# Patient Record
Sex: Female | Born: 1935 | Race: Black or African American | Hispanic: No | Marital: Married | Smoking: Former smoker
Health system: Southern US, Community
[De-identification: ages and names within clinical notes are randomized; demographics above are authoritative.]

## PROBLEM LIST (undated history)

## (undated) DIAGNOSIS — D696 Thrombocytopenia, unspecified: Secondary | ICD-10-CM

## (undated) DIAGNOSIS — I10 Essential (primary) hypertension: Secondary | ICD-10-CM

## (undated) DIAGNOSIS — D649 Anemia, unspecified: Secondary | ICD-10-CM

## (undated) DIAGNOSIS — I4891 Unspecified atrial fibrillation: Secondary | ICD-10-CM

## (undated) DIAGNOSIS — H409 Unspecified glaucoma: Secondary | ICD-10-CM

## (undated) DIAGNOSIS — D72819 Decreased white blood cell count, unspecified: Secondary | ICD-10-CM

## (undated) DIAGNOSIS — I25810 Atherosclerosis of coronary artery bypass graft(s) without angina pectoris: Secondary | ICD-10-CM

## (undated) DIAGNOSIS — Z90710 Acquired absence of both cervix and uterus: Secondary | ICD-10-CM

## (undated) DIAGNOSIS — Z9049 Acquired absence of other specified parts of digestive tract: Secondary | ICD-10-CM

## (undated) DIAGNOSIS — G20 Parkinson's disease: Secondary | ICD-10-CM

## (undated) DIAGNOSIS — E7800 Pure hypercholesterolemia, unspecified: Secondary | ICD-10-CM

## (undated) DIAGNOSIS — G20A1 Parkinson's disease without dyskinesia, without mention of fluctuations: Secondary | ICD-10-CM

## (undated) DIAGNOSIS — E039 Hypothyroidism, unspecified: Secondary | ICD-10-CM

## (undated) DIAGNOSIS — K219 Gastro-esophageal reflux disease without esophagitis: Secondary | ICD-10-CM

## (undated) DIAGNOSIS — E119 Type 2 diabetes mellitus without complications: Secondary | ICD-10-CM

## (undated) DIAGNOSIS — C9590 Leukemia, unspecified not having achieved remission: Secondary | ICD-10-CM

## (undated) DIAGNOSIS — I099 Rheumatic heart disease, unspecified: Secondary | ICD-10-CM

## (undated) HISTORY — DX: Acquired absence of both cervix and uterus: Z90.710

## (undated) HISTORY — DX: Acquired absence of other specified parts of digestive tract: Z90.49

## (undated) HISTORY — DX: Hypothyroidism, unspecified: E03.9

## (undated) HISTORY — PX: APPENDECTOMY: SHX54

## (undated) HISTORY — DX: Type 2 diabetes mellitus without complications: E11.9

## (undated) HISTORY — PX: PACEMAKER INSERTION: SHX728

## (undated) HISTORY — PX: ABDOMINAL HYSTERECTOMY: SHX81

## (undated) HISTORY — DX: Essential (primary) hypertension: I10

## (undated) HISTORY — DX: Unspecified atrial fibrillation: I48.91

## (undated) HISTORY — DX: Parkinson's disease: G20

## (undated) HISTORY — DX: Parkinson's disease without dyskinesia, without mention of fluctuations: G20.A1

## (undated) HISTORY — DX: Pure hypercholesterolemia, unspecified: E78.00

## (undated) HISTORY — PX: CARDIAC VALVE REPLACEMENT: SHX585

## (undated) HISTORY — DX: Gastro-esophageal reflux disease without esophagitis: K21.9

## (undated) HISTORY — DX: Unspecified glaucoma: H40.9

## (undated) HISTORY — DX: Atherosclerosis of coronary artery bypass graft(s) without angina pectoris: I25.810

---

## 2004-03-31 ENCOUNTER — Inpatient Hospital Stay: Payer: Self-pay | Admitting: Internal Medicine

## 2005-01-07 ENCOUNTER — Ambulatory Visit: Payer: Self-pay | Admitting: Internal Medicine

## 2005-02-26 ENCOUNTER — Observation Stay: Payer: Self-pay | Admitting: Internal Medicine

## 2006-01-20 ENCOUNTER — Ambulatory Visit: Payer: Self-pay | Admitting: Internal Medicine

## 2007-01-21 ENCOUNTER — Emergency Department: Payer: Self-pay | Admitting: Emergency Medicine

## 2007-01-21 ENCOUNTER — Other Ambulatory Visit: Payer: Self-pay

## 2007-01-25 ENCOUNTER — Ambulatory Visit: Payer: Self-pay | Admitting: Internal Medicine

## 2007-01-31 ENCOUNTER — Ambulatory Visit: Payer: Self-pay | Admitting: Internal Medicine

## 2007-08-04 ENCOUNTER — Ambulatory Visit: Payer: Self-pay | Admitting: Internal Medicine

## 2008-01-10 ENCOUNTER — Observation Stay: Payer: Self-pay | Admitting: Internal Medicine

## 2008-05-01 ENCOUNTER — Ambulatory Visit: Payer: Self-pay | Admitting: Unknown Physician Specialty

## 2008-09-27 ENCOUNTER — Inpatient Hospital Stay: Payer: Self-pay | Admitting: Internal Medicine

## 2008-09-30 ENCOUNTER — Inpatient Hospital Stay: Payer: Self-pay | Admitting: Internal Medicine

## 2009-01-03 ENCOUNTER — Encounter: Payer: Self-pay | Admitting: Internal Medicine

## 2009-01-06 ENCOUNTER — Encounter: Payer: Self-pay | Admitting: Internal Medicine

## 2009-02-06 ENCOUNTER — Encounter: Payer: Self-pay | Admitting: Internal Medicine

## 2009-02-15 ENCOUNTER — Ambulatory Visit: Payer: Self-pay | Admitting: Internal Medicine

## 2009-11-11 ENCOUNTER — Ambulatory Visit: Payer: Self-pay | Admitting: Internal Medicine

## 2010-02-18 ENCOUNTER — Ambulatory Visit: Payer: Self-pay | Admitting: Internal Medicine

## 2010-02-18 ENCOUNTER — Emergency Department: Payer: Self-pay | Admitting: Emergency Medicine

## 2010-03-03 ENCOUNTER — Ambulatory Visit: Payer: Self-pay | Admitting: General Practice

## 2010-03-07 ENCOUNTER — Inpatient Hospital Stay: Payer: Self-pay | Admitting: Internal Medicine

## 2010-03-27 ENCOUNTER — Ambulatory Visit: Payer: Self-pay | Admitting: Family

## 2010-05-08 ENCOUNTER — Ambulatory Visit: Payer: Self-pay | Admitting: Family

## 2010-09-04 ENCOUNTER — Ambulatory Visit: Payer: Self-pay | Admitting: Family

## 2010-09-10 ENCOUNTER — Emergency Department: Payer: Self-pay | Admitting: Emergency Medicine

## 2011-02-26 ENCOUNTER — Inpatient Hospital Stay: Payer: Self-pay | Admitting: Internal Medicine

## 2011-05-21 ENCOUNTER — Ambulatory Visit: Payer: Self-pay | Admitting: Internal Medicine

## 2011-06-03 ENCOUNTER — Ambulatory Visit: Payer: Self-pay | Admitting: Internal Medicine

## 2011-09-02 ENCOUNTER — Emergency Department: Payer: Self-pay | Admitting: *Deleted

## 2011-09-02 LAB — CK TOTAL AND CKMB (NOT AT ARMC): CK-MB: 1.3 ng/mL (ref 0.5–3.6)

## 2011-09-02 LAB — COMPREHENSIVE METABOLIC PANEL
Alkaline Phosphatase: 109 U/L (ref 50–136)
Anion Gap: 10 (ref 7–16)
BUN: 22 mg/dL — ABNORMAL HIGH (ref 7–18)
Calcium, Total: 9.4 mg/dL (ref 8.5–10.1)
Chloride: 105 mmol/L (ref 98–107)
Co2: 27 mmol/L (ref 21–32)
EGFR (African American): 52 — ABNORMAL LOW
Osmolality: 291 (ref 275–301)
Potassium: 3.8 mmol/L (ref 3.5–5.1)
SGOT(AST): 29 U/L (ref 15–37)
SGPT (ALT): 17 U/L
Sodium: 142 mmol/L (ref 136–145)
Total Protein: 8.6 g/dL — ABNORMAL HIGH (ref 6.4–8.2)

## 2011-09-02 LAB — CBC
HCT: 35.4 % (ref 35.0–47.0)
MCHC: 33.6 g/dL (ref 32.0–36.0)
MCV: 90 fL (ref 80–100)
Platelet: 214 10*3/uL (ref 150–440)
RBC: 3.93 10*6/uL (ref 3.80–5.20)
RDW: 17.1 % — ABNORMAL HIGH (ref 11.5–14.5)

## 2011-12-03 ENCOUNTER — Ambulatory Visit: Payer: Self-pay | Admitting: Internal Medicine

## 2012-06-07 ENCOUNTER — Ambulatory Visit: Payer: Self-pay | Admitting: Surgery

## 2012-11-08 ENCOUNTER — Ambulatory Visit: Payer: Self-pay | Admitting: Surgery

## 2013-05-16 ENCOUNTER — Ambulatory Visit: Payer: Self-pay | Admitting: Internal Medicine

## 2013-06-05 ENCOUNTER — Encounter: Payer: Self-pay | Admitting: Neurology

## 2013-06-12 ENCOUNTER — Encounter: Payer: Self-pay | Admitting: Neurology

## 2013-06-12 ENCOUNTER — Ambulatory Visit: Payer: Self-pay | Admitting: Internal Medicine

## 2013-07-04 ENCOUNTER — Inpatient Hospital Stay: Payer: Self-pay | Admitting: Internal Medicine

## 2013-07-04 LAB — CBC
HCT: 34.8 % — ABNORMAL LOW (ref 35.0–47.0)
HGB: 11.1 g/dL — AB (ref 12.0–16.0)
MCH: 29.9 pg (ref 26.0–34.0)
MCHC: 32 g/dL (ref 32.0–36.0)
MCV: 93 fL (ref 80–100)
PLATELETS: 330 10*3/uL (ref 150–440)
RBC: 3.73 10*6/uL — ABNORMAL LOW (ref 3.80–5.20)
RDW: 15.6 % — ABNORMAL HIGH (ref 11.5–14.5)
WBC: 5 10*3/uL (ref 3.6–11.0)

## 2013-07-04 LAB — COMPREHENSIVE METABOLIC PANEL
Albumin: 3.4 g/dL (ref 3.4–5.0)
Alkaline Phosphatase: 108 U/L
Anion Gap: 5 — ABNORMAL LOW (ref 7–16)
BUN: 22 mg/dL — ABNORMAL HIGH (ref 7–18)
Bilirubin,Total: 0.5 mg/dL (ref 0.2–1.0)
Calcium, Total: 9.8 mg/dL (ref 8.5–10.1)
Chloride: 95 mmol/L — ABNORMAL LOW (ref 98–107)
Co2: 34 mmol/L — ABNORMAL HIGH (ref 21–32)
Creatinine: 1.64 mg/dL — ABNORMAL HIGH (ref 0.60–1.30)
GFR CALC AF AMER: 35 — AB
GFR CALC NON AF AMER: 30 — AB
GLUCOSE: 393 mg/dL — AB (ref 65–99)
Osmolality: 288 (ref 275–301)
Potassium: 3.3 mmol/L — ABNORMAL LOW (ref 3.5–5.1)
SGOT(AST): 25 U/L (ref 15–37)
SGPT (ALT): 16 U/L (ref 12–78)
SODIUM: 134 mmol/L — AB (ref 136–145)
TOTAL PROTEIN: 7.7 g/dL (ref 6.4–8.2)

## 2013-07-04 LAB — PRO B NATRIURETIC PEPTIDE: B-Type Natriuretic Peptide: 2609 pg/mL — ABNORMAL HIGH (ref 0–450)

## 2013-07-04 LAB — TROPONIN I: Troponin-I: 0.03 ng/mL

## 2013-07-04 LAB — PROTIME-INR
INR: 3.2
Prothrombin Time: 31.5 secs — ABNORMAL HIGH (ref 11.5–14.7)

## 2013-07-05 LAB — CBC WITH DIFFERENTIAL/PLATELET
BASOS ABS: 0 10*3/uL (ref 0.0–0.1)
Basophil %: 0.2 %
Eosinophil #: 0 10*3/uL (ref 0.0–0.7)
Eosinophil %: 0 %
HCT: 32.7 % — ABNORMAL LOW (ref 35.0–47.0)
HGB: 11 g/dL — ABNORMAL LOW (ref 12.0–16.0)
LYMPHS PCT: 6.2 %
Lymphocyte #: 0.4 10*3/uL — ABNORMAL LOW (ref 1.0–3.6)
MCH: 30.8 pg (ref 26.0–34.0)
MCHC: 33.5 g/dL (ref 32.0–36.0)
MCV: 92 fL (ref 80–100)
Monocyte #: 0.1 x10 3/mm — ABNORMAL LOW (ref 0.2–0.9)
Monocyte %: 0.8 %
NEUTROS ABS: 5.9 10*3/uL (ref 1.4–6.5)
Neutrophil %: 92.8 %
Platelet: 283 10*3/uL (ref 150–440)
RBC: 3.55 10*6/uL — AB (ref 3.80–5.20)
RDW: 15.3 % — ABNORMAL HIGH (ref 11.5–14.5)
WBC: 6.4 10*3/uL (ref 3.6–11.0)

## 2013-07-05 LAB — BASIC METABOLIC PANEL
ANION GAP: 7 (ref 7–16)
BUN: 22 mg/dL — ABNORMAL HIGH (ref 7–18)
Calcium, Total: 9.8 mg/dL (ref 8.5–10.1)
Chloride: 100 mmol/L (ref 98–107)
Co2: 29 mmol/L (ref 21–32)
Creatinine: 1.3 mg/dL (ref 0.60–1.30)
EGFR (African American): 46 — ABNORMAL LOW
EGFR (Non-African Amer.): 40 — ABNORMAL LOW
Glucose: 218 mg/dL — ABNORMAL HIGH (ref 65–99)
OSMOLALITY: 282 (ref 275–301)
Potassium: 3.3 mmol/L — ABNORMAL LOW (ref 3.5–5.1)
Sodium: 136 mmol/L (ref 136–145)

## 2013-07-05 LAB — HEMOGLOBIN A1C: Hemoglobin A1C: 8.8 % — ABNORMAL HIGH (ref 4.2–6.3)

## 2013-07-05 LAB — MAGNESIUM: MAGNESIUM: 1.4 mg/dL — AB

## 2013-07-05 LAB — PROTIME-INR
INR: 3
PROTHROMBIN TIME: 30.2 s — AB (ref 11.5–14.7)

## 2013-07-06 LAB — BASIC METABOLIC PANEL
Anion Gap: 6 — ABNORMAL LOW (ref 7–16)
BUN: 29 mg/dL — ABNORMAL HIGH (ref 7–18)
CHLORIDE: 102 mmol/L (ref 98–107)
Calcium, Total: 10.2 mg/dL — ABNORMAL HIGH (ref 8.5–10.1)
Co2: 30 mmol/L (ref 21–32)
Creatinine: 1.29 mg/dL (ref 0.60–1.30)
EGFR (African American): 47 — ABNORMAL LOW
EGFR (Non-African Amer.): 40 — ABNORMAL LOW
GLUCOSE: 174 mg/dL — AB (ref 65–99)
Osmolality: 286 (ref 275–301)
Potassium: 3.3 mmol/L — ABNORMAL LOW (ref 3.5–5.1)
SODIUM: 138 mmol/L (ref 136–145)

## 2013-07-06 LAB — CBC WITH DIFFERENTIAL/PLATELET
BASOS ABS: 0 10*3/uL (ref 0.0–0.1)
Basophil %: 0.1 %
EOS ABS: 0 10*3/uL (ref 0.0–0.7)
Eosinophil %: 0.1 %
HCT: 32.7 % — ABNORMAL LOW (ref 35.0–47.0)
HGB: 10.9 g/dL — ABNORMAL LOW (ref 12.0–16.0)
LYMPHS ABS: 0.8 10*3/uL — AB (ref 1.0–3.6)
Lymphocyte %: 9.6 %
MCH: 30.8 pg (ref 26.0–34.0)
MCHC: 33.3 g/dL (ref 32.0–36.0)
MCV: 93 fL (ref 80–100)
Monocyte #: 0.4 x10 3/mm (ref 0.2–0.9)
Monocyte %: 5.1 %
NEUTROS PCT: 85.1 %
Neutrophil #: 7.5 10*3/uL — ABNORMAL HIGH (ref 1.4–6.5)
PLATELETS: 284 10*3/uL (ref 150–440)
RBC: 3.53 10*6/uL — AB (ref 3.80–5.20)
RDW: 16 % — AB (ref 11.5–14.5)
WBC: 8.8 10*3/uL (ref 3.6–11.0)

## 2013-07-06 LAB — PROTIME-INR
INR: 3.4
Prothrombin Time: 33.6 secs — ABNORMAL HIGH (ref 11.5–14.7)

## 2013-07-07 LAB — PROTIME-INR
INR: 3.8
Prothrombin Time: 36 secs — ABNORMAL HIGH (ref 11.5–14.7)

## 2013-07-07 LAB — BASIC METABOLIC PANEL
ANION GAP: 4 — AB (ref 7–16)
BUN: 21 mg/dL — ABNORMAL HIGH (ref 7–18)
CALCIUM: 10.4 mg/dL — AB (ref 8.5–10.1)
CHLORIDE: 103 mmol/L (ref 98–107)
Co2: 30 mmol/L (ref 21–32)
Creatinine: 1.18 mg/dL (ref 0.60–1.30)
EGFR (Non-African Amer.): 45 — ABNORMAL LOW
GFR CALC AF AMER: 52 — AB
Glucose: 162 mg/dL — ABNORMAL HIGH (ref 65–99)
Osmolality: 280 (ref 275–301)
POTASSIUM: 3.7 mmol/L (ref 3.5–5.1)
Sodium: 137 mmol/L (ref 136–145)

## 2013-07-09 ENCOUNTER — Encounter: Payer: Self-pay | Admitting: Neurology

## 2013-08-08 ENCOUNTER — Encounter: Payer: Self-pay | Admitting: Neurology

## 2013-08-14 LAB — CBC
HCT: 34.1 % — ABNORMAL LOW (ref 35.0–47.0)
HGB: 11.2 g/dL — ABNORMAL LOW (ref 12.0–16.0)
MCH: 31.4 pg (ref 26.0–34.0)
MCHC: 33 g/dL (ref 32.0–36.0)
MCV: 95 fL (ref 80–100)
PLATELETS: 173 10*3/uL (ref 150–440)
RBC: 3.57 10*6/uL — AB (ref 3.80–5.20)
RDW: 15.3 % — ABNORMAL HIGH (ref 11.5–14.5)
WBC: 4.5 10*3/uL (ref 3.6–11.0)

## 2013-08-14 LAB — COMPREHENSIVE METABOLIC PANEL
ANION GAP: 4 — AB (ref 7–16)
Albumin: 3.7 g/dL (ref 3.4–5.0)
Alkaline Phosphatase: 133 U/L — ABNORMAL HIGH
BUN: 38 mg/dL — ABNORMAL HIGH (ref 7–18)
Bilirubin,Total: 0.7 mg/dL (ref 0.2–1.0)
CHLORIDE: 88 mmol/L — AB (ref 98–107)
CO2: 30 mmol/L (ref 21–32)
CREATININE: 2.38 mg/dL — AB (ref 0.60–1.30)
Calcium, Total: 9.5 mg/dL (ref 8.5–10.1)
EGFR (African American): 22 — ABNORMAL LOW
GFR CALC NON AF AMER: 19 — AB
Glucose: 697 mg/dL (ref 65–99)
Osmolality: 288 (ref 275–301)
Potassium: 4.6 mmol/L (ref 3.5–5.1)
SGOT(AST): 32 U/L (ref 15–37)
SGPT (ALT): 17 U/L (ref 12–78)
Sodium: 122 mmol/L — ABNORMAL LOW (ref 136–145)
TOTAL PROTEIN: 7.3 g/dL (ref 6.4–8.2)

## 2013-08-14 LAB — TROPONIN I: Troponin-I: 0.08 ng/mL — ABNORMAL HIGH

## 2013-08-15 ENCOUNTER — Inpatient Hospital Stay: Payer: Self-pay | Admitting: Internal Medicine

## 2013-08-15 LAB — TROPONIN I
Troponin-I: 0.07 ng/mL — ABNORMAL HIGH
Troponin-I: 0.06 ng/mL — ABNORMAL HIGH

## 2013-08-15 LAB — URINALYSIS, COMPLETE
BACTERIA: NONE SEEN
BILIRUBIN, UR: NEGATIVE
BLOOD: NEGATIVE
Glucose,UR: >=500 mg/dL (ref 0–75)
Ketone: NEGATIVE
Leukocyte Esterase: NEGATIVE
NITRITE: NEGATIVE
PH: 5 (ref 4.5–8.0)
PROTEIN: NEGATIVE
RBC,UR: 4 /HPF (ref 0–5)
Specific Gravity: 1.012 (ref 1.003–1.030)
WBC UR: NONE SEEN /HPF (ref 0–5)

## 2013-08-15 LAB — CK TOTAL AND CKMB (NOT AT ARMC)
CK, TOTAL: 83 U/L
CK, Total: 87 U/L
CK-MB: 6.5 ng/mL — ABNORMAL HIGH (ref 0.5–3.6)
CK-MB: 7.5 ng/mL — AB (ref 0.5–3.6)

## 2013-08-15 LAB — PROTIME-INR
INR: 2.6
Prothrombin Time: 26.8 secs — ABNORMAL HIGH (ref 11.5–14.7)

## 2013-08-15 LAB — SEDIMENTATION RATE: Erythrocyte Sed Rate: 44 mm/hr — ABNORMAL HIGH (ref 0–30)

## 2013-08-15 LAB — CK: CK, Total: 86 U/L

## 2013-08-15 LAB — CK-MB: CK-MB: 6 ng/mL — ABNORMAL HIGH (ref 0.5–3.6)

## 2013-08-15 LAB — APTT: ACTIVATED PTT: 43.9 s — AB (ref 23.6–35.9)

## 2013-08-16 LAB — CBC WITH DIFFERENTIAL/PLATELET
BASOS PCT: 0.3 %
Basophil #: 0 10*3/uL (ref 0.0–0.1)
Eosinophil #: 0.1 10*3/uL (ref 0.0–0.7)
Eosinophil %: 2.6 %
HCT: 33 % — ABNORMAL LOW (ref 35.0–47.0)
HGB: 11.1 g/dL — AB (ref 12.0–16.0)
LYMPHS ABS: 1.1 10*3/uL (ref 1.0–3.6)
LYMPHS PCT: 23.7 %
MCH: 31.2 pg (ref 26.0–34.0)
MCHC: 33.7 g/dL (ref 32.0–36.0)
MCV: 93 fL (ref 80–100)
MONOS PCT: 5.1 %
Monocyte #: 0.2 x10 3/mm (ref 0.2–0.9)
NEUTROS ABS: 3.1 10*3/uL (ref 1.4–6.5)
Neutrophil %: 68.3 %
PLATELETS: 163 10*3/uL (ref 150–440)
RBC: 3.56 10*6/uL — ABNORMAL LOW (ref 3.80–5.20)
RDW: 14.9 % — AB (ref 11.5–14.5)
WBC: 4.6 10*3/uL (ref 3.6–11.0)

## 2013-08-16 LAB — PROTIME-INR
INR: 2.6
Prothrombin Time: 27 secs — ABNORMAL HIGH (ref 11.5–14.7)

## 2013-08-16 LAB — BASIC METABOLIC PANEL
ANION GAP: 4 — AB (ref 7–16)
BUN: 20 mg/dL — AB (ref 7–18)
CALCIUM: 9.6 mg/dL (ref 8.5–10.1)
CO2: 27 mmol/L (ref 21–32)
CREATININE: 1.31 mg/dL — AB (ref 0.60–1.30)
Chloride: 107 mmol/L (ref 98–107)
EGFR (Non-African Amer.): 40 — ABNORMAL LOW
GFR CALC AF AMER: 46 — AB
GLUCOSE: 193 mg/dL — AB (ref 65–99)
Osmolality: 284 (ref 275–301)
Potassium: 3.7 mmol/L (ref 3.5–5.1)
SODIUM: 138 mmol/L (ref 136–145)

## 2013-08-17 LAB — BASIC METABOLIC PANEL
Anion Gap: 4 — ABNORMAL LOW (ref 7–16)
BUN: 16 mg/dL (ref 7–18)
CHLORIDE: 105 mmol/L (ref 98–107)
CREATININE: 1.13 mg/dL (ref 0.60–1.30)
Calcium, Total: 9.6 mg/dL (ref 8.5–10.1)
Co2: 26 mmol/L (ref 21–32)
GFR CALC AF AMER: 55 — AB
GFR CALC NON AF AMER: 48 — AB
GLUCOSE: 209 mg/dL — AB (ref 65–99)
OSMOLALITY: 277 (ref 275–301)
Potassium: 3.6 mmol/L (ref 3.5–5.1)
Sodium: 135 mmol/L — ABNORMAL LOW (ref 136–145)

## 2013-08-17 LAB — PROTIME-INR
INR: 2.1
Prothrombin Time: 23 secs — ABNORMAL HIGH (ref 11.5–14.7)

## 2013-08-17 LAB — URINE CULTURE

## 2013-08-18 LAB — BASIC METABOLIC PANEL
ANION GAP: 5 — AB (ref 7–16)
BUN: 13 mg/dL (ref 7–18)
CO2: 27 mmol/L (ref 21–32)
Calcium, Total: 10.1 mg/dL (ref 8.5–10.1)
Chloride: 107 mmol/L (ref 98–107)
Creatinine: 0.97 mg/dL (ref 0.60–1.30)
EGFR (African American): >60
EGFR (Non-African Amer.): 57 — ABNORMAL LOW
Glucose: 127 mg/dL — ABNORMAL HIGH (ref 65–99)
Osmolality: 279 (ref 275–301)
Potassium: 4.1 mmol/L (ref 3.5–5.1)
SODIUM: 139 mmol/L (ref 136–145)

## 2013-08-18 LAB — PROTIME-INR
INR: 1.7
PROTHROMBIN TIME: 20 s — AB (ref 11.5–14.7)

## 2013-08-19 LAB — CBC WITH DIFFERENTIAL/PLATELET
Basophil #: 0 10*3/uL (ref 0.0–0.1)
Basophil %: 0.4 %
EOS PCT: 4.8 %
Eosinophil #: 0.2 10*3/uL (ref 0.0–0.7)
HCT: 32.6 % — ABNORMAL LOW (ref 35.0–47.0)
HGB: 11 g/dL — AB (ref 12.0–16.0)
Lymphocyte #: 0.6 10*3/uL — ABNORMAL LOW (ref 1.0–3.6)
Lymphocyte %: 18.5 %
MCH: 31.7 pg (ref 26.0–34.0)
MCHC: 33.7 g/dL (ref 32.0–36.0)
MCV: 94 fL (ref 80–100)
Monocyte #: 0.2 x10 3/mm (ref 0.2–0.9)
Monocyte %: 6.8 %
NEUTROS PCT: 69.5 %
Neutrophil #: 2.3 10*3/uL (ref 1.4–6.5)
PLATELETS: 151 10*3/uL (ref 150–440)
RBC: 3.47 10*6/uL — AB (ref 3.80–5.20)
RDW: 15.7 % — AB (ref 11.5–14.5)
WBC: 3.3 10*3/uL — AB (ref 3.6–11.0)

## 2013-08-19 LAB — PROTIME-INR
INR: 1.5
Prothrombin Time: 17.8 secs — ABNORMAL HIGH (ref 11.5–14.7)

## 2013-08-19 LAB — BASIC METABOLIC PANEL
Anion Gap: 7 (ref 7–16)
BUN: 13 mg/dL (ref 7–18)
CHLORIDE: 108 mmol/L — AB (ref 98–107)
Calcium, Total: 9.9 mg/dL (ref 8.5–10.1)
Co2: 24 mmol/L (ref 21–32)
Creatinine: 1.13 mg/dL (ref 0.60–1.30)
EGFR (Non-African Amer.): 48 — ABNORMAL LOW
GFR CALC AF AMER: 55 — AB
Glucose: 89 mg/dL (ref 65–99)
Osmolality: 277 (ref 275–301)
POTASSIUM: 4.3 mmol/L (ref 3.5–5.1)
Sodium: 139 mmol/L (ref 136–145)

## 2013-08-20 LAB — CULTURE, BLOOD (SINGLE)

## 2013-09-05 ENCOUNTER — Observation Stay: Payer: Self-pay | Admitting: Internal Medicine

## 2013-09-05 LAB — BASIC METABOLIC PANEL
Anion Gap: 3 — ABNORMAL LOW (ref 7–16)
BUN: 14 mg/dL (ref 7–18)
CALCIUM: 10.2 mg/dL — AB (ref 8.5–10.1)
CHLORIDE: 108 mmol/L — AB (ref 98–107)
CO2: 26 mmol/L (ref 21–32)
CREATININE: 1.09 mg/dL (ref 0.60–1.30)
EGFR (African American): 58 — ABNORMAL LOW
GFR CALC NON AF AMER: 50 — AB
Glucose: 149 mg/dL — ABNORMAL HIGH (ref 65–99)
Osmolality: 277 (ref 275–301)
Potassium: 4.1 mmol/L (ref 3.5–5.1)
Sodium: 137 mmol/L (ref 136–145)

## 2013-09-05 LAB — PROTIME-INR
INR: 2.4
PROTHROMBIN TIME: 25.7 s — AB (ref 11.5–14.7)

## 2013-09-05 LAB — PRO B NATRIURETIC PEPTIDE: B-TYPE NATIURETIC PEPTID: 2854 pg/mL — AB (ref 0–450)

## 2013-09-05 LAB — CBC
HCT: 34.8 % — ABNORMAL LOW (ref 35.0–47.0)
HGB: 11.4 g/dL — ABNORMAL LOW (ref 12.0–16.0)
MCH: 31.7 pg (ref 26.0–34.0)
MCHC: 32.8 g/dL (ref 32.0–36.0)
MCV: 97 fL (ref 80–100)
PLATELETS: 213 10*3/uL (ref 150–440)
RBC: 3.6 10*6/uL — ABNORMAL LOW (ref 3.80–5.20)
RDW: 16.1 % — ABNORMAL HIGH (ref 11.5–14.5)
WBC: 4.2 10*3/uL (ref 3.6–11.0)

## 2013-09-05 LAB — TROPONIN I
Troponin-I: <0.02 ng/mL
Troponin-I: <0.02 ng/mL
Troponin-I: <0.02 ng/mL

## 2013-09-05 LAB — CK TOTAL AND CKMB (NOT AT ARMC)
CK, TOTAL: 55 U/L
CK, Total: 53 U/L
CK-MB: 1.5 ng/mL (ref 0.5–3.6)
CK-MB: 1.3 ng/mL (ref 0.5–3.6)

## 2013-09-06 LAB — CBC WITH DIFFERENTIAL/PLATELET
Basophil #: 0 10*3/uL (ref 0.0–0.1)
Basophil %: 0.4 %
EOS PCT: 4.9 %
Eosinophil #: 0.2 10*3/uL (ref 0.0–0.7)
HCT: 27.7 % — ABNORMAL LOW (ref 35.0–47.0)
HGB: 9.1 g/dL — ABNORMAL LOW (ref 12.0–16.0)
Lymphocyte #: 0.8 10*3/uL — ABNORMAL LOW (ref 1.0–3.6)
Lymphocyte %: 23.9 %
MCH: 31.2 pg (ref 26.0–34.0)
MCHC: 32.8 g/dL (ref 32.0–36.0)
MCV: 95 fL (ref 80–100)
MONOS PCT: 6.4 %
Monocyte #: 0.2 x10 3/mm (ref 0.2–0.9)
NEUTROS ABS: 2 10*3/uL (ref 1.4–6.5)
NEUTROS PCT: 64.4 %
Platelet: 162 10*3/uL (ref 150–440)
RBC: 2.91 10*6/uL — AB (ref 3.80–5.20)
RDW: 15.6 % — ABNORMAL HIGH (ref 11.5–14.5)
WBC: 3.2 10*3/uL — ABNORMAL LOW (ref 3.6–11.0)

## 2013-09-06 LAB — BASIC METABOLIC PANEL
Anion Gap: 5 — ABNORMAL LOW (ref 7–16)
BUN: 15 mg/dL (ref 7–18)
CREATININE: 1.04 mg/dL (ref 0.60–1.30)
Calcium, Total: 9.7 mg/dL (ref 8.5–10.1)
Chloride: 106 mmol/L (ref 98–107)
Co2: 28 mmol/L (ref 21–32)
EGFR (Non-African Amer.): 53 — ABNORMAL LOW
Glucose: 118 mg/dL — ABNORMAL HIGH (ref 65–99)
OSMOLALITY: 279 (ref 275–301)
POTASSIUM: 3.5 mmol/L (ref 3.5–5.1)
Sodium: 139 mmol/L (ref 136–145)

## 2013-09-06 LAB — PROTIME-INR
INR: 2.3
PROTHROMBIN TIME: 24.9 s — AB (ref 11.5–14.7)

## 2013-09-06 LAB — MAGNESIUM: Magnesium: 1.4 mg/dL — ABNORMAL LOW

## 2013-09-07 LAB — PROTIME-INR
INR: 1.8
Prothrombin Time: 20.7 secs — ABNORMAL HIGH (ref 11.5–14.7)

## 2013-09-08 ENCOUNTER — Inpatient Hospital Stay: Payer: Self-pay | Admitting: Specialist

## 2013-09-08 LAB — COMPREHENSIVE METABOLIC PANEL
ALBUMIN: 3.6 g/dL (ref 3.4–5.0)
ANION GAP: 7 (ref 7–16)
Alkaline Phosphatase: 164 U/L — ABNORMAL HIGH
BILIRUBIN TOTAL: 2.6 mg/dL — AB (ref 0.2–1.0)
BUN: 20 mg/dL — ABNORMAL HIGH (ref 7–18)
CALCIUM: 9.8 mg/dL (ref 8.5–10.1)
CHLORIDE: 104 mmol/L (ref 98–107)
Co2: 26 mmol/L (ref 21–32)
Creatinine: 1.23 mg/dL (ref 0.60–1.30)
EGFR (African American): 50 — ABNORMAL LOW
EGFR (Non-African Amer.): 43 — ABNORMAL LOW
Glucose: 234 mg/dL — ABNORMAL HIGH (ref 65–99)
Osmolality: 284 (ref 275–301)
POTASSIUM: 4 mmol/L (ref 3.5–5.1)
SGOT(AST): 871 U/L — ABNORMAL HIGH (ref 15–37)
SGPT (ALT): 459 U/L — ABNORMAL HIGH (ref 12–78)
SODIUM: 137 mmol/L (ref 136–145)
Total Protein: 8 g/dL (ref 6.4–8.2)

## 2013-09-08 LAB — URINALYSIS, COMPLETE
Bilirubin,UR: NEGATIVE
Blood: NEGATIVE
Hyaline Cast: 2
Ketone: NEGATIVE
LEUKOCYTE ESTERASE: NEGATIVE
NITRITE: NEGATIVE
PH: 5 (ref 4.5–8.0)
RBC,UR: 1 /HPF (ref 0–5)
SPECIFIC GRAVITY: 1.013 (ref 1.003–1.030)
Squamous Epithelial: <1

## 2013-09-08 LAB — CBC
HCT: 33.6 % — ABNORMAL LOW (ref 35.0–47.0)
HGB: 11.1 g/dL — ABNORMAL LOW (ref 12.0–16.0)
MCH: 31.4 pg (ref 26.0–34.0)
MCHC: 33 g/dL (ref 32.0–36.0)
MCV: 95 fL (ref 80–100)
PLATELETS: 190 10*3/uL (ref 150–440)
RBC: 3.53 10*6/uL — ABNORMAL LOW (ref 3.80–5.20)
RDW: 15.5 % — ABNORMAL HIGH (ref 11.5–14.5)
WBC: 5.3 10*3/uL (ref 3.6–11.0)

## 2013-09-08 LAB — TROPONIN I: Troponin-I: <0.02 ng/mL

## 2013-09-08 LAB — PROTIME-INR
INR: 1.8
PROTHROMBIN TIME: 20.8 s — AB (ref 11.5–14.7)

## 2013-09-08 LAB — APTT: ACTIVATED PTT: 45 s — AB (ref 23.6–35.9)

## 2013-09-08 LAB — AMMONIA: Ammonia, Plasma: <10 mcmol/L (ref 11–32)

## 2013-09-09 LAB — PROTIME-INR
INR: 2.3
PROTHROMBIN TIME: 24.6 s — AB (ref 11.5–14.7)

## 2013-09-09 LAB — CBC WITH DIFFERENTIAL/PLATELET
BASOS ABS: 0 10*3/uL (ref 0.0–0.1)
Basophil %: 0.4 %
EOS ABS: 0 10*3/uL (ref 0.0–0.7)
Eosinophil %: 0 %
HCT: 30.6 % — AB (ref 35.0–47.0)
HGB: 10.2 g/dL — ABNORMAL LOW (ref 12.0–16.0)
LYMPHS ABS: 0.6 10*3/uL — AB (ref 1.0–3.6)
Lymphocyte %: 8.7 %
MCH: 31.4 pg (ref 26.0–34.0)
MCHC: 33.2 g/dL (ref 32.0–36.0)
MCV: 95 fL (ref 80–100)
MONO ABS: 0.4 x10 3/mm (ref 0.2–0.9)
Monocyte %: 5.5 %
NEUTROS ABS: 6.4 10*3/uL (ref 1.4–6.5)
Neutrophil %: 85.4 %
Platelet: 171 10*3/uL (ref 150–440)
RBC: 3.23 10*6/uL — AB (ref 3.80–5.20)
RDW: 15.7 % — ABNORMAL HIGH (ref 11.5–14.5)
WBC: 7.5 10*3/uL (ref 3.6–11.0)

## 2013-09-09 LAB — COMPREHENSIVE METABOLIC PANEL
ALK PHOS: 163 U/L — AB
Albumin: 3.2 g/dL — ABNORMAL LOW (ref 3.4–5.0)
Anion Gap: 7 (ref 7–16)
BUN: 24 mg/dL — ABNORMAL HIGH (ref 7–18)
Bilirubin,Total: 1.8 mg/dL — ABNORMAL HIGH (ref 0.2–1.0)
CHLORIDE: 105 mmol/L (ref 98–107)
CO2: 27 mmol/L (ref 21–32)
CREATININE: 1.41 mg/dL — AB (ref 0.60–1.30)
Calcium, Total: 9.8 mg/dL (ref 8.5–10.1)
EGFR (African American): 42 — ABNORMAL LOW
EGFR (Non-African Amer.): 36 — ABNORMAL LOW
GLUCOSE: 217 mg/dL — AB (ref 65–99)
Osmolality: 288 (ref 275–301)
Potassium: 3.8 mmol/L (ref 3.5–5.1)
SGOT(AST): 731 U/L — ABNORMAL HIGH (ref 15–37)
SGPT (ALT): 523 U/L — ABNORMAL HIGH (ref 12–78)
Sodium: 139 mmol/L (ref 136–145)
Total Protein: 7.4 g/dL (ref 6.4–8.2)

## 2013-09-09 LAB — APTT
ACTIVATED PTT: 94.3 s — AB (ref 23.6–35.9)
Activated PTT: >160 secs (ref 23.6–35.9)

## 2013-09-10 LAB — MAGNESIUM: MAGNESIUM: 1.6 mg/dL — AB

## 2013-09-10 LAB — PROTIME-INR
INR: 1.8
INR: 1.7
PROTHROMBIN TIME: 19.7 s — AB (ref 11.5–14.7)
Prothrombin Time: 20.5 secs — ABNORMAL HIGH (ref 11.5–14.7)

## 2013-09-10 LAB — CBC WITH DIFFERENTIAL/PLATELET
Basophil #: 0 10*3/uL (ref 0.0–0.1)
Basophil %: 0.3 %
EOS PCT: 0.4 %
Eosinophil #: 0 10*3/uL (ref 0.0–0.7)
HCT: 25.6 % — ABNORMAL LOW (ref 35.0–47.0)
HGB: 8.5 g/dL — ABNORMAL LOW (ref 12.0–16.0)
Lymphocyte #: 0.3 10*3/uL — ABNORMAL LOW (ref 1.0–3.6)
Lymphocyte %: 7.9 %
MCH: 31.6 pg (ref 26.0–34.0)
MCHC: 33.2 g/dL (ref 32.0–36.0)
MCV: 95 fL (ref 80–100)
Monocyte #: 0.1 x10 3/mm — ABNORMAL LOW (ref 0.2–0.9)
Monocyte %: 3.4 %
Neutrophil #: 3.3 10*3/uL (ref 1.4–6.5)
Neutrophil %: 88 %
Platelet: 123 10*3/uL — ABNORMAL LOW (ref 150–440)
RBC: 2.7 10*6/uL — ABNORMAL LOW (ref 3.80–5.20)
RDW: 16.3 % — ABNORMAL HIGH (ref 11.5–14.5)
WBC: 3.7 10*3/uL (ref 3.6–11.0)

## 2013-09-10 LAB — COMPREHENSIVE METABOLIC PANEL
ALT: 259 U/L — AB (ref 12–78)
ANION GAP: 6 — AB (ref 7–16)
AST: 147 U/L — AB (ref 15–37)
Albumin: 2.7 g/dL — ABNORMAL LOW (ref 3.4–5.0)
Alkaline Phosphatase: 108 U/L
BUN: 27 mg/dL — ABNORMAL HIGH (ref 7–18)
Bilirubin,Total: 1.1 mg/dL — ABNORMAL HIGH (ref 0.2–1.0)
CREATININE: 1.57 mg/dL — AB (ref 0.60–1.30)
Calcium, Total: 8.6 mg/dL (ref 8.5–10.1)
Chloride: 109 mmol/L — ABNORMAL HIGH (ref 98–107)
Co2: 27 mmol/L (ref 21–32)
EGFR (African American): 37 — ABNORMAL LOW
EGFR (Non-African Amer.): 32 — ABNORMAL LOW
Glucose: 99 mg/dL (ref 65–99)
Osmolality: 288 (ref 275–301)
Potassium: 2.9 mmol/L — ABNORMAL LOW (ref 3.5–5.1)
Sodium: 142 mmol/L (ref 136–145)
Total Protein: 6.4 g/dL (ref 6.4–8.2)

## 2013-09-10 LAB — POTASSIUM: Potassium: 3.2 mmol/L — ABNORMAL LOW (ref 3.5–5.1)

## 2013-09-10 LAB — HEMATOCRIT: HCT: 25.7 % — ABNORMAL LOW (ref 35.0–47.0)

## 2013-09-10 LAB — APTT: Activated PTT: 76.2 secs — ABNORMAL HIGH (ref 23.6–35.9)

## 2013-09-11 LAB — CBC WITH DIFFERENTIAL/PLATELET
BASOS PCT: 0.5 %
Basophil #: 0 10*3/uL (ref 0.0–0.1)
EOS ABS: 0 10*3/uL (ref 0.0–0.7)
EOS PCT: 1.4 %
HCT: 24.6 % — ABNORMAL LOW (ref 35.0–47.0)
HGB: 8.2 g/dL — AB (ref 12.0–16.0)
LYMPHS ABS: 0.4 10*3/uL — AB (ref 1.0–3.6)
Lymphocyte %: 12.7 %
MCH: 31.8 pg (ref 26.0–34.0)
MCHC: 33.2 g/dL (ref 32.0–36.0)
MCV: 96 fL (ref 80–100)
Monocyte #: 0.2 x10 3/mm (ref 0.2–0.9)
Monocyte %: 6.4 %
NEUTROS PCT: 79 %
Neutrophil #: 2.2 10*3/uL (ref 1.4–6.5)
Platelet: 132 10*3/uL — ABNORMAL LOW (ref 150–440)
RBC: 2.57 10*6/uL — ABNORMAL LOW (ref 3.80–5.20)
RDW: 15.5 % — AB (ref 11.5–14.5)
WBC: 2.8 10*3/uL — ABNORMAL LOW (ref 3.6–11.0)

## 2013-09-11 LAB — COMPREHENSIVE METABOLIC PANEL
ALBUMIN: 2.4 g/dL — AB (ref 3.4–5.0)
ANION GAP: 5 — AB (ref 7–16)
AST: 73 U/L — AB (ref 15–37)
Alkaline Phosphatase: 90 U/L
BUN: 21 mg/dL — ABNORMAL HIGH (ref 7–18)
Bilirubin,Total: 0.7 mg/dL (ref 0.2–1.0)
Calcium, Total: 8.5 mg/dL (ref 8.5–10.1)
Chloride: 115 mmol/L — ABNORMAL HIGH (ref 98–107)
Co2: 25 mmol/L (ref 21–32)
Creatinine: 1.24 mg/dL (ref 0.60–1.30)
EGFR (African American): 49 — ABNORMAL LOW
EGFR (Non-African Amer.): 42 — ABNORMAL LOW
Glucose: 78 mg/dL (ref 65–99)
Osmolality: 291 (ref 275–301)
Potassium: 3.4 mmol/L — ABNORMAL LOW (ref 3.5–5.1)
SGPT (ALT): 182 U/L — ABNORMAL HIGH (ref 12–78)
Sodium: 145 mmol/L (ref 136–145)
Total Protein: 6.1 g/dL — ABNORMAL LOW (ref 6.4–8.2)

## 2013-09-11 LAB — APTT
Activated PTT: 54 secs — ABNORMAL HIGH (ref 23.6–35.9)
Activated PTT: 55.9 secs — ABNORMAL HIGH (ref 23.6–35.9)
Activated PTT: 65.6 secs — ABNORMAL HIGH (ref 23.6–35.9)

## 2013-09-11 LAB — PROTIME-INR
INR: 1.5
Prothrombin Time: 18.1 secs — ABNORMAL HIGH (ref 11.5–14.7)

## 2013-09-11 LAB — MAGNESIUM: Magnesium: 2.2 mg/dL

## 2013-09-11 LAB — TSH: THYROID STIMULATING HORM: 0.325 u[IU]/mL — AB

## 2013-09-11 LAB — PHOSPHORUS: PHOSPHORUS: 1.9 mg/dL — AB (ref 2.5–4.9)

## 2013-09-12 LAB — CBC WITH DIFFERENTIAL/PLATELET
BASOS PCT: 0.4 %
Basophil #: 0 10*3/uL (ref 0.0–0.1)
EOS PCT: 5.3 %
Eosinophil #: 0.2 10*3/uL (ref 0.0–0.7)
HCT: 24.6 % — ABNORMAL LOW (ref 35.0–47.0)
HGB: 8.1 g/dL — ABNORMAL LOW (ref 12.0–16.0)
Lymphocyte #: 0.6 10*3/uL — ABNORMAL LOW (ref 1.0–3.6)
Lymphocyte %: 17.9 %
MCH: 31.5 pg (ref 26.0–34.0)
MCHC: 33 g/dL (ref 32.0–36.0)
MCV: 95 fL (ref 80–100)
MONO ABS: 0.3 x10 3/mm (ref 0.2–0.9)
Monocyte %: 8.9 %
Neutrophil #: 2.1 10*3/uL (ref 1.4–6.5)
Neutrophil %: 67.5 %
PLATELETS: 159 10*3/uL (ref 150–440)
RBC: 2.58 10*6/uL — ABNORMAL LOW (ref 3.80–5.20)
RDW: 16 % — ABNORMAL HIGH (ref 11.5–14.5)
WBC: 3.2 10*3/uL — ABNORMAL LOW (ref 3.6–11.0)

## 2013-09-12 LAB — BASIC METABOLIC PANEL
Anion Gap: 5 — ABNORMAL LOW (ref 7–16)
BUN: 18 mg/dL (ref 7–18)
CHLORIDE: 114 mmol/L — AB (ref 98–107)
Calcium, Total: 8.5 mg/dL (ref 8.5–10.1)
Co2: 23 mmol/L (ref 21–32)
Creatinine: 1.17 mg/dL (ref 0.60–1.30)
EGFR (Non-African Amer.): 45 — ABNORMAL LOW
GFR CALC AF AMER: 52 — AB
Glucose: 147 mg/dL — ABNORMAL HIGH (ref 65–99)
OSMOLALITY: 288 (ref 275–301)
Potassium: 4.3 mmol/L (ref 3.5–5.1)
SODIUM: 142 mmol/L (ref 136–145)

## 2013-09-12 LAB — APTT: ACTIVATED PTT: 92.6 s — AB (ref 23.6–35.9)

## 2013-09-12 LAB — CLOSTRIDIUM DIFFICILE(ARMC)

## 2013-09-12 LAB — URINE CULTURE

## 2013-09-13 LAB — COMPREHENSIVE METABOLIC PANEL
ALK PHOS: 81 U/L
AST: 26 U/L (ref 15–37)
Albumin: 2.3 g/dL — ABNORMAL LOW (ref 3.4–5.0)
Anion Gap: 3 — ABNORMAL LOW (ref 7–16)
BUN: 14 mg/dL (ref 7–18)
Bilirubin,Total: 0.4 mg/dL (ref 0.2–1.0)
CO2: 21 mmol/L (ref 21–32)
Calcium, Total: 9 mg/dL (ref 8.5–10.1)
Chloride: 116 mmol/L — ABNORMAL HIGH (ref 98–107)
Creatinine: 0.96 mg/dL (ref 0.60–1.30)
EGFR (Non-African Amer.): 57 — ABNORMAL LOW
GLUCOSE: 108 mg/dL — AB (ref 65–99)
Osmolality: 280 (ref 275–301)
Potassium: 5 mmol/L (ref 3.5–5.1)
SGPT (ALT): 98 U/L — ABNORMAL HIGH (ref 12–78)
Sodium: 140 mmol/L (ref 136–145)
TOTAL PROTEIN: 6.1 g/dL — AB (ref 6.4–8.2)

## 2013-09-13 LAB — CBC WITH DIFFERENTIAL/PLATELET
Basophil #: 0 10*3/uL (ref 0.0–0.1)
Basophil %: 0.4 %
EOS PCT: 4.9 %
Eosinophil #: 0.1 10*3/uL (ref 0.0–0.7)
HCT: 23.7 % — AB (ref 35.0–47.0)
HGB: 7.9 g/dL — ABNORMAL LOW (ref 12.0–16.0)
Lymphocyte #: 0.5 10*3/uL — ABNORMAL LOW (ref 1.0–3.6)
Lymphocyte %: 18.7 %
MCH: 31.9 pg (ref 26.0–34.0)
MCHC: 33.4 g/dL (ref 32.0–36.0)
MCV: 96 fL (ref 80–100)
MONO ABS: 0.3 x10 3/mm (ref 0.2–0.9)
Monocyte %: 9.3 %
NEUTROS ABS: 1.8 10*3/uL (ref 1.4–6.5)
Neutrophil %: 66.7 %
PLATELETS: 161 10*3/uL (ref 150–440)
RBC: 2.48 10*6/uL — ABNORMAL LOW (ref 3.80–5.20)
RDW: 16.2 % — AB (ref 11.5–14.5)
WBC: 2.7 10*3/uL — AB (ref 3.6–11.0)

## 2013-09-13 LAB — PROTIME-INR
INR: 1.6
PROTHROMBIN TIME: 18.3 s — AB (ref 11.5–14.7)

## 2013-09-13 LAB — CULTURE, BLOOD (SINGLE)

## 2013-09-14 LAB — PROTIME-INR
INR: 1.7
PROTHROMBIN TIME: 19.4 s — AB (ref 11.5–14.7)

## 2013-09-14 LAB — BASIC METABOLIC PANEL
Anion Gap: 3 — ABNORMAL LOW (ref 7–16)
BUN: 10 mg/dL (ref 7–18)
CHLORIDE: 115 mmol/L — AB (ref 98–107)
CREATININE: 1.01 mg/dL (ref 0.60–1.30)
Calcium, Total: 9.5 mg/dL (ref 8.5–10.1)
Co2: 23 mmol/L (ref 21–32)
EGFR (African American): >60
EGFR (Non-African Amer.): 54 — ABNORMAL LOW
Glucose: 124 mg/dL — ABNORMAL HIGH (ref 65–99)
OSMOLALITY: 282 (ref 275–301)
POTASSIUM: 4.6 mmol/L (ref 3.5–5.1)
Sodium: 141 mmol/L (ref 136–145)

## 2013-09-14 LAB — HEMATOCRIT: HCT: 23.2 % — AB (ref 35.0–47.0)

## 2013-09-15 LAB — CBC WITH DIFFERENTIAL/PLATELET
BASOS ABS: 0 10*3/uL (ref 0.0–0.1)
BASOS PCT: 0.5 %
EOS PCT: 5.5 %
Eosinophil #: 0.2 10*3/uL (ref 0.0–0.7)
HCT: 28.8 % — ABNORMAL LOW (ref 35.0–47.0)
HGB: 9.1 g/dL — ABNORMAL LOW (ref 12.0–16.0)
LYMPHS PCT: 16.6 %
Lymphocyte #: 0.5 10*3/uL — ABNORMAL LOW (ref 1.0–3.6)
MCH: 29.7 pg (ref 26.0–34.0)
MCHC: 31.6 g/dL — ABNORMAL LOW (ref 32.0–36.0)
MCV: 94 fL (ref 80–100)
MONO ABS: 0.3 x10 3/mm (ref 0.2–0.9)
MONOS PCT: 8.3 %
Neutrophil #: 2.3 10*3/uL (ref 1.4–6.5)
Neutrophil %: 69.1 %
Platelet: 233 10*3/uL (ref 150–440)
RBC: 3.07 10*6/uL — AB (ref 3.80–5.20)
RDW: 16.4 % — AB (ref 11.5–14.5)
WBC: 3.3 10*3/uL — ABNORMAL LOW (ref 3.6–11.0)

## 2013-09-15 LAB — PROTIME-INR
INR: 1.8
PROTHROMBIN TIME: 20.9 s — AB (ref 11.5–14.7)

## 2013-09-17 LAB — CULTURE, BLOOD (SINGLE)

## 2013-09-18 ENCOUNTER — Inpatient Hospital Stay: Payer: Self-pay | Admitting: Internal Medicine

## 2013-09-18 LAB — CBC
HCT: 31.2 % — AB (ref 35.0–47.0)
HGB: 10 g/dL — ABNORMAL LOW (ref 12.0–16.0)
MCH: 29.9 pg (ref 26.0–34.0)
MCHC: 32 g/dL (ref 32.0–36.0)
MCV: 94 fL (ref 80–100)
PLATELETS: 345 10*3/uL (ref 150–440)
RBC: 3.33 10*6/uL — AB (ref 3.80–5.20)
RDW: 16.4 % — AB (ref 11.5–14.5)
WBC: 5.4 10*3/uL (ref 3.6–11.0)

## 2013-09-18 LAB — BASIC METABOLIC PANEL
ANION GAP: 8 (ref 7–16)
BUN: 4 mg/dL — AB (ref 7–18)
Calcium, Total: 9.4 mg/dL (ref 8.5–10.1)
Chloride: 108 mmol/L — ABNORMAL HIGH (ref 98–107)
Co2: 24 mmol/L (ref 21–32)
Creatinine: 0.8 mg/dL (ref 0.60–1.30)
EGFR (African American): >60
EGFR (Non-African Amer.): >60
GLUCOSE: 106 mg/dL — AB (ref 65–99)
Osmolality: 277 (ref 275–301)
POTASSIUM: 3.7 mmol/L (ref 3.5–5.1)
SODIUM: 140 mmol/L (ref 136–145)

## 2013-09-18 LAB — TROPONIN I
TROPONIN-I: 1.4 ng/mL — AB
TROPONIN-I: 1.3 ng/mL — AB
Troponin-I: 0.17 ng/mL — ABNORMAL HIGH

## 2013-09-18 LAB — CK-MB
CK-MB: 4.6 ng/mL — ABNORMAL HIGH (ref 0.5–3.6)
CK-MB: 5.3 ng/mL — ABNORMAL HIGH (ref 0.5–3.6)

## 2013-09-18 LAB — PROTIME-INR
INR: 2.1
Prothrombin Time: 22.7 secs — ABNORMAL HIGH (ref 11.5–14.7)

## 2013-09-18 LAB — TSH: Thyroid Stimulating Horm: 6.11 u[IU]/mL — ABNORMAL HIGH

## 2013-09-18 LAB — MAGNESIUM: MAGNESIUM: 1.7 mg/dL — AB

## 2013-09-19 LAB — CBC WITH DIFFERENTIAL/PLATELET
BASOS PCT: 0.3 %
Basophil #: 0 10*3/uL (ref 0.0–0.1)
Eosinophil #: 0.1 10*3/uL (ref 0.0–0.7)
Eosinophil %: 3 %
HCT: 31.1 % — AB (ref 35.0–47.0)
HGB: 9.8 g/dL — ABNORMAL LOW (ref 12.0–16.0)
LYMPHS PCT: 11.9 %
Lymphocyte #: 0.5 10*3/uL — ABNORMAL LOW (ref 1.0–3.6)
MCH: 29.7 pg (ref 26.0–34.0)
MCHC: 31.7 g/dL — ABNORMAL LOW (ref 32.0–36.0)
MCV: 94 fL (ref 80–100)
MONO ABS: 0.3 x10 3/mm (ref 0.2–0.9)
MONOS PCT: 7.4 %
NEUTROS ABS: 3.3 10*3/uL (ref 1.4–6.5)
Neutrophil %: 77.4 %
Platelet: 334 10*3/uL (ref 150–440)
RBC: 3.31 10*6/uL — AB (ref 3.80–5.20)
RDW: 16.8 % — AB (ref 11.5–14.5)
WBC: 4.2 10*3/uL (ref 3.6–11.0)

## 2013-09-19 LAB — HEPATIC FUNCTION PANEL A (ARMC)
AST: 23 U/L (ref 15–37)
Albumin: 2.6 g/dL — ABNORMAL LOW (ref 3.4–5.0)
Alkaline Phosphatase: 81 U/L
Bilirubin, Direct: 0.1 mg/dL (ref 0.00–0.20)
Bilirubin,Total: 0.4 mg/dL (ref 0.2–1.0)
SGPT (ALT): 33 U/L (ref 12–78)
TOTAL PROTEIN: 6.3 g/dL — AB (ref 6.4–8.2)

## 2013-09-19 LAB — PROTIME-INR
INR: 2.3
Prothrombin Time: 24.4 secs — ABNORMAL HIGH (ref 11.5–14.7)

## 2013-09-19 LAB — BASIC METABOLIC PANEL
Anion Gap: 5 — ABNORMAL LOW (ref 7–16)
BUN: 4 mg/dL — AB (ref 7–18)
CALCIUM: 9.3 mg/dL (ref 8.5–10.1)
Chloride: 109 mmol/L — ABNORMAL HIGH (ref 98–107)
Co2: 27 mmol/L (ref 21–32)
Creatinine: 0.76 mg/dL (ref 0.60–1.30)
EGFR (African American): >60
Glucose: 58 mg/dL — ABNORMAL LOW (ref 65–99)
OSMOLALITY: 276 (ref 275–301)
POTASSIUM: 3.7 mmol/L (ref 3.5–5.1)
Sodium: 141 mmol/L (ref 136–145)

## 2013-09-19 LAB — MAGNESIUM: Magnesium: 2 mg/dL

## 2013-09-20 LAB — HEMATOCRIT: HCT: 28.8 % — ABNORMAL LOW (ref 35.0–47.0)

## 2013-09-20 LAB — PROTIME-INR
INR: 2.5
PROTHROMBIN TIME: 26.2 s — AB (ref 11.5–14.7)

## 2013-09-21 LAB — PROTIME-INR
INR: 2.6
PROTHROMBIN TIME: 27.2 s — AB (ref 11.5–14.7)

## 2013-09-21 LAB — HEMATOCRIT: HCT: 29.5 % — AB (ref 35.0–47.0)

## 2013-09-22 ENCOUNTER — Encounter: Payer: Self-pay | Admitting: Neurology

## 2013-09-22 ENCOUNTER — Encounter: Payer: Self-pay | Admitting: Internal Medicine

## 2013-09-24 LAB — CULTURE, BLOOD (SINGLE)

## 2013-09-26 LAB — CBC WITH DIFFERENTIAL/PLATELET
BASOS PCT: 0.5 %
Basophil #: 0 10*3/uL (ref 0.0–0.1)
EOS ABS: 0.2 10*3/uL (ref 0.0–0.7)
Eosinophil %: 3.3 %
HCT: 33.3 % — AB (ref 35.0–47.0)
HGB: 10.7 g/dL — ABNORMAL LOW (ref 12.0–16.0)
LYMPHS ABS: 0.9 10*3/uL — AB (ref 1.0–3.6)
Lymphocyte %: 18.2 %
MCH: 30.6 pg (ref 26.0–34.0)
MCHC: 32 g/dL (ref 32.0–36.0)
MCV: 96 fL (ref 80–100)
Monocyte #: 0.3 x10 3/mm (ref 0.2–0.9)
Monocyte %: 6.1 %
Neutrophil #: 3.5 10*3/uL (ref 1.4–6.5)
Neutrophil %: 71.9 %
PLATELETS: 317 10*3/uL (ref 150–440)
RBC: 3.49 10*6/uL — AB (ref 3.80–5.20)
RDW: 17.7 % — ABNORMAL HIGH (ref 11.5–14.5)
WBC: 4.9 10*3/uL (ref 3.6–11.0)

## 2013-09-26 LAB — HEMOGLOBIN A1C: HEMOGLOBIN A1C: 9.3 % — AB (ref 4.2–6.3)

## 2013-09-26 LAB — BASIC METABOLIC PANEL
Anion Gap: 4 — ABNORMAL LOW (ref 7–16)
BUN: 8 mg/dL (ref 7–18)
CALCIUM: 10.1 mg/dL (ref 8.5–10.1)
Chloride: 109 mmol/L — ABNORMAL HIGH (ref 98–107)
Co2: 28 mmol/L (ref 21–32)
Creatinine: 0.83 mg/dL (ref 0.60–1.30)
EGFR (African American): >60
GLUCOSE: 88 mg/dL (ref 65–99)
Osmolality: 279 (ref 275–301)
POTASSIUM: 3.9 mmol/L (ref 3.5–5.1)
Sodium: 141 mmol/L (ref 136–145)

## 2013-09-26 LAB — PROTIME-INR
INR: 2
PROTHROMBIN TIME: 22.2 s — AB (ref 11.5–14.7)

## 2013-10-03 LAB — PROTIME-INR
INR: 3.1
PROTHROMBIN TIME: 31 s — AB (ref 11.5–14.7)

## 2013-10-06 ENCOUNTER — Encounter: Payer: Self-pay | Admitting: Internal Medicine

## 2013-10-10 ENCOUNTER — Emergency Department: Payer: Self-pay | Admitting: Emergency Medicine

## 2013-10-10 LAB — PROTIME-INR
INR: 2.6
Prothrombin Time: 26.8 secs — ABNORMAL HIGH (ref 11.5–14.7)

## 2013-10-10 LAB — BASIC METABOLIC PANEL
ANION GAP: 2 — AB (ref 7–16)
BUN: 9 mg/dL (ref 7–18)
Calcium, Total: 10.1 mg/dL (ref 8.5–10.1)
Chloride: 110 mmol/L — ABNORMAL HIGH (ref 98–107)
Co2: 25 mmol/L (ref 21–32)
Creatinine: 0.68 mg/dL (ref 0.60–1.30)
GLUCOSE: 96 mg/dL (ref 65–99)
Osmolality: 272 (ref 275–301)
POTASSIUM: 4 mmol/L (ref 3.5–5.1)
Sodium: 137 mmol/L (ref 136–145)

## 2013-10-10 LAB — CBC
HCT: 35 % (ref 35.0–47.0)
HGB: 11.3 g/dL — AB (ref 12.0–16.0)
MCH: 30.7 pg (ref 26.0–34.0)
MCHC: 32.4 g/dL (ref 32.0–36.0)
MCV: 95 fL (ref 80–100)
Platelet: 228 10*3/uL (ref 150–440)
RBC: 3.68 10*6/uL — ABNORMAL LOW (ref 3.80–5.20)
RDW: 17.8 % — ABNORMAL HIGH (ref 11.5–14.5)
WBC: 3.9 10*3/uL (ref 3.6–11.0)

## 2013-10-10 LAB — TROPONIN I: Troponin-I: <0.02 ng/mL

## 2013-10-10 LAB — MAGNESIUM: Magnesium: 1.6 mg/dL — ABNORMAL LOW

## 2013-10-10 LAB — TSH: Thyroid Stimulating Horm: 0.532 u[IU]/mL

## 2013-10-10 LAB — PRO B NATRIURETIC PEPTIDE: B-Type Natriuretic Peptide: 1705 pg/mL — ABNORMAL HIGH (ref 0–450)

## 2013-10-17 ENCOUNTER — Inpatient Hospital Stay: Payer: Self-pay | Admitting: Internal Medicine

## 2013-10-17 LAB — APTT: ACTIVATED PTT: 35.9 s (ref 23.6–35.9)

## 2013-10-17 LAB — CBC WITH DIFFERENTIAL/PLATELET
BASOS ABS: 0 10*3/uL (ref 0.0–0.1)
Basophil %: 0.3 %
Eosinophil #: 0.1 10*3/uL (ref 0.0–0.7)
Eosinophil %: 2.4 %
HCT: 31.3 % — ABNORMAL LOW (ref 35.0–47.0)
HGB: 10.3 g/dL — ABNORMAL LOW (ref 12.0–16.0)
LYMPHS ABS: 0.6 10*3/uL — AB (ref 1.0–3.6)
Lymphocyte %: 16.3 %
MCH: 31.1 pg (ref 26.0–34.0)
MCHC: 32.8 g/dL (ref 32.0–36.0)
MCV: 95 fL (ref 80–100)
MONO ABS: 0.2 x10 3/mm (ref 0.2–0.9)
Monocyte %: 5.6 %
NEUTROS ABS: 2.7 10*3/uL (ref 1.4–6.5)
NEUTROS PCT: 75.4 %
Platelet: 269 10*3/uL (ref 150–440)
RBC: 3.31 10*6/uL — ABNORMAL LOW (ref 3.80–5.20)
RDW: 16.8 % — ABNORMAL HIGH (ref 11.5–14.5)
WBC: 3.6 10*3/uL (ref 3.6–11.0)

## 2013-10-17 LAB — BASIC METABOLIC PANEL
ANION GAP: 6 — AB (ref 7–16)
BUN: 14 mg/dL (ref 7–18)
CREATININE: 0.98 mg/dL (ref 0.60–1.30)
Calcium, Total: 10 mg/dL (ref 8.5–10.1)
Chloride: 107 mmol/L (ref 98–107)
Co2: 27 mmol/L (ref 21–32)
EGFR (African American): >60
EGFR (Non-African Amer.): 56 — ABNORMAL LOW
Glucose: 170 mg/dL — ABNORMAL HIGH (ref 65–99)
Osmolality: 284 (ref 275–301)
POTASSIUM: 4.2 mmol/L (ref 3.5–5.1)
SODIUM: 140 mmol/L (ref 136–145)

## 2013-10-17 LAB — PROTIME-INR
INR: 1.5
INR: 1.4
PROTHROMBIN TIME: 18.1 s — AB (ref 11.5–14.7)
Prothrombin Time: 17.1 secs — ABNORMAL HIGH (ref 11.5–14.7)

## 2013-10-18 LAB — CBC WITH DIFFERENTIAL/PLATELET
Basophil #: 0 10*3/uL (ref 0.0–0.1)
Basophil %: 0.7 %
EOS ABS: 0.1 10*3/uL (ref 0.0–0.7)
Eosinophil %: 3.6 %
HCT: 27.5 % — ABNORMAL LOW (ref 35.0–47.0)
HGB: 9 g/dL — ABNORMAL LOW (ref 12.0–16.0)
Lymphocyte #: 0.8 10*3/uL — ABNORMAL LOW (ref 1.0–3.6)
Lymphocyte %: 22.3 %
MCH: 30.9 pg (ref 26.0–34.0)
MCHC: 32.8 g/dL (ref 32.0–36.0)
MCV: 94 fL (ref 80–100)
Monocyte #: 0.3 x10 3/mm (ref 0.2–0.9)
Monocyte %: 7.7 %
NEUTROS PCT: 65.7 %
Neutrophil #: 2.4 10*3/uL (ref 1.4–6.5)
Platelet: 238 10*3/uL (ref 150–440)
RBC: 2.92 10*6/uL — ABNORMAL LOW (ref 3.80–5.20)
RDW: 16.9 % — AB (ref 11.5–14.5)
WBC: 3.7 10*3/uL (ref 3.6–11.0)

## 2013-10-18 LAB — BASIC METABOLIC PANEL
ANION GAP: 3 — AB (ref 7–16)
BUN: 16 mg/dL (ref 7–18)
CALCIUM: 9.8 mg/dL (ref 8.5–10.1)
CO2: 27 mmol/L (ref 21–32)
Chloride: 109 mmol/L — ABNORMAL HIGH (ref 98–107)
Creatinine: 1.11 mg/dL (ref 0.60–1.30)
EGFR (African American): 56 — ABNORMAL LOW
GFR CALC NON AF AMER: 48 — AB
GLUCOSE: 89 mg/dL (ref 65–99)
Osmolality: 278 (ref 275–301)
Potassium: 4.1 mmol/L (ref 3.5–5.1)
Sodium: 139 mmol/L (ref 136–145)

## 2013-10-18 LAB — PROTIME-INR
INR: 1.4
Prothrombin Time: 16.9 secs — ABNORMAL HIGH (ref 11.5–14.7)

## 2013-10-18 LAB — APTT
ACTIVATED PTT: 135.2 s — AB (ref 23.6–35.9)
Activated PTT: 73.4 secs — ABNORMAL HIGH (ref 23.6–35.9)

## 2013-10-19 LAB — PROTIME-INR
INR: 1.3
Prothrombin Time: 16 secs — ABNORMAL HIGH (ref 11.5–14.7)

## 2013-10-19 LAB — APTT
ACTIVATED PTT: 36 s — AB (ref 23.6–35.9)
Activated PTT: 70.6 secs — ABNORMAL HIGH (ref 23.6–35.9)

## 2013-10-20 LAB — APTT: Activated PTT: 70.2 secs — ABNORMAL HIGH (ref 23.6–35.9)

## 2013-10-20 LAB — PROTIME-INR
INR: 1.2
PROTHROMBIN TIME: 15.3 s — AB (ref 11.5–14.7)

## 2013-10-21 LAB — APTT
Activated PTT: >160 secs (ref 23.6–35.9)
Activated PTT: 61.3 secs — ABNORMAL HIGH (ref 23.6–35.9)
Activated PTT: 52 secs — ABNORMAL HIGH (ref 23.6–35.9)

## 2013-10-21 LAB — PROTIME-INR
INR: 1.2
Prothrombin Time: 15.2 secs — ABNORMAL HIGH (ref 11.5–14.7)

## 2013-10-21 LAB — HEMOGLOBIN: HGB: 10.2 g/dL — ABNORMAL LOW (ref 12.0–16.0)

## 2013-10-21 LAB — PLATELET COUNT: Platelet: 255 10*3/uL (ref 150–440)

## 2013-10-22 LAB — APTT
ACTIVATED PTT: 80.3 s — AB (ref 23.6–35.9)
Activated PTT: 67.2 secs — ABNORMAL HIGH (ref 23.6–35.9)

## 2013-10-22 LAB — PROTIME-INR
INR: 1.3
Prothrombin Time: 15.7 secs — ABNORMAL HIGH (ref 11.5–14.7)

## 2013-10-23 LAB — PROTIME-INR
INR: 1.5
PROTHROMBIN TIME: 17.8 s — AB (ref 11.5–14.7)

## 2013-10-23 LAB — APTT
Activated PTT: 119.1 secs — ABNORMAL HIGH (ref 23.6–35.9)
Activated PTT: 91.9 secs — ABNORMAL HIGH (ref 23.6–35.9)

## 2013-10-23 LAB — HEMOGLOBIN: HGB: 10.8 g/dL — ABNORMAL LOW (ref 12.0–16.0)

## 2013-10-23 LAB — PLATELET COUNT: PLATELETS: 235 10*3/uL (ref 150–440)

## 2013-10-24 LAB — PROTIME-INR
INR: 1.8
PROTHROMBIN TIME: 20.9 s — AB (ref 11.5–14.7)

## 2013-10-24 LAB — APTT
ACTIVATED PTT: 117.6 s — AB (ref 23.6–35.9)
Activated PTT: 81.9 secs — ABNORMAL HIGH (ref 23.6–35.9)

## 2014-06-08 ENCOUNTER — Ambulatory Visit: Payer: Self-pay | Admitting: Oncology

## 2014-06-10 ENCOUNTER — Inpatient Hospital Stay: Payer: Self-pay | Admitting: Internal Medicine

## 2014-06-10 LAB — BASIC METABOLIC PANEL
Anion Gap: 7 (ref 7–16)
BUN: 20 mg/dL — AB (ref 7–18)
CHLORIDE: 102 mmol/L (ref 98–107)
CREATININE: 1.21 mg/dL (ref 0.60–1.30)
Calcium, Total: 9.1 mg/dL (ref 8.5–10.1)
Co2: 32 mmol/L (ref 21–32)
EGFR (African American): 56 — ABNORMAL LOW
GFR CALC NON AF AMER: 46 — AB
Glucose: 112 mg/dL — ABNORMAL HIGH (ref 65–99)
Osmolality: 285 (ref 275–301)
POTASSIUM: 3.6 mmol/L (ref 3.5–5.1)
SODIUM: 141 mmol/L (ref 136–145)

## 2014-06-10 LAB — CBC WITH DIFFERENTIAL/PLATELET
BASOS ABS: 0 10*3/uL (ref 0.0–0.1)
Basophil %: 0.3 %
EOS ABS: 0.1 10*3/uL (ref 0.0–0.7)
Eosinophil %: 2.2 %
HCT: 24.8 % — AB (ref 35.0–47.0)
HGB: 7.8 g/dL — ABNORMAL LOW (ref 12.0–16.0)
LYMPHS ABS: 0.7 10*3/uL — AB (ref 1.0–3.6)
Lymphocyte %: 22.1 %
MCH: 29.1 pg (ref 26.0–34.0)
MCHC: 31.6 g/dL — AB (ref 32.0–36.0)
MCV: 92 fL (ref 80–100)
MONO ABS: 0.2 x10 3/mm (ref 0.2–0.9)
Monocyte %: 4.9 %
NEUTROS ABS: 2.2 10*3/uL (ref 1.4–6.5)
Neutrophil %: 70.5 %
PLATELETS: 227 10*3/uL (ref 150–440)
RBC: 2.69 10*6/uL — ABNORMAL LOW (ref 3.80–5.20)
RDW: 15.4 % — ABNORMAL HIGH (ref 11.5–14.5)
WBC: 3.1 10*3/uL — AB (ref 3.6–11.0)

## 2014-06-10 LAB — PROTIME-INR
INR: 3.1
Prothrombin Time: 31.3 secs — ABNORMAL HIGH (ref 11.5–14.7)

## 2014-06-10 LAB — URINALYSIS, COMPLETE
BACTERIA: NONE SEEN
Bilirubin,UR: NEGATIVE
Blood: NEGATIVE
Glucose,UR: NEGATIVE mg/dL (ref 0–75)
KETONE: NEGATIVE
Leukocyte Esterase: NEGATIVE
Nitrite: NEGATIVE
Ph: 5 (ref 4.5–8.0)
Protein: NEGATIVE
RBC,UR: <1 /HPF (ref 0–5)
SPECIFIC GRAVITY: 1.013 (ref 1.003–1.030)
Squamous Epithelial: 3
WBC UR: 1 /HPF (ref 0–5)

## 2014-06-10 LAB — TROPONIN I: Troponin-I: <0.02 ng/mL

## 2014-06-11 LAB — BASIC METABOLIC PANEL
ANION GAP: 7 (ref 7–16)
BUN: 20 mg/dL — ABNORMAL HIGH (ref 7–18)
CHLORIDE: 106 mmol/L (ref 98–107)
CO2: 32 mmol/L (ref 21–32)
CREATININE: 0.97 mg/dL (ref 0.60–1.30)
Calcium, Total: 8.9 mg/dL (ref 8.5–10.1)
EGFR (Non-African Amer.): 59 — ABNORMAL LOW
Glucose: 87 mg/dL (ref 65–99)
Osmolality: 291 (ref 275–301)
Potassium: 3.2 mmol/L — ABNORMAL LOW (ref 3.5–5.1)
SODIUM: 145 mmol/L (ref 136–145)

## 2014-06-11 LAB — CBC WITH DIFFERENTIAL/PLATELET
Basophil #: 0 10*3/uL (ref 0.0–0.1)
Basophil %: 0 %
EOS PCT: 1.3 %
Eosinophil #: 0 10*3/uL (ref 0.0–0.7)
HCT: 25 % — ABNORMAL LOW (ref 35.0–47.0)
HGB: 8.1 g/dL — ABNORMAL LOW (ref 12.0–16.0)
LYMPHS ABS: 0.7 10*3/uL — AB (ref 1.0–3.6)
LYMPHS PCT: 25.4 %
MCH: 29.3 pg (ref 26.0–34.0)
MCHC: 32.2 g/dL (ref 32.0–36.0)
MCV: 91 fL (ref 80–100)
MONOS PCT: 7 %
Monocyte #: 0.2 x10 3/mm (ref 0.2–0.9)
NEUTROS ABS: 1.8 10*3/uL (ref 1.4–6.5)
Neutrophil %: 66.3 %
Platelet: 186 10*3/uL (ref 150–440)
RBC: 2.75 10*6/uL — ABNORMAL LOW (ref 3.80–5.20)
RDW: 15.3 % — ABNORMAL HIGH (ref 11.5–14.5)
WBC: 2.7 10*3/uL — AB (ref 3.6–11.0)

## 2014-06-11 LAB — PROTIME-INR
INR: 3.1
PROTHROMBIN TIME: 31.4 s — AB (ref 11.5–14.7)

## 2014-06-11 LAB — MAGNESIUM: Magnesium: 1.8 mg/dL

## 2014-06-12 LAB — CBC WITH DIFFERENTIAL/PLATELET
Basophil #: 0 10*3/uL (ref 0.0–0.1)
Basophil %: 0.3 %
Eosinophil #: 0.1 10*3/uL (ref 0.0–0.7)
Eosinophil %: 4.4 %
HCT: 23.5 % — ABNORMAL LOW (ref 35.0–47.0)
HGB: 7.9 g/dL — ABNORMAL LOW (ref 12.0–16.0)
Lymphocyte #: 0.9 10*3/uL — ABNORMAL LOW (ref 1.0–3.6)
Lymphocyte %: 32.1 %
MCH: 29.9 pg (ref 26.0–34.0)
MCHC: 33.5 g/dL (ref 32.0–36.0)
MCV: 89 fL (ref 80–100)
Monocyte #: 0.1 x10 3/mm — ABNORMAL LOW (ref 0.2–0.9)
Monocyte %: 5.1 %
NEUTROS ABS: 1.7 10*3/uL (ref 1.4–6.5)
Neutrophil %: 58.1 %
Platelet: 191 10*3/uL (ref 150–440)
RBC: 2.64 10*6/uL — ABNORMAL LOW (ref 3.80–5.20)
RDW: 15.9 % — ABNORMAL HIGH (ref 11.5–14.5)
WBC: 2.9 10*3/uL — ABNORMAL LOW (ref 3.6–11.0)

## 2014-06-12 LAB — BASIC METABOLIC PANEL
Anion Gap: 7 (ref 7–16)
BUN: 17 mg/dL (ref 7–18)
CO2: 30 mmol/L (ref 21–32)
CREATININE: 1.16 mg/dL (ref 0.60–1.30)
Calcium, Total: 9 mg/dL (ref 8.5–10.1)
Chloride: 107 mmol/L (ref 98–107)
EGFR (African American): 59 — ABNORMAL LOW
GFR CALC NON AF AMER: 48 — AB
GLUCOSE: 94 mg/dL (ref 65–99)
Osmolality: 288 (ref 275–301)
Potassium: 3.7 mmol/L (ref 3.5–5.1)
Sodium: 144 mmol/L (ref 136–145)

## 2014-06-12 LAB — PROTIME-INR
INR: 3
PROTHROMBIN TIME: 30.5 s — AB (ref 11.5–14.7)

## 2014-06-13 LAB — CBC WITH DIFFERENTIAL/PLATELET
BASOS PCT: 0.2 %
Basophil #: 0 10*3/uL (ref 0.0–0.1)
EOS PCT: 5.6 %
Eosinophil #: 0.1 10*3/uL (ref 0.0–0.7)
HCT: 22.3 % — AB (ref 35.0–47.0)
HGB: 7.3 g/dL — AB (ref 12.0–16.0)
LYMPHS PCT: 31 %
Lymphocyte #: 0.8 10*3/uL — ABNORMAL LOW (ref 1.0–3.6)
MCH: 29.5 pg (ref 26.0–34.0)
MCHC: 32.5 g/dL (ref 32.0–36.0)
MCV: 91 fL (ref 80–100)
Monocyte #: 0.1 x10 3/mm — ABNORMAL LOW (ref 0.2–0.9)
Monocyte %: 5.2 %
NEUTROS ABS: 1.4 10*3/uL (ref 1.4–6.5)
Neutrophil %: 58 %
Platelet: 173 10*3/uL (ref 150–440)
RBC: 2.46 10*6/uL — ABNORMAL LOW (ref 3.80–5.20)
RDW: 15.5 % — ABNORMAL HIGH (ref 11.5–14.5)
WBC: 2.5 10*3/uL — ABNORMAL LOW (ref 3.6–11.0)

## 2014-06-13 LAB — PROTIME-INR
INR: 2
PROTHROMBIN TIME: 22.5 s — AB (ref 11.5–14.7)

## 2014-06-14 LAB — CBC WITH DIFFERENTIAL/PLATELET
BASOS ABS: 0 10*3/uL (ref 0.0–0.1)
Basophil %: 0.4 %
Eosinophil #: 0.1 10*3/uL (ref 0.0–0.7)
Eosinophil %: 4.9 %
HCT: 23.8 % — AB (ref 35.0–47.0)
HGB: 7.7 g/dL — ABNORMAL LOW (ref 12.0–16.0)
LYMPHS ABS: 0.6 10*3/uL — AB (ref 1.0–3.6)
Lymphocyte %: 18.8 %
MCH: 29.8 pg (ref 26.0–34.0)
MCHC: 32.5 g/dL (ref 32.0–36.0)
MCV: 92 fL (ref 80–100)
MONO ABS: 0.1 x10 3/mm — AB (ref 0.2–0.9)
MONOS PCT: 3.3 %
Neutrophil #: 2.1 10*3/uL (ref 1.4–6.5)
Neutrophil %: 72.6 %
Platelet: 206 10*3/uL (ref 150–440)
RBC: 2.59 10*6/uL — ABNORMAL LOW (ref 3.80–5.20)
RDW: 15 % — AB (ref 11.5–14.5)
WBC: 2.9 10*3/uL — AB (ref 3.6–11.0)

## 2014-06-15 LAB — CBC WITH DIFFERENTIAL/PLATELET
BASOS PCT: 0.3 %
Basophil #: 0 10*3/uL (ref 0.0–0.1)
EOS ABS: 0.1 10*3/uL (ref 0.0–0.7)
Eosinophil %: 4.1 %
HCT: 20.6 % — ABNORMAL LOW (ref 35.0–47.0)
HGB: 6.6 g/dL — ABNORMAL LOW (ref 12.0–16.0)
Lymphocyte #: 0.7 10*3/uL — ABNORMAL LOW (ref 1.0–3.6)
Lymphocyte %: 28.5 %
MCH: 29.1 pg (ref 26.0–34.0)
MCHC: 32 g/dL (ref 32.0–36.0)
MCV: 91 fL (ref 80–100)
MONO ABS: 0.1 x10 3/mm — AB (ref 0.2–0.9)
Monocyte %: 4.7 %
NEUTROS PCT: 62.4 %
Neutrophil #: 1.4 10*3/uL (ref 1.4–6.5)
Platelet: 154 10*3/uL (ref 150–440)
RBC: 2.27 10*6/uL — ABNORMAL LOW (ref 3.80–5.20)
RDW: 15.3 % — ABNORMAL HIGH (ref 11.5–14.5)
WBC: 2.3 10*3/uL — AB (ref 3.6–11.0)

## 2014-06-15 LAB — TROPONIN I: Troponin-I: 0.02 ng/mL

## 2014-06-15 LAB — PROTIME-INR
INR: 1.5
PROTHROMBIN TIME: 17.7 s — AB (ref 11.5–14.7)

## 2014-06-15 LAB — HEMOGLOBIN: HGB: 8.1 g/dL — ABNORMAL LOW (ref 12.0–16.0)

## 2014-06-15 LAB — RAPID HIV SCREEN (HIV 1/2 AB+AG)

## 2014-06-16 LAB — CBC WITH DIFFERENTIAL/PLATELET
Basophil #: 0 10*3/uL (ref 0.0–0.1)
Basophil %: 0.2 %
EOS ABS: 0.1 10*3/uL (ref 0.0–0.7)
Eosinophil %: 4.3 %
HCT: 23.2 % — ABNORMAL LOW (ref 35.0–47.0)
HGB: 7.6 g/dL — ABNORMAL LOW (ref 12.0–16.0)
Lymphocyte #: 0.9 10*3/uL — ABNORMAL LOW (ref 1.0–3.6)
Lymphocyte %: 35.5 %
MCH: 28.7 pg (ref 26.0–34.0)
MCHC: 32.6 g/dL (ref 32.0–36.0)
MCV: 88 fL (ref 80–100)
Monocyte #: 0.1 x10 3/mm — ABNORMAL LOW (ref 0.2–0.9)
Monocyte %: 4.5 %
NEUTROS PCT: 55.5 %
Neutrophil #: 1.4 10*3/uL (ref 1.4–6.5)
PLATELETS: 151 10*3/uL (ref 150–440)
RBC: 2.64 10*6/uL — AB (ref 3.80–5.20)
RDW: 16.6 % — ABNORMAL HIGH (ref 11.5–14.5)
WBC: 2.5 10*3/uL — ABNORMAL LOW (ref 3.6–11.0)

## 2014-06-16 LAB — CREATININE, SERUM
Creatinine: 1.09 mg/dL (ref 0.60–1.30)
EGFR (African American): >60
EGFR (Non-African Amer.): 52 — ABNORMAL LOW

## 2014-06-16 LAB — LACTATE DEHYDROGENASE: LDH: 167 U/L (ref 81–246)

## 2014-06-16 LAB — PROTIME-INR
INR: 1.6
PROTHROMBIN TIME: 18.3 s — AB (ref 11.5–14.7)

## 2014-06-16 LAB — IRON AND TIBC
IRON SATURATION: 28 %
Iron Bind.Cap.(Total): 197 ug/dL — ABNORMAL LOW (ref 250–450)
Iron: 56 ug/dL (ref 50–170)
UNBOUND IRON-BIND. CAP.: 141 ug/dL

## 2014-06-16 LAB — FERRITIN: FERRITIN (ARMC): 166 ng/mL (ref 8–388)

## 2014-06-17 LAB — CBC WITH DIFFERENTIAL/PLATELET
BASOS ABS: 0 10*3/uL (ref 0.0–0.1)
Basophil %: 0.3 %
Eosinophil #: 0.1 10*3/uL (ref 0.0–0.7)
Eosinophil %: 3.9 %
HCT: 22.4 % — ABNORMAL LOW (ref 35.0–47.0)
HGB: 7.3 g/dL — AB (ref 12.0–16.0)
LYMPHS ABS: 0.7 10*3/uL — AB (ref 1.0–3.6)
Lymphocyte %: 22.4 %
MCH: 28.8 pg (ref 26.0–34.0)
MCHC: 32.9 g/dL (ref 32.0–36.0)
MCV: 88 fL (ref 80–100)
MONOS PCT: 4.8 %
Monocyte #: 0.2 x10 3/mm (ref 0.2–0.9)
NEUTROS ABS: 2.3 10*3/uL (ref 1.4–6.5)
Neutrophil %: 68.6 %
Platelet: 173 10*3/uL (ref 150–440)
RBC: 2.55 10*6/uL — ABNORMAL LOW (ref 3.80–5.20)
RDW: 16 % — AB (ref 11.5–14.5)
WBC: 3.3 10*3/uL — ABNORMAL LOW (ref 3.6–11.0)

## 2014-06-17 LAB — PROTIME-INR
INR: 1.5
Prothrombin Time: 18 secs — ABNORMAL HIGH (ref 11.5–14.7)

## 2014-06-17 LAB — BASIC METABOLIC PANEL
Anion Gap: 6 — ABNORMAL LOW (ref 7–16)
BUN: 13 mg/dL (ref 7–18)
CALCIUM: 9.3 mg/dL (ref 8.5–10.1)
Chloride: 105 mmol/L (ref 98–107)
Co2: 27 mmol/L (ref 21–32)
Creatinine: 1.09 mg/dL (ref 0.60–1.30)
EGFR (Non-African Amer.): 52 — ABNORMAL LOW
GLUCOSE: 100 mg/dL — AB (ref 65–99)
OSMOLALITY: 276 (ref 275–301)
POTASSIUM: 4.1 mmol/L (ref 3.5–5.1)
SODIUM: 138 mmol/L (ref 136–145)

## 2014-06-18 LAB — PROTIME-INR
INR: 1.5
Prothrombin Time: 17.7 secs — ABNORMAL HIGH (ref 11.5–14.7)

## 2014-06-18 LAB — CBC WITH DIFFERENTIAL/PLATELET
BASOS PCT: 0.6 %
Basophil #: 0 10*3/uL (ref 0.0–0.1)
EOS PCT: 4.9 %
Eosinophil #: 0.1 10*3/uL (ref 0.0–0.7)
HCT: 21.5 % — AB (ref 35.0–47.0)
HGB: 7.1 g/dL — ABNORMAL LOW (ref 12.0–16.0)
LYMPHS ABS: 0.7 10*3/uL — AB (ref 1.0–3.6)
Lymphocyte %: 24.3 %
MCH: 28.7 pg (ref 26.0–34.0)
MCHC: 32.8 g/dL (ref 32.0–36.0)
MCV: 88 fL (ref 80–100)
MONO ABS: 0.1 x10 3/mm — AB (ref 0.2–0.9)
MONOS PCT: 5.3 %
NEUTROS ABS: 1.8 10*3/uL (ref 1.4–6.5)
Neutrophil %: 64.9 %
Platelet: 166 10*3/uL (ref 150–440)
RBC: 2.45 10*6/uL — AB (ref 3.80–5.20)
RDW: 16.2 % — ABNORMAL HIGH (ref 11.5–14.5)
WBC: 2.7 10*3/uL — AB (ref 3.6–11.0)

## 2014-06-18 LAB — BASIC METABOLIC PANEL
Anion Gap: 8 (ref 7–16)
BUN: 13 mg/dL (ref 7–18)
CALCIUM: 9.1 mg/dL (ref 8.5–10.1)
Chloride: 107 mmol/L (ref 98–107)
Co2: 25 mmol/L (ref 21–32)
Creatinine: 1.06 mg/dL (ref 0.60–1.30)
EGFR (African American): >60
GFR CALC NON AF AMER: 54 — AB
Glucose: 93 mg/dL (ref 65–99)
Osmolality: 279 (ref 275–301)
Potassium: 4.1 mmol/L (ref 3.5–5.1)
Sodium: 140 mmol/L (ref 136–145)

## 2014-06-19 LAB — PROTIME-INR
INR: 1.7
PROTHROMBIN TIME: 20 s — AB (ref 11.5–14.7)

## 2014-06-19 LAB — HEMOGLOBIN: HGB: 8.2 g/dL — AB (ref 12.0–16.0)

## 2014-06-20 ENCOUNTER — Ambulatory Visit: Payer: Self-pay | Admitting: Oncology

## 2014-06-28 LAB — CBC CANCER CENTER
BASOS ABS: 0.1 x10 3/mm (ref 0.0–0.1)
Basophil %: 1.8 %
Eosinophil #: 0.4 x10 3/mm (ref 0.0–0.7)
Eosinophil %: 9.7 %
HCT: 28.3 % — ABNORMAL LOW (ref 35.0–47.0)
HGB: 9.1 g/dL — ABNORMAL LOW (ref 12.0–16.0)
Lymphocyte #: 1 x10 3/mm (ref 1.0–3.6)
Lymphocyte %: 25.9 %
MCH: 28.7 pg (ref 26.0–34.0)
MCHC: 32.3 g/dL (ref 32.0–36.0)
MCV: 89 fL (ref 80–100)
MONO ABS: 0.1 x10 3/mm — AB (ref 0.2–0.9)
Monocyte %: 3.3 %
NEUTROS PCT: 59.3 %
Neutrophil #: 2.3 x10 3/mm (ref 1.4–6.5)
PLATELETS: 212 x10 3/mm (ref 150–440)
RBC: 3.18 10*6/uL — ABNORMAL LOW (ref 3.80–5.20)
RDW: 17.7 % — AB (ref 11.5–14.5)
WBC: 3.9 x10 3/mm (ref 3.6–11.0)

## 2014-06-28 LAB — IRON AND TIBC
IRON BIND. CAP.(TOTAL): 263 ug/dL (ref 250–450)
IRON SATURATION: 23 %
Iron: 61 ug/dL (ref 50–170)
UNBOUND IRON-BIND. CAP.: 202 ug/dL

## 2014-06-28 LAB — FERRITIN: Ferritin (ARMC): 194 ng/mL (ref 8–388)

## 2014-06-28 LAB — RETICULOCYTES
ABSOLUTE RETIC COUNT: 0.1015 10*6/uL (ref 0.019–0.186)
Reticulocyte: 3.2 % — ABNORMAL HIGH (ref 0.4–3.1)

## 2014-06-28 LAB — LACTATE DEHYDROGENASE: LDH: 283 U/L — AB (ref 81–246)

## 2014-06-28 LAB — FOLATE: Folic Acid: 17.8 ng/mL — ABNORMAL HIGH (ref 3.1–17.5)

## 2014-07-09 ENCOUNTER — Ambulatory Visit: Payer: Self-pay | Admitting: Oncology

## 2014-07-17 ENCOUNTER — Ambulatory Visit: Payer: Self-pay | Admitting: Oncology

## 2014-07-23 ENCOUNTER — Ambulatory Visit: Payer: Self-pay | Admitting: Internal Medicine

## 2014-08-07 ENCOUNTER — Ambulatory Visit
Admit: 2014-08-07 | Disposition: A | Discharge status: Home / Self Care | Payer: Self-pay | Attending: Oncology | Admitting: Oncology

## 2014-08-17 ENCOUNTER — Emergency Department: Payer: Self-pay | Admitting: Emergency Medicine

## 2014-09-01 ENCOUNTER — Emergency Department: Payer: Self-pay | Admitting: Emergency Medicine

## 2014-09-29 NOTE — H&P (Signed)
PATIENT NAME:  [name], [name] MR#:  [id number] DATE OF BIRTH:  14-Dec-1935  DATE OF ADMISSION:  07/04/2013  PRIMARY CARE PHYSICIAN:  Tama High III, MD  CHIEF COMPLAINT: "I have a cold."   HISTORY OF PRESENT ILLNESS: A 79 year old female with history of atrial fibrillation and heart disease, presents today to the ER with a cold. She has been having a rattling in the chest that is not getting better. She saw the 88 assistant in Dr. Olin Pia office last week and given a course of a steroid, cough medication and amoxicillin and is not getting better. Her cough is dry, unable to bring anything up. She has a poor appetite and still has rattling in the chest, on chest x-ray found to have a pneumonia and hospitalist services were contacted for further evaluation.   PAST MEDICAL HISTORY: Atrial fibrillation, possible Parkinson's, coronary artery disease with mitral valve replacement, diabetes, hypothyroidism, hypertension, glaucoma and gastroesophageal reflux disease.   PAST SURGICAL HISTORY: CABG with mitral valve replacement, partial hysterectomy and appendix.   ALLERGIES: No known drug allergies.   SOCIAL HISTORY: Quit smoking in 2000, alcohol in the past. 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   FAMILY HISTORY: Mother died at an early age, unknown cause. Father died of an MI.    MEDICATIONS: Include brimonidine ophthalmic solution 0.15% ophthalmic solution 1 drop twice a day, calcium carbonate 600 mg daily, Colace 100 mg twice a day, ferrous sulfate 325 mg daily, hydralazine 25 mg 2 tablets twice a day, isosorbide dinitrate 20 mg 2 tablets twice a day, latanoprost ophthalmic solution 0.005% ophthalmic solution 1 drop each eye at bedtime, levothyroxine 100 mcg 1 tablet daily, losartan 100 mg daily, omeprazole 20 mg 2 tablets twice a day, sotalol 80 mg 1/2 tablet twice a day, Tessalon Perles 100 mg 3 times a day, vitamin B12 250 mcg daily,  vitamin D3 100 international units daily, warfarin 3 mg at bedtime.   REVIEW OF SYSTEMS: CONSTITUTIONAL: No fever, chills or sweats. Positive for weakness. No weight gain. No weight loss.  EYES: She does wear glasses. Ears, nose, mouth and throat: No hearing loss. No sore throat. No difficulty swallowing.  CARDIOVASCULAR: No chest pain. Positive for palpitations.  RESPIRATORY: Positive for shortness of breath. Positive for cough. No sputum. No hemoptysis.  GASTROINTESTINAL: No nausea. No vomiting. No abdominal pain. Positive for constipation. Iron makes her stool, dark.  GENITOURINARY: No burning on urination. No hematuria.  MUSCULOSKELETAL: No joint pain or muscle pain.  INTEGUMENTARY: Positive for rash on the back of the head 3 to 4 weeks ago and something on the right leg which has faded.  NEUROLOGIC: No fainting or blackouts.  PSYCHIATRIC: No anxiety or depression.  ENDOCRINE:  Positive for hypothyroidism.  HEMATOLOGIC AND LYMPHATIC: No anemia, no easy bruising or bleeding.   PHYSICAL EXAMINATION: VITAL SIGNS: Temperature 97, pulse 78, respirations 18, blood pressure 134/70, pulse ox 95% on room air.  GENERAL: No respiratory distress.  EYES: Conjunctivae and lids normal. Pupils equal, round and reactive to light. Extraocular muscles intact. No nystagmus.  EARS, NOSE, MOUTH AND THROAT: Tympanic membranes: No erythema. Nasal mucosa: No erythema. Throat: No erythema, no exudate seen. Lips and gums: No lesions.  NECK: No JVD. No bruits. No lymphadenopathy. No thyromegaly. No thyroid nodules palpated.  RESPIRATORY: Decreased breath sounds bilaterally. Positive rhonchi in the lung fields. A slight expiratory wheeze.  CARDIOVASCULAR: S1, S2 normal. No gallops, rubs or murmurs heard.  Carotid upstroke 2+ bilaterally. No bruits. Dorsalis pedis pulses 2+ bilaterally, trace edema of the lower extremity.  ABDOMEN: Soft, nontender. No organomegaly/splenomegaly. Normoactive bowel sounds. No masses felt.   LYMPHATIC: No lymph nodes in the neck.  MUSCULOSKELETAL: Trace edema. No clubbing. No cyanosis.  SKIN: No ulcers or lesions seen.  NEUROLOGIC: Cranial nerves II through XII grossly intact. Deep tendon reflexes 1+ bilateral lower extremities.  PSYCHIATRIC: The patient is oriented to person, place and time.   LABORATORY, DIAGNOSTIC AND RADIOLOGICAL DATA:  Chest x-ray shows right middle lobe infiltrate. White blood cell count 5.0, H and H 11.1 and 34.8, platelet count of 330. Glucose 393, BUN 22, creatinine 1.64, sodium 134, potassium 3.3, chloride 95, CO2 of 34, calcium 9.8. Liver function tests are normal range. BNP elevated at 2609. Troponin negative. EKG: Normal sinus rhythm, 82 beats per minute, first-degree AV block, left axis deviation, LVH.    ASSESSMENT AND PLAN: 1.  Pneumonia, with chronic obstructive pulmonary disease exacerbation. Will give low-dose IV Solu-Medrol 40 mg IV q.8 hours. ER physician wrote for Rocephin and Zithromax.  Zithromax  does interact with the sotalol so I will switch that over to Rocephin and doxycycline. As per the husband, she was called last week with a chest x-ray that did not show pneumonia. Amoxicillin probably is not the best antibiotic for pneumonia so will treat with proper antibiotic to cover the lung.  2.  Coronary artery disease with history of atrial fibrillation, on Coumadin. Unfortunately no INR was sent in the Emergency Room. I will add on an INR and dose Coumadin. No chest pains. I will not order any further troponins.  3.  Diabetes with sugar of 393. Will put on sliding scale and check a hemoglobin A1c. May end up needing medications. Sugars could be high secondary to steroids also.  4.  Hypothyroidism. Continue levothyroxine.  5.  Hypertension. Will hold the losartan with the bump up in creatinine.  6.  Hypokalemia. Will replace potassium.  7.  Glaucoma. Continue eye drops.  8.  Gastroesophageal reflux disease. We will give Protonix here instead of  the omeprazole.  9.  Chronic kidney disease, stage 3. Creatinine up just a little bit. Will hold the losartan for right now and give a liter of IV fluids. This patient is not in congestive heart failure at this time.   TIME SPENT ON ADMISSION: 55 minutes.   CODE STATUS:  The patient is a full code.     ____________________________ Tana Conch. Leslye Peer, MD rjw:cs D: 07/04/2013 14:22:29 ET T: 07/04/2013 14:41:03 ET JOB#: [id number]  cc: Tana Conch. Leslye Peer, MD, <Dictator> Tama High III, MD Marisue Brooklyn MD ELECTRONICALLY SIGNED 07/07/2013 15:26

## 2014-09-29 NOTE — Discharge Summary (Signed)
PATIENT NAMESUMIYA, [name] MR#:  [id number] DATE OF BIRTH:  1936-02-14  DATE OF ADMISSION:  07/04/2013 DATE OF DISCHARGE:  91/30/2015  FINAL DIAGNOSES:  1.  Pneumonia.  2.  Esophageal dysmotility.  3.  Atrial fibrillation.  4.  Parkinson's.  5.  Coronary artery disease with mitral valve replacement.  6.  Adult onset diabetes mellitus. 7.  Hypothyroidism.  8.  Hypertension.  9.  Glaucoma.  10. Chronic left-sided diastolic congestive heart failure.   HISTORY AND PHYSICAL: Please see dictated admission history and physical.   HOSPITAL COURSE: The patient was admitted with a persistent cough with some wheezing as well. Chest films reveal evidence of pneumonia. She was placed on antibiotics and steroids. Wheezing resolved very rapidly and steroids were tapered down. She was changed over to Ceftin as her antibiotic choices are limited by her use of sotalol and her Coumadin. Fortunately, she tolerated this well, though her Coumadin had to be reduced. Follow up film showed improvement. The patient felt better overall. She ambulated with without difficulty, and she remained on room air. At this point, she will be discharged home in stable condition with her physical activity to be up as tolerated. She will follow a 2 gram sodium diet. She should check her sugar daily and record this. She is recommended to weigh herself daily and call physician for more than 2 pound weight gain in 1 day or 5 pounds in 1 week or any other increasing signs or symptoms of congestive heart failure. She is familiar with these symptoms. We will anticipate her following up in our office within the next 1 week.   DISCHARGE MEDICATIONS:  1.  Tessalon 100 mg p.o. t.i.d. p.r.n. cough.  2.  Isordil 20 mg, 2 tablets p.o. b.i.d.  3.  Omeprazole 20 mg, 2 tablets p.o. b.i.d.  4.  Ferrous sulfate 325 mg p.o. daily.  5.  Vitamin B12 250 mcg p.o. daily.  6.  Vitamin D 1000 units p.o. daily.  7.  Calcium carbonate 600 mg p.o.  daily.  8.  Levothyroxine 0.1 mg p.o. daily.  9.  Sotalol 80 mg 1/2 tablet p.o. b.i.d.  10. Hydralazine 25 mg, 2 tablets p.o. b.i.d.  11. Latanoprost 1 drop to each eye once a day.  12. Brimonidine 1 drop to each eye b.i.d.  13. Losartan 100 mg, half tablet p.o. daily.  14. Coumadin 2.5 mg p.o. daily.  15. Prednisone 10 mg starting 3 tablets p.o. daily, decreasing by 1 tablet daily until done.  16. Ceftin 500 mg p.o. b.i.d. x 8 days.   The patient is to hold Colace as she has been having some diarrhea. She was informed that her Coumadin dose was changed.  ____________________________ Adin Hector, MD bjk:aw D: 07/07/2013 77:41:28 ET T: 07/07/2013 07:06:25 ET JOB#: [id number]  cc: Adin Hector, MD, <Dictator> Ramonita Lab MD ELECTRONICALLY SIGNED 07/08/2013 11:47

## 2014-09-29 NOTE — Discharge Summary (Signed)
PATIENT NAMEALYSSON, [name] MR#:  [id number] DATE OF BIRTH:  04-Feb-1936  DATE OF ADMISSION:  08/15/2013 DATE OF DISCHARGE:  08/19/2013  FINAL DIAGNOSES:  1.  Nonketotic hyperosmolar state.  2.  Acute renal failure secondary to prerenal state.  3.  Dehydration secondary to #1.  4.  Acute T wave inversion on initial EKG thought to be secondary to severe hyperglycemia.  5.  Hypertension.  6.  Chronic left-sided systolic congestive heart failure.  7.  Mechanical heart valve.  8.  Atrial fibrillation.  9.  Chronic blood loss anemia.  10. Enterobacter urinary tract infection.   HISTORY AND PHYSICAL: Please see dictated admission history and physical.   HOSPITAL COURSE: The patient was admitted with severe hyperglycemia. Etiology was not entirely clear. Medications were adjusted. An endocrinology consultation obtained, with improved control. She was found to have a urinary tract infection and was placed on antibiotics for this with antibiotics adjusted based on sensitivities.   The patient showed slow but steady improvement. She was maintained on Coumadin for history of mechanical heart valve. She had blood pressure medications adjusted and diuretics held secondary to dehydration and responding to her fluid resuscitation. Physical therapy worked with the patient and she did make progress, though it was felt that she would benefit from continued physical therapy to work on balancing and strength.   The patient will be discharged to home with home health in stable condition with her physical activity to be up with a walker as tolerated. She is to check her blood sugar and record this 2 times a day. She will followup in our office in 1 to 2 weeks. She will follow a 2 grams sodium carbohydrate controlled diet. Home health PT and R.N. were ordered.   DISCHARGE MEDICATIONS:  1.  Omeprazole 40 mg p.o. b.i.d.  2.  Iron sulfate 325 mg p.o. daily.  3.  Vitamin B12 250 mcg p.o. daily.  4.  Vitamin D  1000 units p.o. daily.  5.  Calcium 600 mg p.o. daily.  6.  Levothyroxine 0.1 mg p.o. daily.  7.  Sotalol 80 mg 1/2 tablet p.o. b.i.d.  8.  Latanoprost 1 drop to each eye once a day.  9.  Brimonidine 0.15% 1 drop to each eye b.i.d.  10. Losartan 100 mg 1/2 tablet p.o. daily.  11. Coumadin 3 mg p.o. daily.  12. Glipizide XL 5 mg p.o. daily.  13. Bactrim DS 1 p.o. b.i.d. x 4 days to complete course of antibiotics for urinary tract infection.   The patient was given instructions to hold Isordil, hydralazine and furosemide at this time.  ____________________________ Adin Hector, MD bjk:aw D: 08/31/2013 07:57:56 ET T: 08/31/2013 08:38:52 ET JOB#: [id number]  cc: Tama High III, MD, <Dictator> Ramonita Lab MD ELECTRONICALLY SIGNED 09/04/2013 8:03

## 2014-09-29 NOTE — Op Note (Signed)
PATIENT NAME:  [name], COYT MR#:  [id number] DATE OF BIRTH:  1935-06-24  DATE OF PROCEDURE:  10/18/2013  PRIMARY CARE PHYSICIAN:  [name].   PREPROCEDURE DIAGNOSIS:  Sick sinus syndrome.   POSTOPERATIVE DIAGNOSIS:  Intermittent ventricular pacing.   INDICATION:  The patient is a 79 year old female with history of atrial fibrillation with rapid ventricular rate alternating with episodes of sinus bradycardia. The patient has had difficulty with control of her rapid atrial fibrillation, currently on low-dose sotalol primarily due to sinus bradycardia. The procedure risks, benefits and alternatives of permanent pacemaker implantation were explained to the patient and informed written consent was obtained.   She was brought to the operating room in a fasting state. The left pectoral region was prepped and draped in the usual sterile manner. Anesthesia was obtained with 1% Xylocaine locally. A 6 cm incision was performed over the left pectoral region. Access was obtained of the left subclavian vein by fine needle aspiration. Ventricular Medtronic 5076 and atrial (Medtronic [id number]) leads were positioned to the right ventricular apex and right atrial appendage under fluoroscopic guidance. After proper thresholds were obtained, the leads were sutured in place. The pacemaker pocket was irrigated with gentamicin solution. The leads were connected to a MRI compatible, dual-chamber, rate-responsive pacemaker generator (Medtronic [id number]) and positioned in the pocket. The pocket was closed with 2-0 and 4-0 Vicryl, respectively. Steri-Strips and a pressure dressing were applied.    ____________________________ Isaias Cowman, MD ap:dmm D: 10/18/2013 14:16:25 ET T: 10/18/2013 15:35:27 ET JOB#: [id number]  cc: Isaias Cowman, MD, <Dictator> Isaias Cowman MD ELECTRONICALLY SIGNED 11/06/2013 15:05

## 2014-09-29 NOTE — Consult Note (Signed)
PATIENT NAME:  [name], [name] MR#:  [id number] DATE OF BIRTH:  Nov 27, 1935  DATE OF CONSULTATION:    REFERRING PHYSICIAN:  [name] CONSULTING PHYSICIAN:  Corey Skains, MD  REASON FOR CONSULTATION:  Atrial fibrillation and rapid ventricular rate, angina, coronary artery disease, diabetes, mitral valve replacement.   CHIEF COMPLAINT:  Chest pain.   HISTORY OF PRESENT ILLNESS:  This is a 79 year old female with known atrial fibrillation with coronary artery disease status post coronary artery bypass graft, diabetes and mitral valve replacement in the past, on appropriate medication management.  The patient has had reasonable control in the recent past, but has new-onset shortness of breath with any physical activity over the last several weeks and seen in the Emergency Room with significant shortness of breath with chest pressure, substernal in nature, with atrial fibrillation with rapid ventricular rate.  She has had better control of her heart rate with appropriate beta blocker and the patient has now had resolution of chest pain with an EKG currently showing atrial fibrillation with nonspecific ST changes.  The patient also has had a normal troponin, but a BNP of 2854 and an INR of 2.4.  The patient has been resting comfortably and there has been no evidence of further significant issue.  The patient's communication skills are difficult and therefore review of systems is difficult.   PAST MEDICAL HISTORY: 1.  Coronary artery disease with coronary artery bypass graft.  2.  Diabetes.  3.  Hypertension.  4.  Hyperlipidemia.  5.  Mitral valve replacement.  6.  Atrial fibrillation.   FAMILY HISTORY:  No family members with early onset of cardiovascular disease or hypertension.   SOCIAL HISTORY:  Currently denies alcohol or tobacco use.   ALLERGIES:  AS LISTED.   MEDICATIONS:  As listed.   PHYSICAL EXAMINATION: VITAL SIGNS:  Blood pressure is 122/68 bilaterally, heart rate is 78  upright, reclining and irregular.  GENERAL:  She is a well-appearing elderly female in no acute distress.  HEAD, EYES, EARS, NOSE AND THROAT:  No icterus, thyromegaly, ulcers, hemorrhage, or xanthelasma.  CARDIOVASCULAR:  Irregularly irregular with normal crisp S1 and S2 with a two out of six apical murmur consistent with mitral regurgitation.  PMI is diffuse.  Carotid upstroke normal without bruits.  Jugular venous pressure is normal.  LUNGS:  Have bibasilar crackles with normal respirations.  ABDOMEN:  Soft, nontender, without hepatosplenomegaly or masses.  The abdominal aorta normal size without bruit.  EXTREMITIES:  Show 2+ radial, femoral, dorsal pedal pulses, with trace to 1+ lower extremity edema.  No cyanosis, clubbing or ulcers.  NEUROLOGIC:  She is oriented to time, place, and person, with normal mood and affect.   ASSESSMENT:  A 79 year old female with atrial fibrillation with rapid ventricular rate and chest discomfort, possibly secondary to mild congestive heart failure, coronary artery disease without current evidence of myocardial infarction.   RECOMMENDATIONS: 1.  Continue beta blocker for heart rate control of atrial fibrillation.  2.  Anticoagulation for further risk reduction and stroke with atrial fibrillation and valvular heart disease.  3.  Echocardiogram for further evaluation of extent of valvular heart disease.  4.  Lexiscan infusion Myoview for assessment of myocardial ischemia due to no current evidence of myocardial infarction and further treatment options including the possibility of cardiac catheterization if significant ischemia is occurring.  5.  Ambulation and further investigation of other causes of chest pain and shortness of breath.  6.  Diabetes medication management for further tx  risk factors.  7.  Statin therapy if able.     ____________________________ Corey Skains, MD bjk:ea D: 09/05/2013 22:59:00 ET T: 09/05/2013 23:40:19  ET JOB#: [id number]  cc: Corey Skains, MD, <Dictator> Corey Skains MD ELECTRONICALLY SIGNED 09/11/2013 13:01

## 2014-09-29 NOTE — Consult Note (Signed)
PATIENT NAME:  [name], [name] MR#:  [id number] DATE OF BIRTH:  June 06, 1936  DATE OF CONSULTATION:  09/14/2013  ATTENDING PHYSICIAN:  [name] CONSULTING PHYSICIAN:  Roena Malady, MD  Report  regarding bleeding from right ear.   HISTORY OF PRESENT ILLNESS: This is a 79 year old female who was admitted on the 3rd for altered mental status and cholecystitis. She was noticed, according to her husband, who was in the room with her today, about three days ago they noticed some intermittent bleeding from her right ear. She had been complaining that her ear had been stopped up and he said that she had been picking at it. She was started on last discharge from the hospital on Lovenox and Coumadin. In reviewing our charts at the office she has had a history of wax impactions in the past.   PAST MEDICAL HISTORY: Significant for rheumatic heart disease, status post mitral valve replacement with metallic valve, coronary artery disease, status post bypass surgery, atrial fibrillation, diabetes, hypertension, hypothyroidism, reflux and glaucoma.   ALLERGIES: She has no drug allergies noted.  SOCIAL HISTORY:  No history of smoking or alcohol.   FAMILY HISTORY: Noncontributory.   MEDICATIONS: Noted and listed in the chart.   PHYSICAL EXAMINATION:  HEENT: She has some obvious dried blood in the right ear canal. Examination does show some cerumen medially and some excoriation in the ear canal it appears where she has been scratching it. Left ear relatively clear. The anterior nose benign. Oral cavity and oropharynx anteriorly was clear.  NECK: Palpation of the neck unremarkable.   IMPRESSION AND RECOMMENDATIONS: Excoriation ear canal with cerumen impaction. We started her on some Floxin drops, 4 drops twice a day. Her husband thinks that she may be going home today or tomorrow. I have scheduled her for an outpatient follow-up with me in a week. We will clear up this mild excoriation and get the wax out  when she returns. They are in agreement with this plan.   ____________________________ Roena Malady, MD ctm:sg D: 09/14/2013 13:21:00 ET T: 09/14/2013 13:32:58 ET JOB#: [id number]  cc: Roena Malady, MD, <Dictator> Roena Malady MD ELECTRONICALLY SIGNED 10/04/2013 8:25

## 2014-09-29 NOTE — Consult Note (Signed)
Chief Complaint:  Subjective/Chief Complaint Much more alert today . No cp not sob No bleeding. Toleratig therapy well.   VITAL SIGNS/ANCILLARY NOTES: **Vital Signs.:   18-May-15 11:05  Vital Signs Type Routine  Temperature Temperature (F) 97.5  Celsius 36.3  Temperature Source oral  Pulse Pulse 85  Respirations Respirations 18  Systolic BP Systolic BP 94  Diastolic BP (mmHg) Diastolic BP (mmHg) 60  Mean BP 71  Pulse Ox % Pulse Ox % 99  Pulse Ox Activity Level  At rest  Oxygen Delivery Room Air/ 21 %  *Intake and Output.:   18-May-15 11:24  Grand Totals Intake:  240 Output:      Net:  240 24 Hr.:  330  Oral Intake      In:  240  Percentage of Meal Eaten  100   Brief Assessment:  GEN well developed, well nourished, no acute distress   Cardiac Regular  murmur present  --Gallop  clicks from MVR   Respiratory normal resp effort  clear BS  rhonchi   Gastrointestinal Normal   Gastrointestinal details normal Soft   EXTR negative cyanosis/clubbing, negative edema   Lab Results: Routine Coag:  18-May-15 05:47   Activated PTT (APTT)  119.1 (A HCT value >55% may artifactually increase the APTT. In one study, the increase was an average of 19%. Reference: "Effect on Routine and Special Coagulation Testing Values of Citrate Anticoagulant Adjustment in Patients with High HCT Values." American Journal of Clinical Pathology 2006;126:400-405.)  Prothrombin  17.8  INR 1.5 (INR reference interval applies to patients on anticoagulant therapy. A single INR therapeutic range for coumarins is not optimal for all indications; however, the suggested range for most indications is 2.0 - 3.0. Exceptions to the INR Reference Range may include: Prosthetic heart valves, acute myocardial infarction, prevention of myocardial infarction, and combinations of aspirin and anticoagulant. The need for a higher or lower target INR must be assessed individually. Reference: The Pharmacology and  Management of the Vitamin K  antagonists: the seventh ACCP Conference on Antithrombotic and Thrombolytic Therapy. JJKKX.3818 Sept:126 (3suppl): [id number]. A HCT value >55% may artifactually increase the PT.  In one study,  the increase was an average of 25%. Reference:  "Effect on Routine and Special Coagulation Testing Values of Citrate Anticoagulant Adjustment in Patients with High HCT Values." American Journal of Clinical Pathology 2006;126:400-405.)    13:20   Activated PTT (APTT)  91.9 (A HCT value >55% may artifactually increase the APTT. In one study, the increase was an average of 19%. Reference: "Effect on Routine and Special Coagulation Testing Values of Citrate Anticoagulant Adjustment in Patients with High HCT Values." American Journal of Clinical Pathology 2006;126:400-405.)  Routine Hem:  18-May-15 05:47   Platelet Count (CBC) 235 (Result(s) reported on 23 Oct 2013 at 06:30AM.)  Hemoglobin (CBC)  10.8 (Result(s) reported on 23 Oct 2013 at 06:30AM.)   Radiology Results: XRay:    13-May-15 14:59, Chest Portable Single View  Chest Portable Single View   REASON FOR EXAM:    Pacemaker  COMMENTS:       PROCEDURE: DXR - DXR PORTABLE CHEST SINGLE VIEW  - Oct 18 2013  2:59PM     CLINICAL DATA:  Post op Pacemaker    EXAM:  PORTABLE CHEST - 1 VIEW    COMPARISON:  DG CHEST 1V PORT dated 10/10/2013    FINDINGS:  Low lung volumes. Cardiac silhouette is enlarged. Patient is status  post median sternotomy coronary artery bypass grafting. Persistent  diffuse  interstitial prominence with slight decrease conspicuity.  Linear areas of increased density left lung base. No acute osseous  abnormalities. Stable left chest wall cardiac pacing unit.     IMPRESSION:  Decreased pulmonary edema    Atelectasis versus infiltrate left lung base      Electronically Signed    By: Margaree Mackintosh M.D.    On: 10/18/2013 15:13       Verified By: Mikki Santee, M.D., MD  Cardiology:     12-May-15 18:08, ECG  Ventricular Rate 81  Atrial Rate 81  QRS Duration 86  QT 370  QTc 429  R Axis -30  T Axis 108  ECG interpretation   Atrial fibrillation  Left axis deviation  Minimal voltage criteria for LVH, may be normal variant  Nonspecific T wave abnormality  Abnormal ECG  When compared with ECG of 10-Oct-2013 05:51,  Inverted T waves have replaced nonspecific T wave abnormality in Lateral leads  ----------unconfirmed----------  Confirmed by OVERREAD, NOT ([id number]), editor PEARSON, BARBARA ([id number]) on 10/18/2013 12:51:47 PM  ECG     14-May-15 09:43, Echo Doppler  Echo Doppler   REASON FOR EXAM:      COMMENTS:       PROCEDURE: [facility] - ECHO DOPPLER COMPLETE(TRANSTHOR)  - Oct 19 2013  9:43AM     RESULT: Echocardiogram Report    Patient Name:   [name] [facility] Date of Exam: 10/19/2013  Medical Rec #:  [id number]  Custom1:  Date of Birth:  1935/11/11                Height:       63.0 in  Patient Age:    79 years                Weight:       123.5 lb  Patient Gender: F                       BSA:          1.58 m??    Indications: Pericardial Effusion  Sonographer:    Sherrie Sport RDCS  Referring Phys: Lujean Amel, D    Summary:   1. Left ventricular ejection fraction, by visual estimation, is 30 to   35%.   2. Moderately to severely decreased global left ventricular systolic   function.   3. Mildly increased left ventricular septal thickness.   4. Mildly dilated left atrium.   5. Mildly dilated right atrium.   6. Mild to moderate mitral valve regurgitation.   7. Mild to moderate aortic valve sclerosis/calcification without any   evidence of aortic stenosis.   8. Mild to moderate tricuspid regurgitation.   9. Moderately increased left ventricular posterior wall thickness.  10. Inferior posterior severe hypo/Moderate global hypo.  87. Well seated metallic prosthetic valve.  12. Thickened tricuspid valve.  2D AND M-MODE MEASUREMENTS (normal ranges within  parentheses):  Left Ventricle:          Normal  IVSd (2D):      1.49 cm (0.7-1.1)  LVPWd (2D):     1.68 cm (0.7-1.1) Aorta/LA:                  Normal  LVIDd (2D):     3.93 cm (3.4-5.7) Aortic Root (2D): 3.00 cm (2.4-3.7)  LVIDs (2D):     3.55 cm           Left Atrium (2D): 5.00 cm (1.9-4.0)  LV FS (  2D):      9.7 %   (>25%)  LV EF (2D):     21.6 %   (>50%)                                    Right Ventricle:                                    RVd (2D):        6.21 cm  LV DIASTOLIC FUNCTION:  MV Peak E: 1.34 m/s E/e' Ratio: 24.50                      Decel Time: 151 msec  SPECTRAL DOPPLER ANALYSIS (where applicable):  Mitral Valve:  MV Max Vel:   1.75 m/s MV P1/2 Time: 43.79 msec  MV Mean Grad: 3.0 mmHg MV Area, PHT: 5.02 cm??  Aortic Valve: AoV Max Vel: 1.07 m/s AoV Peak PG: 4.6 mmHg AoV Mean PG:   3.0 mmHg  LVOT Vmax: 0.40 m/s LVOT VTI: 0.082 m LVOT Diameter: 2.00 cm  AoV Area, Vmax: 1.18 cm?? AoV Area, VTI: 1.25 cm?? AoV Area, Vmn: 1.24 cm??  Tricuspid Valve and PA/RV Systolic Pressure: TR Max Velocity: 2.70 m/s RA   Pressure: 5 mmHg RVSP/PASP: 34.2 mmHg  Pulmonic Valve:  PV Max Velocity: 1.08 m/s PV Max PG: 4.7 mmHg PV Mean PG:    PHYSICIAN INTERPRETATION:  Left Ventricle: The left ventricular internal cavity size was normal. LV   septal wall thickness was mildly increased. LV posterior wall thickness   was moderately increased. Global LV systolic function was moderately to   severely decreased. Left ventricular ejection fraction, by visual   estimation, is 30 to 35%.  Right Ventricle: The right ventricular size is mildly enlarged. Global RV   systolic function is low normal.  Left Atrium: The left atrium is mildly dilated.  Right Atrium: The right atrium is mildly dilated.  Pericardium: There is no evidence of pericardial effusion.  Mitral Valve: Mild to moderate mitral valve regurgitation is seen. Well   seated metallic prosthetic valve.  Tricuspid Valve: The tricuspid  valve is thickened. Mild to moderate   tricuspid regurgitation is visualized. The tricuspid regurgitant velocity   is 2.70 m/s, and with an assumed right atrial pressure of 5 mmHg, the     estimated right ventricular systolic pressure is normal at 34.2 mmHg.  Aortic Valve: The aortic valve is normal. Mild to moderate aortic valve   sclerosis/calcification is present, without any evidence of aortic   stenosis. No evidence of aortic valve regurgitation is seen.  Pulmonic Valve: The pulmonic valve is normal.    Los Barreras MD  Electronicallysigned by Winfield MD  Signature Date/Time: 10/19/2013/9:04:28 PM    *** Final ***    IMPRESSION: .      Verified By: Yolonda Kida, M.D., MD   Assessment/Plan:  Invasive Device Daily Assessment of Necessity:  Does the patient currently have any of the following indwelling devices? none   Assessment/Plan:  Assessment IMP Chronic anticougulation S/P Pacer placement HTN CAD AFIB MVR metal CHF DM SOB Weakness .   Plan PLAN Increase dose of coumadin to help with INR 10mg  today POD 5 PPM daul chamber Resume heparin IV for anticoug Continue coumadin today for long term anticoug Agree with  DM control Rate control /Rhythm control with Sotolol HTN control with usual meds Increase activity PT/OT Antibx post op Awaiting PT/INRtobe theraputic prior to d/c   Electronic Signatures: Lujean Amel D (MD)  (Signed 18-May-15 18:06)  Authored: Chief Complaint, VITAL SIGNS/ANCILLARY NOTES, Brief Assessment, Lab Results, Radiology Results, Assessment/Plan   Last Updated: 18-May-15 18:06 by Yolonda Kida (MD)

## 2014-09-29 NOTE — Consult Note (Signed)
PATIENT NAME:  [name], [name] MR#:  [id number] DATE OF BIRTH:  [birth year]  DATE OF CONSULTATION:  09/10/2013  REFERRING PHYSICIAN:   CONSULTING PHYSICIAN:  Isaias Cowman, MD  PRIMARY CARE PHYSICIAN: Adin Hector, MD  CHIEF COMPLAINT:  Abdominal pain.   HISTORY OF PRESENT ILLNESS: The patient is a 79 year old female with known history of coronary artery disease, status post bypass graft surgery, valvular heart disease, status post mitral valve replacement with atrial fibrillation. The patient is admitted at this time with abdominal pain, nausea and vomiting and decreased mental status. The patient has been diagnosed with acute cholecystitis. During the hospitalization, the patient has been in atrial fibrillation with intermittent rapid rate, currently on warfarin 3 mg daily and sotalol 40 mg b.i.d. The patient denies chest pain or palpitations. The patient is scheduled for HIDA can tomorrow. If HIDA scan positive for cystic duct obstruction, the patient will require cholecystectomy.   PAST MEDICAL HISTORY: 1.  Coronary artery disease, status post bypass graft surgery.  2.  Valvular heart disease, status post mechanical mitral valve.  3.  Paroxysmal atrial fibrillation.  4.  History of rheumatic heart disease.  5.  Diabetes.  6.  Hypertension.  7.  Hypothyroidism.   MEDICATIONS ON ADMISSION: Warfarin 3 mg daily, losartan 50 mg daily, potassium 10 mEq daily, sotalol 40 mg b.i.d., brimonidine ophthalmic solution 0.15% b.i.d., calcium carbonate 600 mg daily, glipizide 5 mg daily, latanoprost eyedrops 0.005% at bedtime, Synthroid 100 mcg daily, omeprazole 20 mg daily, potassium 10 mEq daily, vitamin B12 250 mcg daily.   SOCIAL HISTORY: The patient is married and resides with her husband. She denies tobacco abuse.   FAMILY HISTORY: Positive for coronary artery disease.   REVIEW OF SYSTEMS:    CONSTITUTIONAL: The patient has had some mild fever.  EYES: No blurry vision.  EARS: No  hearing loss.  RESPIRATORY: No shortness of breath.  CARDIOVASCULAR: The patient denies chest pain or palpitations.  GASTROINTESTINAL: The patient has had abdominal discomfort, nausea and vomiting.  GENITOURINARY: No dysuria or hematuria.  ENDOCRINE: No polyuria or polydipsia.  MUSCULOSKELETAL: No arthralgias or myalgias.  NEUROLOGICAL: No focal muscle weakness or numbness.  PSYCHOLOGICAL: No depression or anxiety.   PHYSICAL EXAMINATION: VITAL SIGNS: Blood pressure 116/68, pulse 55, respirations 18, temperature 98.3, pulse ox 94%.  HEENT: Pupils equal and reactive to light and accommodation.  NECK: Supple without thyromegaly.  LUNGS: Clear.  HEART: Crisp mitral valve prosthetic sound, no murmur.  ABDOMEN: Soft and nontender.  EXTREMITIES:  Pulses were intact bilaterally.  MUSCULOSKELETAL: Normal muscle tone.  NEUROLOGIC: The patient is alert and oriented x 3. Motor and sensory were both grossly intact.   IMPRESSION: A 79 year old female with known coronary artery disease, status post bypass graft surgery as well as valvular heart disease, status post mitral valve replacement, who presents with acute cholecystitis, may require cholecystectomy. Patient currently is in atrial fibrillation with apparent episodes of aberrancy. She has been noted to become bradycardiac, particularly at night, which appears currently to be asymptomatic.   RECOMMENDATIONS: 1.  Agree with overall current therapy.  2.  Continue sotalol for rate and rhythm control.  3.  Continue bridging with heparin drip while off of warfarin.  4.  Replete potassium and magnesium.  5.  No indication at present for transvenous pacemaker.   ____________________________ Isaias Cowman, MD ap:cs D: 09/10/2013 11:07:07 ET T: 09/10/2013 14:14:31 ET JOB#: [id number]  cc: Isaias Cowman, MD, <Dictator> Isaias Cowman MD ELECTRONICALLY SIGNED 10/10/2013 12:44

## 2014-09-29 NOTE — Consult Note (Signed)
PATIENT NAME:  [name] MR#:  [id number] DATE OF BIRTH:  1935/08/10  DATE OF CONSULTATION:  08/15/2013  REFERRING PHYSICIAN:  Adin Hector, MD CONSULTING PHYSICIAN:  A. Lavone Orn, MD  CHIEF COMPLAINT: Severe hyperglycemia.   HISTORY OF PRESENT ILLNESS: This is a 79 year old female admitted earlier today after she presented with generalized weakness. She has a history of diabetes mellitus and had been on Amaryl 2 mg daily up until December, at which time it was stopped due to concerns of low blood sugars and a low-normal hemoglobin A1c of 5.6%. The patient was interviewed and her husband was at the bedside. She reports she had been doing well up until less than 2 weeks prior to admission, when she developed lethargy, increased thirst and poor appetite. She denies any nausea or vomiting. No abdominal pain. No fevers. No cough. No shortness of breath. No dysuria or hematuria. On presentation, initial blood sugar was 697. She was not acidotic, bicarbonate was 30 and she had a normal white count, and she did have acute on chronic renal failure with a creatinine of 2.3 (baseline 1.3 to 1.6). The patient denies any recent fevers or signs or symptoms of illness or infection. She had been drinking juice, tea, as well as water due to her increased thirst. She has been given subcutaneous insulin, a total of Levemir 15 units and aspart 22 units, with subsequent improvement in her blood sugars. Blood sugar today at 11:00 a.m. was 176 and at 4:00 p.m. was 88.   PAST MEDICAL HISTORY: 1.  Diabetes mellitus.  2.  Osteopenia.  3.  Stage III chronic kidney disease.  4.  Anemia.  5.  B12 deficiency.  6.  Coronary artery disease.  7.  Atrial fibrillation.  8.  Hypercalcemia attributed to hyperparathyroidism.  9.  Hypothyroidism.  10.  Hyperlipidemia.  11.  Hypertension.  12.  Sick sinus syndrome.  13.  History of diastolic congestive heart failure.  14.  Diabetic gastroparesis.  15.  GERD.    PAST SURGICAL HISTORY: 1.  Three-vessel CABG, 2010.  2.  Hysterectomy.  3.  Mitral valve replacement with mechanical mitral valve.   SOCIAL HISTORY: The patient is married. She does not smoke cigarettes or drink alcohol.   FAMILY HISTORY: Positive for diabetes, peripheral vascular disease, coronary artery disease.   REVIEW OF SYSTEMS:    GENERAL: Denies weight loss. Denies fever.  HEENT: Denies blurred vision. Denies sore throat.  NECK: Denies neck pain or dysphagia.  CARDIAC: Denies chest pain or palpitations.  PULMONARY: Denies cough or shortness of breath.  ABDOMEN: Denies abdominal pain. Continues to have poor appetite. Denies nausea.  EXTREMITIES: Denies leg swelling.  SKIN: Denies rash or pruritus.  ENDOCRINE: Denies heat or cold intolerance.  GENITOURINARY: Denies dysuria or hematuria.  NEUROLOGIC: Denies falls.   LABORATORY DATA: On admission, glucose 697, BUN 38, creatinine 2.38, sodium 122, chloride 88, eGFR 22, AST 32, ALT 17. Troponin I of 0.08, 0.07, and 0.06. Hematocrit 34.1. Urinalysis notable for greater than 500 glucose, negative ketones, negative nitrite, negative protein, negative blood, negative leukocyte esterase. Blood cultures negative to date.   ASSESSMENT: A 79 year old female with history of type 2 diabetes admitted for hyperglycemic, hyperosmolar, nonketotic state. No clear cause of this severe hyperglycemia has been identified. No signs or symptoms of infection.   RECOMMENDATIONS:  1.  She has had good improvement in blood sugars. Plan to continue her NovoLog insulin sliding scale. Do not plan to re-dose her Levemir.  2.  Options for outpatient management include consideration of restarting a sulfonylurea and/or use of a DPP-4 inhibitor (such as Tradjenta), which would carry less risk of hypoglycemia.  3.  Recommend low-carbohydrate diet.  4.  Recommended to patient that she get in the habit of checking blood sugars fairly regularly. She should check at  least 3 times per week if she felt blood sugars were in good control, or more often if sugars were running high. 5.  Will reassess tomorrow and consider initiating oral diabetes medications based on how she is doing.   Thank you for the kind request for consultation.   ____________________________ A. Lavone Orn, MD ams:jcm D: 08/15/2013 16:55:31 ET T: 08/15/2013 17:43:33 ET JOB#: [id number]  cc: A. Lavone Orn, MD, <Dictator> Adin Hector, MD Gracy Bruins Elliet Goodnow MD ELECTRONICALLY SIGNED 08/26/2013 12:52

## 2014-09-29 NOTE — H&P (Signed)
PATIENT NAME:  Destiny, Floyd MR#:  [id number] DATE OF BIRTH:  12/31/1935  DATE OF ADMISSION:  09/05/2013  REFERRING PHYSICIAN: Marjean Donna, MD  PRIMARY CARE PHYSICIAN: Ramonita Lab, MD  CHIEF COMPLAINT: Chest pain.   HISTORY OF PRESENT ILLNESS: This is a 79 year old female with known history of diabetes, hypertension, hyperlipidemia, coronary artery disease, and mechanical mitral valve replacement, on chronic anticoagulation. The patient was recently discharged on 03/14 from the hospital after diagnosis of hyperosmolar nonketotic state. The patient was discharged off her Lasix, off her antihypertensive medication. Her blood pressure was controlled. The patient presents with complaints of chest pain, reports it started last evening while she was resting at home, denies any hemoptysis, any leg swelling, any cough, any productive sputum. Reports some mild nausea and shortness of breath was accompanied with it. Chest pain resolved prior to patient coming to ED without any intervention. Currently, she is chest pain-free. Her first troponin is negative. Her EKG does not show any significant changes from previous. The patient is known to have history of atrial fibrillation, has EKG showing atrial fibrillation which is controlled at this point. As well, patient reports she has been having worsening lower extremity edema. She has been instructed by her physician to weigh herself on a daily basis. If there is increase in weight or leg swelling or shortness of breath to resume her Lasix. Husband reports she took yesterday 1 dose of Lasix as she has been having worsening lower extremity edema. The patient had elevated BNP at 2800 but it appears to be around her baseline which is 2600. As well her chest x-ray does not show any acute findings. The patient had EKG done during her last admission showing ejection fraction 60% but showing some diastolic dysfunction. Reported chest left side with nonradiating  pressure-like quality.   PAST MEDICAL HISTORY:  1. Atrial fibrillation.  2. Parkinson's disease. 3. Coronary artery disease, status post CABG.  4. Mitral valve replacement with mechanical valve.   5. Diabetes mellitus, currently on glipizide.  6. Hypothyroidism.  7. Hypertension, off medication.  8. Glaucoma.  9. Gastroesophageal reflux disease.  10. Debility.   PAST SURGICAL HISTORY:  1. Coronary artery bypass grafting.  2. Mitral valve replacement.  3. Partial hysterectomy.  4. Appendectomy.   ALLERGIES: No known drug allergies.   HOME MEDICATIONS:  1. Omeprazole 40 mg oral 2 times a day.  2. Iron 325 mg oral daily.  3. Vitamin B12 at 250 mcg oral daily.  4. Vitamin D 1000 international units daily.  5. Calcium 600 mg oral daily.  6. Levothyroxine 0.1 mg oral daily.  7. Sotalol 40 mg oral 2 times a day.  8. Latanoprost 1 drop each eye daily.  9. Brimonidine 0.15% at 1 drop each eye b.i.d.  10. Losartan 100 mg oral half tablet daily.  11. Warfarin 3 mg oral daily.  12. Glipizide XL 5 mg oral daily.   SOCIAL HISTORY: Quit smoking in 2000. No alcohol. No illicit drug use. Lives with her husband.   FAMILY HISTORY: Significant of father dying of an MI.   REVIEW OF SYSTEMS: GENERAL: Denies any fevers and chills. Complains of generalized weakness and fatigue which is chronic. Denies weight gain, weight loss.  EYES: Denies blurry vision, inflammation, glaucoma.  ENT: Denies tinnitus, ear pain, hearing loss, epistaxis, or discharge.  RESPIRATORY: Denies cough, wheezing, hemoptysis, COPD.  CARDIOVASCULAR: Denies any palpitation, any syncope, any orthopnea, but reports worsening lower extremity edema and had episode of chest pain.  GASTROINTESTINAL: Denies nausea, vomiting, diarrhea, abdominal pain, hematemesis, melena, jaundice.  GENITOURINARY: Denies dysuria, hematuria, renal colic.  ENDOCRINE: Denies polyuria, polydipsia, heat or cold intolerance.  HEMATOLOGY: Denies  anemia, easy bruising or bleeding diathesis. INTEGUMENTARY: Denies acne, rash, or skin lesions.  MUSCULOSKELETAL: Denies any gout or cramps. Reports history of arthritis. Reports lower extremity swelling.  NEUROLOGIC: Denies any history of CVA, TIA, headache, ataxia, vertigo,, anxiety, insomnia, or depression.   PHYSICAL EXAMINATION:  VITAL SIGNS: Temperature 97.9, pulse 90, respiratory rate 18, blood pressure 125/59, saturating 100% on oxygen.  GENERAL: Elderly female is comfortable in no apparent distress.  HEENT: Head atraumatic normocephalic pink conjunctivae, anicteric sclerae, moist oral mucosa.   NECK: Supple, no thyromegaly, no JVD, no carotid bruits.  CHEST: Good air entry bilaterally. No wheezing, rales, rhonchi.  CARDIOVASCULAR: S1, S2 heard. No rubs, gallops. Has a click heard from the mechanical valve. Irregular irregular pulses +2.  ABDOMEN: Soft, nontender, nondistended. Bowel sounds present. No hepatosplenomegaly.  EXTREMITIES: +1 edema bilaterally. No clubbing. No cyanosis. Radial and pedal pulses +2 bilaterally.  SKIN: No rash. No lesions. Warm and dry.  MUSCULOSKELETAL: Good range of motion throughout extremities.  PSYCHIATRIC: Appropriate affect. Awake, alert x3. Intact judgment and insight.  NEUROLOGIC: Cranial nerves grossly intact. Motor 5/5. No focal deficits.  LYMPHATIC: No cervical lymphadenopathy.   PERTINENT LABORATORY DATA: Glucose 149. BNP 2854, BUN 14, creatinine 1.09, sodium 137, potassium 4.1, chloride 108. Troponin less than 0.02. White blood cells 4.2, hemoglobin 11.4, hematocrit 30.4, platelet 213,000, INR 2.4.  IMAGING: Chest x-ray stable chest x-ray without evidence of acute cardiopulmonary process.   ASSESSMENT AND PLAN:  1. Chest pain. The patient presents with chest pain, currently resolved. The patient is known to have history of coronary artery disease, has few risk factors, so she will be admitted to telemetry unit to cycle her cardiac enzymes.  Initially first one is negative. The patient will be given 324 mg of aspirin. Should be on p.r.n. sublingual nitroglycerin. She is already on beta blockers and on losartan. We will consult cardiology to see if there is any further workup indicated at this point.  2. Congestive heart failure most likely diastolic. Echo done earlier this month. The patient will be started on IV Lasix. We will appreciate cardiology input. The patient will be on strict ins and outs and daily weight.  3. Atrial fibrillation. Initially rate was uncontrolled but currently controlled without any intervention. We will continue with sotalol and we will continue on anticoagulation.  4. Mitral valve replacement with mechanical valve. Current INR is almost therapeutic. Will consult pharmacy to dose warfarin.  5. Diabetes mellitus. We will hold oral hypoglycemic agents. We will keep patient on insulin sliding scale during the hospital stay.  6. Hypothyroidism. Continue with Synthroid.  7. Hypertension, acceptable off medication.  8. Glaucoma: Continue with home eyedrops.  9. Gastroesophageal reflux disease. Continue with proton pump inhibitor.  10. Deep vein thrombosis prophylaxis. The patient is on full dose anticoagulation with warfarin.   CODE STATUS: Discussed with the patient and her husband. The patient is full code.   TOTAL TIME SPENT ON ADMISSION AND PATIENT CARE: 55 minutes.    ____________________________ Albertine Patricia, MD dse:lt D: 09/05/2013 04:16:58 ET T: 09/05/2013 05:24:11 ET JOB#: [id number]  cc: Albertine Patricia, MD, <Dictator> Asaiah Hunnicutt Graciela Husbands MD ELECTRONICALLY SIGNED 09/06/2013 2:49

## 2014-09-29 NOTE — Consult Note (Signed)
Present Illness patient is a 79 year old female with history of a prosthetic mitral valve replacement with a [employer] bileaflet device. She had a history of rheumatic fever. She has been anticoagulated for this. She'll says a history of atrial fibrillation which was treated with sotalol. She has coronary artery disease status post coronary artery bypass grafting. She has a history of chronic diastolic heart failure. She presented with hyperosmolar coma. She had no chest pain. She was noted to have a urinary tract infection. Electrocardiogram on admission revealed deep T-wave inversions in the lateral leads. These changes were not present previously, in particular on electrocardiogram done in January of 2015. She had no chest pain and ruled out for a myocardial infarction. She was treated for her hyperosmolar coma with improvement. Electrocardiogram done today after her near complete resolution of her hyperglycemic state, revealed changes more  similar to her previous electrocardiograms. She remains asymptomatic from a cardiac standpoint. Echocardiogram done today reveals an EF of 50-55% with an adequately functioning mitral valve prosthesis. There is no evidence of endocarditis.   Physical Exam:  GEN well nourished, no acute distress   HEENT PERRL, hearing intact to voice   NECK supple   RESP normal resp effort  no use of accessory muscles   CARD Irregular rate and rhythm  Tachycardic   ABD denies tenderness  denies Flank Tenderness   LYMPH negative neck, negative axillae   EXTR negative cyanosis/clubbing, negative edema   SKIN normal to palpation, skin turgor decreased   NEURO cranial nerves intact, motor/sensory function intact   PSYCH A+O to time, place, person   Review of Systems:  Subjective/Chief Complaint weakness and fatigue   General: Fatigue  Weakness   Skin: No Complaints   ENT: No Complaints   Eyes: No Complaints   Neck: No Complaints   Respiratory: No Complaints    Cardiovascular: Palpitations   Gastrointestinal: No Complaints   Genitourinary: Frequent urination   Vascular: No Complaints   Musculoskeletal: No Complaints   Neurologic: No Complaints   Hematologic: No Complaints   Endocrine: No Complaints   Psychiatric: No Complaints   Review of Systems: All other systems were reviewed and found to be negative   Medications/Allergies Reviewed Medications/Allergies reviewed   EKG:  Abnormal NSSTTW changes   Interpretation atrial fibrillation with variable ventricular response. Had transient deep lateral T wave inversions    No Known Allergies:    Impression 79 year old female with history of diabetes, coronary artery disease status post coronary artery bypass grafting, history of mitral valvular disease secondary to traumatic fever and status post bileaflet mechanical valve prosthesis placement who presented with atrial fibrillation and hyperosmolar coma. She had deep T wave inversions in the lateral leads which were new from previously. She had no chest pain and rule out for myocardial infarction. Echo revealed no change in her LV function which was preserved inadequate valve function with no evidence of endocarditis. She was anticoagulated with warfarin for her valve. EKG done today reveals near complete resolution of her T-wave inversions. Review of the literature shows evidence in the [place] cardiology literaturer of reported cases of similar EKG findings and hyperosmolar coma. This appears to be do to electrolyte activity at the cellular level altering the electrocardiogram. She does not appear to be ischemic clinically or by current EKG. Would continue to resume her sotalol for her A. fib and attempts of controlling a rate.  May need to have intermittent beta blocker therapy to assist in rate control if it  is not achieved with sotalol. Would continue with warfarin with a goal INR of 2.5-3.5 do to her prosthetic mitral valve   Plan 1.  Continue aggressive treatment of her hyperglycemic state 2. Closely follow electrolytes as you're doing 3. Treat UTI as you're doing 4.  Resume sotalol to attempt to maintain sinus rhythm and/or controlled ventricular response. 5. Continue with warfarin to achieve an INR goal between 2.5 and 3.5 6. Will follow with you   Electronic Signatures: Teodoro Spray (MD)  (Signed 11-Mar-15 14:03)  Authored: General Aspect/Present Illness, History and Physical Exam, Review of System, EKG , Allergies, Impression/Plan   Last Updated: 11-Mar-15 14:03 by Teodoro Spray (MD)

## 2014-09-29 NOTE — Discharge Summary (Signed)
PATIENT NAME:  [name], [name] MR#:  [id number] DATE OF BIRTH:  [birth year]  DATE OF ADMISSION:  10/17/2013 DATE OF DISCHARGE:  10/24/2013  INDICATION FOR ADMISSION: Sick sinus syndrome, permanent pacemaker placement.   ADDITIONAL DIAGNOSES: Include atrial fibrillation and bradycardia.   PROCEDURE: Permanent pacemaker placement, dual-chamber, left side, by Dr. Saralyn Pilar.  HISTORY AND PHYSICAL: Dictated.   HOSPITAL COURSE: The patient was admitted with sick sinus syndrome, bradycardia, and atrial fibrillation. She has a mechanical mitral valve and was on Coumadin. Coumadin was stopped. She was admitted, placed on heparin to bridge her for the procedure. She underwent permanent pacemaker placement by Dr. Saralyn Pilar successfully and then was restarted on Coumadin for anticoagulation. It took several days to get her Coumadin levels therapeutic enough to be discharged. She did reasonably well, had no bleeding or pain and appeared to have adequately functioning pacemaker.   Again, the patient had the permanent pacemaker placed, did reasonably well. Had physical therapy to help with increased activity. She had teaching for her pacemaker placement. She had to have her Coumadin restarted and wait for her INR to become reasonably therapeutic prior to discharge and she did reasonably well and by May 19th that was accomplished and she was discharged home with follow-up to [name] to have her PT/INR checked in a reasonable time for necessary adjustments. She is to follow up with cardiology in about 2 weeks and home pacemaker monitoring was set up.   DISCHARGE MEDICATIONS: Medications on discharge were unchanged. She was on ferrous sulfate 325 once a day, vitamin B12, vitamin D, eyedrops for glaucoma, losartan 50 a day, levothyroxine 100 mcg a day, nitroglycerin p.r.n., omeprazole 40 mg a day, hydralazine 25 three times a day, Imdur 30 a day, Tylenol p.r.n., milk of magnesia p.r.n., promethazine 25 every 4 hours  p.r.n., Lasix 40 mg a day, Coumadin 4 mg a day, glipizide 2.5 extended-release.  TREATMENTS: Physical therapy.  DISCHARGE DIET: A 2000 calorie ADA.  REHABILITATION POTENTIAL: Good.   The patient is set up for home health. Stitches and Steri-Strips will be allowed to follow from now on. The patient is retired so not able to return to work.  DISCHARGE ACTIVITY LIMITATIONS: No heavy lifting.  Rehabilitation is  potential is reasonable.  ____________________________ Loran Senters. Clayborn Bigness, MD ddc:sb D: 11/14/2013 09:51:56 ET T: 11/14/2013 10:11:15 ET JOB#: [id number]  cc: Terease Marcotte D. Clayborn Bigness, MD, <Dictator> Yolonda Kida MD ELECTRONICALLY SIGNED 11/16/2013 11:08

## 2014-09-29 NOTE — Consult Note (Signed)
PATIENT NAME:  [name], [name] MR#:  [id number] DATE OF BIRTH:  25-Sep-1935  DATE OF CONSULTATION:  09/12/2013  REFERRING PHYSICIAN:  Dr.  Ramonita Lab CONSULTING PHYSICIAN:  Cheral Marker. Ola Spurr, MD  REASON FOR CONSULTATION: Pseudomonal bacteremia and cholecystitis.   HISTORY OF PRESENT ILLNESS: This is a very pleasant 79 year old female with a history of recent new onset atrial fibrillation, rheumatic heart disease status post mitral valve replacement, coronary artery disease status post bypass, diabetes, hypertension, hypothyroidism who was admitted 04/03 with complaints of abdominal pain, nausea and vomiting. She was recently discharged from the hospital after being treated for atrial fibrillation. She was found to have increased LFTs and an ultrasound which showed acute cholecystitis.   The patient was admitted, treated conservatively and seen by surgery. She has clinically improved with decreased abdominal pain, labs improving. Blood cultures are positive for pseudomonas. Followup blood cultures are no growth to date. We are consulted for further antibiotic management.   PAST MEDICAL HISTORY: 1.  Coronary artery disease.  2.  Rheumatic fever with rheumatic heart disease.  3.  Prior atrial fibrillation.  4.  Diabetes.  5.  Hypertension.  6.  Hypothyroidism.  7.  GERD.  8.  Glaucoma.   PAST SURGICAL HISTORY:  Status post mitral valve replacement status post coronary artery bypass surgery.   ALLERGIES: No known drug allergies.   SOCIAL HISTORY: The patient lives with her husband. She does not smoke or drink. She was starting to get physical therapy at home after her most recent admission.   FAMILY HISTORY: Positive for heart disease and diabetes in her father.  REVIEW OF SYSTEMS: Eleven systems reviewed and negative except as per HPI.   PHYSICAL EXAMINATION: VITAL SIGNS: Temperature for the last 24 hours 98. Pulse is 49, blood pressure 126/87, respirations 16, sat 99% on room air.   GENERAL: She is pleasant, sitting up in bed. She is in no acute distress but is somewhat disheveled. Her pupils are equal, round and reactive to light and accommodation. Oropharynx shows dry mucous membranes.  NECK: Supple.  HEART: Irregular.  LUNGS: Has bibasilar crackles about midway up, very fine in nature.  ABDOMEN: Obese, soft, nontender, nondistended. No hepatosplenomegaly.  EXTREMITIES: She has no clubbing, cyanosis or edema.  NEUROLOGIC: She is alert and oriented x 3. Grossly nonfocal neuro exam.   LABORATORY DATA: Blood cultures done on 04/03 are positive for pseudomonas sensitive to Cipro, levofloxacin, imipenem and ceftazidime. Followup blood cultures 04/07 are no growth to date. Urine culture 04/06 is negative. White blood count on admission was 3.2; currently it is 3.2. Hemoglobin 8.1, platelets 159. Renal function shows a creatinine of 1.23. LFTs show T-bili 0.7, alkaline phosphatase is 90. AST is 73 and ALT is 182. These are much improved from her labs on 04/03 when her bilirubin was 2.6, alkaline phosphatase 164, AST 871 and ALT 459.  IMAGING:  Ultrasound of her abdomen done on admission 04/03 showed evidence of gallstones, as well as acute cholecystitis. X-ray of her chest showed interstitial infiltrate with possible underlying pulmonary fibrosis. There was atelectasis versus infiltrate in the lingula. Followup chest x-ray 04/05 shows scarring left midlung, evidence of chronic bronchitis but no consolidation. HIDA scan 04/06 was negative.   IMPRESSION: A 79 year old pleasant female admitted with acute cholecystitis who also has positive blood cultures for pseudomonas. Followup blood cultures are clearing. Her LFTs are much improved.  RECOMMENDATION: 1.  The pseudomonal bacteremia is likely from her acute cholecystitis but this is most likely a  polymicrobial infection. She does have crackles on exam but they seem chronic and her chest x-ray suggests interstitial process. She does not  report thick sputum to suggest any evidence of pulmonary process or bronchiectasis.  2.  I would recommend continuing levofloxacin but adding anaerobic coverage with Flagyl. This can be done 500 mg twice a day.  3.  Since she is having diarrhea, Clostridium difficile sample is pending.  4.  I would recommend a 14-day course total from time of blood cultures positivity on the 3rd. This would give a stop date of 04/17.  Thank you for the consult. I will be glad to follow with you.   ____________________________ Cheral Marker. Ola Spurr, MD dpf:ce D: 09/12/2013 16:34:38 ET T: 09/12/2013 17:16:53 ET JOB#: [id number]  cc: Cheral Marker. Ola Spurr, MD, <Dictator> Tait Balistreri Ola Spurr MD ELECTRONICALLY SIGNED 09/19/2013 22:15

## 2014-09-29 NOTE — Consult Note (Signed)
PATIENT NAME:  [name], [name] MR#:  [id number] DATE OF BIRTH:  08-27-1935  DATE OF CONSULTATION:  09/18/2013  PRIMARY CARE PHYSICIAN:  Dr. Ramonita Lab  REFERRING PHYSICIAN:  Dr. Laurin Coder  CONSULTING PHYSICIAN:  Isaias Cowman, MD  CHIEF COMPLAINT: Chest pain.   REASON FOR CONSULTATION: Consultation requested for evaluation of atrial fibrillation.   HISTORY OF PRESENT ILLNESS: The patient is a 79 year old female with history of paroxysmal atrial fibrillation, valvular heart disease, status post mitral valve replacement, with recent hospitalization for acute cholecystitis and Pseudomonas bacteremia, who is readmitted with atrial fibrillation with a rapid ventricular response. The patient was recently hospitalized for abdominal pain diagnosed with acute cholecystitis complicated by Pseudomonas bacteremia. The patient was treated with intravenous antibiotics. During the hospitalization, the patient experienced bradycardia, which was resolved after discontinuing sotalol. The patient was transferred to [facility]. The patient was doing relatively well until early this morning, when she experienced palpitations. Was sent to [facility] Emergency Room, where she was noted to be in atrial fibrillation, with a ventricular rate of 120 to 130. The patient was treated with Cardizem bolus and is admitted to telemetry. Admission labs were notable for a troponin of 1.4. The patient was placed on Cardizem 30 mg q. 6 hours. Heart rate appears to be better controlled at a rate of 93 BPM. The patient reports feeling better, currently denies chest pain.   PAST MEDICAL HISTORY: 1.  Paroxysmal atrial fibrillation.  2.  Valvular heart disease, status post mitral valve replacement.  3.  Hypertension.  4.  Chronic diastolic congestive heart failure. 5.  Recent hospitalization for acute cholecystitis, complicated by Pseudomonas bacteremia.  6.  Hypothyroidism.  7.  Gastroesophageal reflux disease. 8.   Parkinsonism.   9.   Anemia of chronic disease.   MEDICATIONS ON ADMISSION: Warfarin 3 mg daily, losartan 50 mg daily, Lovenox 1 mg/kg until INR reaches 2.5, vitamin B12, vitamin D3, ofloxacin optic drops, metronidazole 500 mg b.i.d., levothyroxine 100 mcg daily, levofloxacin 750 mg every other day, glipizide 5 mg daily.   SOCIAL HISTORY: The patient is married, normally  resides with her husband. She currently is at [facility]. She has a 30 pack-year tobacco abuse history, quit 20 years ago.   FAMILY HISTORY: Positive for coronary artery disease.  REVIEW OF SYSTEMS:  CONSTITUTIONAL: The patient has had no fever or chills.  EYES: No blurry vision.  EARS: No hearing loss.  RESPIRATORY: No shortness of breath.  CARDIOVASCULAR: Palpitations as described above.  GASTROINTESTINAL: The patient denies nausea or vomiting, has some diarrhea.  GENITOURINARY: No dysuria or hematuria.  ENDOCRINE: No polyuria or polydipsia.  MUSCULOSKELETAL: No arthralgias or myalgias.  NEUROLOGICAL: No focal muscle weakness or numbness.  PSYCHOLOGICAL: No depression or anxiety.   PHYSICAL EXAMINATION: VITAL SIGNS: Blood pressure 146/79, pulse 93, respirations 18, temperature 97.9, pulse ox 100%.  HEENT: Pupils equal, reactive to light and accommodation.  NECK: Supple, without thyromegaly.  LUNGS: Clear.  CARDIOVASCULAR:  Normal JVP. Normal PMI. Irregular  rhythm. Normal S1, S2. No appreciable gallop, murmur, or rub.  ABDOMEN: Soft and nontender.  PULSES:  Intact bilaterally.  MUSCULOSKELETAL: Normal muscle tone.  NEUROLOGIC: The patient is alert and oriented x 3. Motor and sensory both grossly intact.   IMPRESSION: A 79 year old female with known paroxysmal atrial fibrillation, bradycardia on sotalol, valvular heart disease, status post mitral valve replacement, with recent hospitalization for acute cholecystitis and Pseudomonas bacteremia. The patient now admitted with atrial fibrillation with a rapid ventricular rate, which appears  better  controlled on Cardizem low dose. The patient has elevated troponin. I suspect this is due to demand/supply ischemia due to atrial fibrillation. The patient currently is chest pain-free.   RECOMMENDATIONS: 1.  Agree with overall current therapy.  2.  Continue Cardizem for rate control.  3.  Defer sotalol for now.  4.  Would defer further cardiac diagnostics at this time.  5. If patient continues to improve clinically with atrial fibrillation with good rate control, then would defer antiarrhythmic or pacemaker implantation at this time. As long as patient remains clinically stable, with no evidence of recurrent cholecystitis or bacteremia, then will plan for permanent pacemaker implantation in the near future.      ____________________________ Isaias Cowman, MD ap:mr D: 09/18/2013 17:44:20 ET T: 09/18/2013 19:15:24 ET JOB#: [id number]  cc: Isaias Cowman, MD, <Dictator> Isaias Cowman MD ELECTRONICALLY SIGNED 10/10/2013 12:45

## 2014-09-29 NOTE — Discharge Summary (Signed)
PATIENT NAMEDANIYAH, [name] MR#:  [id number] DATE OF BIRTH:  September 12, 1935  DATE OF ADMISSION:  09/05/2013 DATE OF DISCHARGE:  09/07/2013  FINAL DIAGNOSES: 1. Atrial fibrillation with rapid ventricular rate.  2. Acute on chronic left-sided diastolic congestive heart failure secondary to #1.  3. Diabetes mellitus.  4. Parkinson's disease.  5. Coronary artery disease and coronary artery bypass surgery.  6. Mechanical mitral valve replacement.  7. Hypothyroidism.  8. Gastroesophageal reflux disease.   HISTORY AND PHYSICAL: Please see dictated admission history and physical.   HOSPITAL COURSE: The patient was admitted after having palpitations and increasing shortness of breath with chest discomfort. Enzymes were negative. Cardiology saw the patient. She underwent stress testing which was negative. It was felt most likely that she had an episode of atrial fibrillation with rapid rate and developed some heart failure secondary to this. Sotalol was continued and her rate remained controlled. She diuresed effectively as well.   She was recently taken off Coumadin as we were concerned that she would need to go for cardiac catheterization. Coumadin was resumed; however, her INR is subtherapeutic in a patient with mechanical heart valve, and so she will be continued on Lovenox. It was felt that she can likely go home to continue Lovenox until INR is therapeutic and so she will be discharged to home in stable condition with physical activity up with walker as tolerated. She should check her blood sugar daily and record this. She should weigh herself daily and call for more than 2 pounds gain in one day or 5 pounds in one week or increasing signs or symptoms of heart failure including weight gain, fatigue, dyspnea, edema, etc. This was discussed with the patient's husband as well. She will follow up in our office within 1 to 2 weeks, but she will need a stat INR on Monday morning. She should follow a 2 gram  sodium 1800 calorie ADA diet.   DISCHARGE MEDICATIONS: 1. Omeprazole 20 mg 2 tablets p.o. b.i.d.  2. Ferrous sulfate 325 mg p.o. daily.  3. Vitamin B12, 250 mcg p.o. daily.  4. Vitamin D 1000 units p.o. daily.  5. Calcium 600 mg p.o. daily.  6. Levothyroxine 0.1 mg p.o. daily.  7. Sotalol 40 mg p.o. b.i.d.  8. Latanoprost one drop to each eye at bedtime.  9. Brimonidine one drop to each eye b.i.d.  10. Losartan 100 mg 1/2 tablet p.o. daily.  11. Coumadin 3 mg p.o. at bedtime.  12. Glipizide ER 5 mg p.o. daily.  13. Lovenox 60 mg subcutaneous b.i.d. x 4 days.  14. Lasix 40 mg p.o. daily as needed for edema.  15. Potassium tablets p.o. daily as needed with furosemide.   ____________________________ Adin Hector, MD bjk:sg D: 09/07/2013 94:58:59 ET T: 09/07/2013 09:47:13 ET JOB#: [id number]  cc: Adin Hector, MD, <Dictator> Ramonita Lab MD ELECTRONICALLY SIGNED 09/11/2013 12:42

## 2014-09-29 NOTE — Consult Note (Signed)
PATIENT NAME:  [name], [name] MR#:  [id number] DATE OF BIRTH:  1936/04/15  DATE OF CONSULTATION:  09/08/2013  CONSULTING PHYSICIAN:  Consuela Mimes, MD  REASON FOR CONSULTATION: Acute cholecystitis.   HISTORY OF PRESENT ILLNESS: [name] is a 79 year old African American female who was discharged from the hospital yesterday with new-onset atrial fibrillation. She yesterday experienced epigastric abdominal pain, nausea and vomiting, and was seen in the Emergency Room and also found to have an altered mental status. She apparently ate supper last night but woke up having abdominal pain, nausea and vomiting, and was also constipated. She is communicative this evening but minimally so, and so the history is partly from the electronic health record, partly from her husband, and partly from the patient herself. Apparently, she has not had a fever and has not had any chills.   REVIEW OF SYSTEMS: Negative for 10 systems except as mentioned in the history of present illness, specifically regarding the gastrointestinal system.   PAST MEDICAL HISTORY: Rheumatic heart disease, status post mechanical mitral valve replacement, coronary artery disease, status post coronary artery bypass grafts, recent-onset atrial fibrillation, diabetes mellitus, hypertension, hypothyroidism, gastroesophageal reflux disease, glaucoma.   ALLERGIES: None.   SOCIAL HISTORY: The patient lives with her husband. She does not use alcohol, smoke cigarettes, or use any illicit drugs.   FAMILY HISTORY: Noncontributory.   MEDICATIONS: Brimonidine ophthalmic solution, calcium carbonate, Lovenox, iron sulfate, glipizide, latanoprost eye drops, Synthroid, losartan, omeprazole, potassium, sotalol, vitamin B12, vitamin D3, Coumadin.   PHYSICAL EXAMINATION: GENERAL: Elderly African American woman lying comfortably on the stretcher who is arousable, but is minimally communicative.  VITAL SIGNS: Height 5 feet 2 inches, weight 130  pounds, BMI 23.8. Temperature 99.3, pulse 63, respirations 20, blood pressure 127/71, oxygen saturation 97% on room air at rest.  HEENT: Pupils equally round and reactive to light. Extraocular movements intact. Sclerae anicteric. Oropharynx clear. Mucous membranes moist. Hearing intact to voice.  NECK: Supple with no jugular venous distention, tracheal deviation or lymphadenopathy.  HEART: Irregularly irregular with a mechanical valve click, no murmurs and no rubs.  LUNGS: Clear to auscultation with normal respiratory effort bilaterally.  ABDOMEN: Soft, flat, nondistended, nontender, with no hepatosplenomegaly or other masses appreciated.  EXTREMITIES: No edema, with normal capillary refill bilaterally.  NEUROLOGIC: Cranial nerves II through XII, motor and sensation grossly intact.  PSYCHIATRIC: Arousable with a flat affect, but appears to understand the questions and answer them appropriately.   LABORATORY AND RADIOLOGICAL DATA: BNP 3 days ago 2854. Electrolytes normal. Creatinine 1.2. Bilirubin 2.6, alkaline phosphatase 164, SGOT 871, SGPT 459, albumin 3.6. Cardiac enzymes over the last 4 days are normal. White blood cell count 5.3, hemoglobin 11, hematocrit 34%, platelet count 190,000. PT has diminished over the last 4 days from 26 to 21. INR has gone down from 2.4 to 1.8, and PTT is 45. Urinalysis today is normal.   The patient underwent a stress test 2 days ago while hospitalized and only achieved a maximum heart rate of 128, but her ejection fraction was 67% and there were no wall motion abnormalities.  Today, she has had a CT scan of the head without contrast, which reveals multiple prior infarcts but no obvious acute disease. A chest x-ray, which had no acute abnormality. An ultrasound of the abdomen, which revealed a 6 mm common bile duct, a thickened wall of the gallbladder with gallstones, and pericholecystic fluid consistent with acute cholecystitis.   ASSESSMENT AND PLAN: Acute  cholecystitis in a patient  with very recent onset atrial fibrillation and recent change in her anticoagulation. More concerning than this is her altered mental status as well as her heart disease. I agree with your selection of IV antibiotics, and we will follow her including her bilirubin, which is elevated, along with her significantly elevated hepatic transaminases to see if perhaps she has a common bile duct stone. Once she is deemed an appropriate operative candidate and accepts the risks of surgery, she will need to have her temporary anticoagulation of IV heparin discontinued for a period of time in order to undergo laparoscopic cholecystectomy.   ____________________________ Consuela Mimes, MD wfm:jcm D: 09/08/2013 20:44:16 ET T: 09/08/2013 21:13:24 ET JOB#: [id number]  cc: Consuela Mimes, MD, <Dictator> Consuela Mimes MD ELECTRONICALLY SIGNED 09/09/2013 19:49

## 2014-09-29 NOTE — H&P (Signed)
PATIENT NAME:  [name], [name] MR#:  [id number] DATE OF BIRTH:  [birth year]  DATE OF ADMISSION:  09/18/2013  REFERRING PHYSICIAN: Dr. Charlesetta Ivory.   PRIMARY CARE PHYSICIAN: Dr. Ramonita Lab.   HISTORY OF PRESENT ILLNESS: Very nice 79 year old female discharged on 09/15/2013 after a hospitalization from March 31st for over 12 days when she was admitted with abdominal pain. The patient was found out to have cholecystitis, which was acute complicated with Pseudomonas bacteremia and sepsis and metabolic encephalopathy. The patient also had left side diastolic dysfunction and chronic atrial fibrillation. The patient has mechanical valve replacement due to rheumatic heart disease and she is on chronic anticoagulation with warfarin. The patient was taking Lovenox at discharge up until her INR was therapeutic. At this moment, her INR is still not quite therapeutic, it is 2.1. Her goal is to be up to 2.5. The patient comes today with a history of waking up at 3 in the morning to go to the bathroom. Once she started walking to the bathroom, started feeling some chest pressure and significant pounding at the level of her chest. The patient states that whenever she went to bed she was her normal self. She has been not fully recovered from her previous hospitalization but was feeling much better. She was discharged to [facility] back on Friday and the plan was to start physical therapy today. Over the weekend she did not do any anything different. No significant exertional activity. She denies any chest pain or palpitations up until 3:00 a.m. last night. The palpitations lasted all night to the point that she was not able to go back to sleep. The chest pain lasted for 15 minutes, located in the middle of the chest radiating to the left and up in the shoulder, associated with left arm numbness. The intensity was 7/10 and resolved by itself after she sat down on the bed. There was some mild shortness of breath associated to  that, but also resolved once the chest pain went away. The patient is admitted to the hospital as her heart rate was in the 130s, receive Cardizem bolus and her heart rate now is in the 90s.   REVIEW OF SYSTEMS: A 12-system review of systems is done.  CONSTITUTIONAL: No fever. Positive fatigue, positive weakness. No significant weight loss or weight gain.  EYES: No blurry vision, double vision or inflammation.  EARS, NOSE, THROAT: No difficulty swallowing or tinnitus.  RESPIRATORY: No cough, wheezing, or hemoptysis,   CARDIOVASCULAR: Positive chest pain as mentioned above. No orthopnea. Trace edema. No syncopal episodes. Positive palpitations.  GASTROINTESTINAL: No nausea, vomiting, abdominal pain, constipation, or diarrhea. The patient states that she has significant lack of appetite and she had nausea last night at 3:00 a.m. when the chest pain, came in,  but now is resolved.  GENITOURINARY: No history of hematuria, changes in frequency.  GYNECOLOGIC: No breast masses.  ENDOCRINE: No polyuria, polydipsia, polyphagia, cold or heat intolerance. The patient had increased TSH after discharge last time.  HEMATOLOGIC AND LYMPHATIC: No anemia, easy bruising or bleeding. The patient is on anticoagulation with Coumadin.  MUSCULOSKELETAL: No significant neck pain or back pain.  NEUROLOGIC: No numbness, tingling. No CVAs or transient ischemic attacks.  PSYCHIATRIC: No insomnia, depression, or nervousness.  SKIN: No rashes or petechiae.   PAST MEDICAL HISTORY: 1. Status post hospitalization with acute cholecystitis.  2. Pseudomonas bacteremia.  3. Sepsis due to Pseudomonas bacteremia now resolved.  4. Hypertension.  5. Chronic diastolic congestive heart failure, compensated.  6. Rheumatic heart disease, status post mitral valves replaced with mechanical valve, [employer].  7. Coronary artery disease.  8. Atrial fibrillation, chronic.  9. Diabetes.  10. Hypertension.  11. Hypothyroidism.  12. GERD.   13. Glaucoma.  14. Parkinsonism.  15. Chronic disease anemia.  16. Glaucoma.   PAST SURGICAL HISTORY:  1. Coronary artery bypass graft.  2. Mitral replacement.  3. Hysterectomy.   ALLERGIES: No known drug allergies.   SOCIAL HISTORY: The patient used to smoke. She smoked for over 30 years, she quit 20 years ago. She does not have use any alcohol. She was living with her husband at home. They are both retired, but recently she was discharging to [place] facility.   FAMILY HISTORY: Positive for heart disease in parents and then positive for diabetes on  father.   CURRENT MEDICATIONS:  1. Vitamin B12.  2. Vitamin D3.  3. Warfarin 3 mg once daily.  4. Ofloxacin otic drops.  5. Metronidazole 500 mg every 12 hours. Stop day on 09/21/2013.  6. Losartan 50 mg once daily.  7. Levothyroxine 100 mcg once daily.  8. Levofloxacin 750 mg every other day. Stop day on 09/23/2013.  9. Latanoprost. 10. Glipizide 5 mg once a day. 11. Ferrous sulfate 325 mg daily.  12. Lovenox 1 mg/kg until INR reaches 2.5.  13. Brimonidine eyedrops.   PHYSICAL EXAMINATION: VITAL SIGNS: Blood pressure of 142/74, pulse 92, respirations 18. Highest pulse was 132 before potassium, oxygen saturation 96% on 2 liters nasal cannula.  GENERAL: The patient is alert, oriented x 3, in no acute distress. No respiratory distress. Hemodynamically stable.  HEENT: Pupils are equal and reactive. Extraocular movements are intact. Mucosa are moist. Anicteric sclerae. Pink conjunctivae. No oral lesions. No oropharyngeal exudates.  NECK: Supple. No JVD. No thyromegaly. No adenopathy. No carotid bruits.  CARDIOVASCULAR: Irregularly irregular. No rubs, gallops. Positive murmur and click from mitral mechanical valve replacement. No displacement of PMI. Mild tenderness to palpation of anterior chest wall but  chest pain is   reproducible as yesterday.  LUNGS: Clear without any wheezing or crepitus. No use of accessory muscles. There are  some crackles on bases, which seem to be chronic. No use of accessory muscles.   ABDOMEN: Soft, nontender, nondistended. No hepatosplenomegaly. No masses. Bowel sounds are positive. No rebound tenderness.  GENITAL: Negative except for external lesions. The patient is wearing a diaper.  EXTREMITIES: Trace edema of the lower extremities, pulses +2. Capillary refill less than 3. No cyanosis or clubbing.  NEUROLOGIC: Cranial nerves II through XII  intact. No focal findings.  PSYCHIATRIC: No agitation. The patient has flat affect.  SKIN: No rashes, petechiae, or new lesions. There is some ecchymosis the level of the abdomen due to injections with Lovenox.  LYMPHATIC: Negative in the (axilary and  supraclavicular areas.  MUSCULOSKELETAL: No joint effusions or swelling.   LABORATORY RESULTS: INR is 2.1.   EKG: Atrial fibrillation with RVR some ST wave inversions at the level of V4, V5, V6.  Hemoglobin is 10, platelet count 345, white count is 5.4, TSH 6.11. Troponin is elevated at 0.17. Previous troponin was 0.02, it had been normal prior. Glucose is 106. BUN 4. Creatinine is 0.8, potassium 3.7, magnesium 1.7.   Chest x-ray shows significant vascular congestion, this seems also chronic, chronic bronchitic changes bilaterally, interstitial edema cannot be excluded although compared with previous x-ray there is no significant difference.   ASSESSMENT AND PLAN: A 79 year old female with history of atrial fibrillation, coronary  artery disease mechanical valve replacement on the mitral valve due to rheumatic heart disease. The patient  is admitted with atrial fibrillation with RVR.  1. Atrial fibrillation with rapid ventricular response. The patient has history of chronic atrial fibrillation. At this moment she is not on any rate control agent. Her previous medication contained Sotalol, but no other beta blockers, and the patient was not on any calcium channel blockers.  When the patient was discharged last  time the sotalol was not continued. At this moment the patient is also having acute coronary syndrome with elevation of troponin, chest pain last night that could be secondary to the atrial fibrillation, but for now, we are going to treat it as an acute coronary syndrome.  2. Acute coronary syndrome. The patient has elevation of troponin of 0.1. Aspirin 81 mg to continued for now. The patient needs some warfarin, but her INR is still not therapeutic, for which the patient is going to continue with Lovenox subcutaneous injections. The patient also has nitroglycerin, p.r.n. morphine and beta blocker has been added on to her medications, IV for now, but could be changed for p.o. tomorrow or later. Due to atrial fibrillation the patient is going to put him Cardizem p.o. 30 mg every six hours. Heart rate is still in the 90s. For troponins, cardiology consultation requested. The patient has been told in the past that she might need a permanent pacemaker. The patient had episodes of bradycardia in the past for what the sotalol was stopped on the previous hospitalization. The patient has been told that she might need a permanent pacemaker.  3. Hypertension. At this moment seems to be stable. Continue losartan. Add on IV beta blocker, add on Cardizem.  4. History of sepsis and septicemia with Pseudomonas. Continue levofloxacin as the patient is on Coumadin. Monitor INR closely.  5. Coronary artery disease, as mentioned above.  6. Diabetes. The patient is overall well controlled. Continue glipizide.  7. Hypothyroidism. Her TSH is elevated today. We can increase the dose of levothyroxine at 100 mcg. Last time her TSH was depressed, but was likely secondary to acute disease. At this moment, we recommended to follow up TSH in the next four weeks.  8. Chronic disease anemia. Continue iron.  9. Hypomagnesemia. Give  2 grams of magnesium, today.  10. Other medical problems are stable.  11. Deep vein thrombosis  prophylaxis. The patient is on Lovenox bridge and Coumadin.  12. Gastrointestinal prophylaxis with Pepcid.   I will transfer care to Dr. Ramonita Lab.  I spent about 55 minutes with this admission.    ____________________________ Mandaree Sink, MD rsg:sg D: 09/18/2013 09:15:20 ET T: 09/18/2013 10:46:38 ET JOB#: [id number]  cc: Harriman Sink, MD, <Dictator> Leyani Gargus America Brown MD ELECTRONICALLY SIGNED 09/24/2013 5:23

## 2014-09-29 NOTE — Consult Note (Signed)
Chief Complaint:  Subjective/Chief Complaint Sleeping in bed . Feels ok but not as active today.   VITAL SIGNS/ANCILLARY NOTES: **Vital Signs.:   17-May-15 12:00  Vital Signs Type Routine  Temperature Temperature (F) 97.7  Celsius 36.5  Temperature Source oral  Pulse Pulse 85  Respirations Respirations 18  Systolic BP Systolic BP 478  Diastolic BP (mmHg) Diastolic BP (mmHg) 78  Mean BP 93  Pulse Ox % Pulse Ox % 99  Pulse Ox Activity Level  At rest  Oxygen Delivery Room Air/ 21 %  *Intake and Output.:   Daily 17-May-15 07:00  Grand Totals Intake:  628.8 Output:  1200    Net:  -571.2 24 Hr.:  -571.2    11:21  Grand Totals Intake:  240 Output:      Net:  240 24 Hr.:  480  Oral Intake      In:  240  Unmeasured Intake  Few bites   Brief Assessment:  GEN well developed, well nourished, no acute distress   Cardiac Regular  murmur present  --Gallop  clicks from MVR   Respiratory normal resp effort  clear BS  rhonchi   Gastrointestinal Normal   Gastrointestinal details normal Soft   EXTR negative cyanosis/clubbing, negative edema   Lab Results: Routine Coag:  17-May-15 01:39   Activated PTT (APTT)  67.2 (A HCT value >55% may artifactually increase the APTT. In one study, the increase was an average of 19%. Reference: "Effect on Routine and Special Coagulation Testing Values of Citrate Anticoagulant Adjustment in Patients with High HCT Values." American Journal of Clinical Pathology 2006;126:400-405.)  Prothrombin  15.7  INR 1.3 (INR reference interval applies to patients on anticoagulant therapy. A single INR therapeutic range for coumarins is not optimal for all indications; however, the suggested range for most indications is 2.0 - 3.0. Exceptions to the INR Reference Range may include: Prosthetic heart valves, acute myocardial infarction, prevention of myocardial infarction, and combinations of aspirin and anticoagulant. The need for a higher or lower target  INR must be assessed individually. Reference: The Pharmacology and Management of the Vitamin K  antagonists: the seventh ACCP Conference on Antithrombotic and Thrombolytic Therapy. GNFAO.1308 Sept:126 (3suppl): N9146842. A HCT value >55% may artifactually increase the PT.  In one study,  the increase was an average of 25%. Reference:  "Effect on Routine and Special Coagulation Testing Values of Citrate Anticoagulant Adjustment in Patients with High HCT Values." American Journal of Clinical Pathology 2006;126:400-405.)    07:43   Activated PTT (APTT)  80.3 (A HCT value >55% may artifactually increase the APTT. In one study, the increase was an average of 19%. Reference: "Effect on Routine and Special Coagulation Testing Values of Citrate Anticoagulant Adjustment in Patients with High HCT Values." American Journal of Clinical Pathology 2006;126:400-405.)   Radiology Results: XRay:    13-May-15 14:59, Chest Portable Single View  Chest Portable Single View   REASON FOR EXAM:    Pacemaker  COMMENTS:       PROCEDURE: DXR - DXR PORTABLE CHEST SINGLE VIEW  - Oct 18 2013  2:59PM     CLINICAL DATA:  Post op Pacemaker    EXAM:  PORTABLE CHEST - 1 VIEW    COMPARISON:  DG CHEST 1V PORT dated 10/10/2013    FINDINGS:  Low lung volumes. Cardiac silhouette is enlarged. Patient is status  post median sternotomy coronary artery bypass grafting. Persistent  diffuse interstitial prominence with slight decrease conspicuity.  Linear areas of increased density left  lung base. No acute osseous  abnormalities. Stable left chest wall cardiac pacing unit.     IMPRESSION:  Decreased pulmonary edema    Atelectasis versus infiltrate left lung base      Electronically Signed    By: Margaree Mackintosh M.D.    On: 10/18/2013 15:13       Verified By: Mikki Santee, M.D., MD  Cardiology:    12-May-15 18:08, ECG  Ventricular Rate 81  Atrial Rate 81  QRS Duration 86  QT 370  QTc 429  R Axis -30  T  Axis 108  ECG interpretation   Atrial fibrillation  Left axis deviation  Minimal voltage criteria for LVH, may be normal variant  Nonspecific T wave abnormality  Abnormal ECG  When compared with ECG of 10-Oct-2013 05:51,  Inverted T waves have replaced nonspecific T wave abnormality in Lateral leads  ----------unconfirmed----------  Confirmed by OVERREAD, NOT ([id number]), editor PEARSON, BARBARA ([id number]) on 10/18/2013 12:51:47 PM  ECG     14-May-15 09:43, Echo Doppler  Echo Doppler   REASON FOR EXAM:      COMMENTS:       PROCEDURE: [facility] - ECHO DOPPLER COMPLETE(TRANSTHOR)  - Oct 19 2013  9:43AM     RESULT: Echocardiogram Report    Patient Name:   [name] [facility] Date of Exam: 10/19/2013  Medical Rec #:  [id number]  Custom1:  Date of Birth:  27-May-1936                Height:       63.0 in  Patient Age:    79 years                Weight:       123.5 lb  Patient Gender: F                       BSA:          1.58 m??    Indications: Pericardial Effusion  Sonographer:    Sherrie Sport RDCS  Referring Phys: Lujean Amel, D    Summary:   1. Left ventricular ejection fraction, by visual estimation, is 30 to   35%.   2. Moderately to severely decreased global left ventricular systolic   function.   3. Mildly increased left ventricular septal thickness.   4. Mildly dilated left atrium.   5. Mildly dilated right atrium.   6. Mild to moderate mitral valve regurgitation.   7. Mild to moderate aortic valve sclerosis/calcification without any   evidence of aortic stenosis.   8. Mild to moderate tricuspid regurgitation.   9. Moderately increased left ventricular posterior wall thickness.  10. Inferior posterior severe hypo/Moderate global hypo.  76. Well seated metallic prosthetic valve.  12. Thickened tricuspid valve.  2D AND M-MODE MEASUREMENTS (normal ranges within parentheses):  Left Ventricle:          Normal  IVSd (2D):      1.49 cm (0.7-1.1)  LVPWd (2D):     1.68 cm (0.7-1.1) Aorta/LA:                   Normal  LVIDd (2D):     3.93 cm (3.4-5.7) Aortic Root (2D): 3.00 cm (2.4-3.7)  LVIDs (2D):     3.55 cm           Left Atrium (2D): 5.00 cm (1.9-4.0)  LV FS (2D):      9.7 %   (>25%)  LV  EF (2D):     21.6 %   (>50%)                                    Right Ventricle:                                    RVd (2D):        5.36 cm  LV DIASTOLIC FUNCTION:  MV Peak E: 1.34 m/s E/e' Ratio: 24.50                      Decel Time: 151 msec  SPECTRAL DOPPLER ANALYSIS (where applicable):  Mitral Valve:  MV Max Vel:   1.75 m/s MV P1/2 Time: 43.79 msec  MV Mean Grad: 3.0 mmHg MV Area, PHT: 5.02 cm??  Aortic Valve: AoV Max Vel: 1.07 m/s AoV Peak PG: 4.6 mmHg AoV Mean PG:   3.0 mmHg  LVOT Vmax: 0.40 m/s LVOT VTI: 0.082 m LVOT Diameter: 2.00 cm  AoV Area, Vmax: 1.18 cm?? AoV Area, VTI: 1.25 cm?? AoV Area, Vmn: 1.24 cm??  Tricuspid Valve and PA/RV Systolic Pressure: TR Max Velocity: 2.70 m/s RA   Pressure: 5 mmHg RVSP/PASP: 34.2 mmHg  Pulmonic Valve:  PV Max Velocity: 1.08 m/s PV Max PG: 4.7 mmHg PV Mean PG:    PHYSICIAN INTERPRETATION:  Left Ventricle: The left ventricular internal cavity size was normal. LV   septal wall thickness was mildly increased. LV posterior wall thickness   was moderately increased. Global LV systolic function was moderately to   severely decreased. Left ventricular ejection fraction, by visual   estimation, is 30 to 35%.  Right Ventricle: The right ventricular size is mildly enlarged. Global RV   systolic function is low normal.  Left Atrium: The left atrium is mildly dilated.  Right Atrium: The right atrium is mildly dilated.  Pericardium: There is no evidence of pericardial effusion.  Mitral Valve: Mild to moderate mitral valve regurgitation is seen. Well   seated metallic prosthetic valve.  Tricuspid Valve: The tricuspid valve is thickened. Mild to moderate   tricuspid regurgitation is visualized. The tricuspid regurgitant velocity   is 2.70 m/s, and  with an assumed right atrial pressure of 5 mmHg, the     estimated right ventricular systolic pressure is normal at 34.2 mmHg.  Aortic Valve: The aortic valve is normal. Mild to moderate aortic valve   sclerosis/calcification is present, without any evidence of aortic   stenosis. No evidence of aortic valve regurgitation is seen.  Pulmonic Valve: The pulmonic valve is normal.    Sagamore MD  Electronicallysigned by Omro MD  Signature Date/Time: 10/19/2013/9:04:28 PM    *** Final ***    IMPRESSION: .      Verified By: Yolonda Kida, M.D., MD   Assessment/Plan:  Invasive Device Daily Assessment of Necessity:  Does the patient currently have any of the following indwelling devices? none   Assessment/Plan:  Assessment IMP POD x 4 S/P Pacer placement HTN CAD AFIB MVR metal CHF DM SOB .   Plan PLAN Increase coumadin dose today at 8 mg POD 4 PPM daul chamber Continue heparin IV for anticoug Continue coumadin today for long term anticoug Agree with DM control Rate control /Rhythm control with Sotolol HTN control with usual meds Increase activity PT/OT  Antibx post op Awaiting PT/INRtobe theraputic prior to d/c Would prefer INR at 3-3,5   Electronic Signatures: Lujean Amel D (MD)  (Signed 18-May-15 18:20)  Authored: Chief Complaint, VITAL SIGNS/ANCILLARY NOTES, Brief Assessment, Lab Results, Radiology Results, Assessment/Plan   Last Updated: 18-May-15 18:20 by Yolonda Kida (MD)

## 2014-09-29 NOTE — Consult Note (Signed)
Brief Consult Note: Diagnosis: Otitis externa.   Patient was seen by consultant.   Comments: Cerumen impaction patient picking at it has caused OE with bleeding.  Nurses to clean with peroxide and instill floxin drops (ordered).  Will followup with me in 10 days in clinic.  Note to be dictated.  Electronic Signatures: Roena Malady (MD)  (Signed 09-Apr-15 12:06)  Authored: Brief Consult Note   Last Updated: 09-Apr-15 12:06 by Roena Malady (MD)

## 2014-09-29 NOTE — Discharge Summary (Signed)
PATIENT NAMECRYSTOL, [name] MR#:  [id number] DATE OF BIRTH:  07/15/1935  DATE OF ADMISSION:  09/18/2013 DATE OF DISCHARGE:  09/21/2013  FINAL DIAGNOSES:  1.  Atrial fibrillation with rapid ventricular rate.  2.  Demand ischemia secondary to #1.  3.  Recent cholecystitis. Gallbladder surgery was deferred secondary to electrical instability in the heart.  4.  Recent bradycardia with sick sinus syndrome.  5.  Chronic left-sided diastolic congestive heart failure.  6.  Mechanical cardiac valvular heart disease.  7.  Right otitis externa.  8.  Chronic blood loss anemia with chronic iron deficiency.  9.  Parkinson's.  10. Recent Pseudomonas bacteremia.  11. Adult-onset diabetes mellitus.   HISTORY AND PHYSICAL: Please see dictated admission history and physical.   HOSPITAL COURSE: The patient was admitted with A. fib. with rapid ventricular rate. She recently had been hospitalized after being found to have cholecystitis with Pseudomonas bacteremia and she had profound bradycardia during that episode. She had been on sotalol and this was discontinued. She was not able to be placed on any rate-limiting medicines at that time secondary to the bradycardia. She was placed on Cardizem and responded well to that, fortunately, without further bradycardia. She was continued on antibiotics to cover for recent Pseudomonas bacteremia without significant fevers or chills.   Cardiology saw the patient and they recommended further evaluation as an outpatient with consideration of pacemaker placement. Discussed with family members and patient at length regarding the fact that there is high likelihood that she will have recurrent gallbladder disease, which may require surgical intervention, and it would be extremely difficult to do this unless her heart rates were able to be more stabilized. It appears that likely the only way to achieve this stability is to have a pacemaker placed so that we can continue to use  rate-limiting medications for further tachycardia and they expressed understanding. They further expressed understanding that given the nature of her cardiac symptoms and the gallbladder disease, that there is a high likelihood that she may, in fact, end up returning to the hospital in the near future and there is no way to predict this.   At this time, she is doing well enough that she will return to skilled rehabilitation. Her physical activity will be up with assistance with a walker as tolerated. She will follow a 2 gram sodium, 1800 calorie ADA diet. Sugar should be checked twice a day with physician called for less than 70 or more than 250. She will follow up with Dr. Saralyn Pilar 1 to 2 weeks to evaluate for possible pacemaker placement. Physical therapy and occupational therapy should evaluate and treat the patient. An INR, CBC and Met-B will need to be drawn in 4 days with results to the nursing home physician. She should be weighed daily with physician contacted for more than 2 pounds gain in 1 day or 5 pounds in 1 week or increasing signs or symptoms of heart failure.   DISCHARGE MEDICATIONS:  1.  Omeprazole 20 mg 2 tablets p.o. b.i.d.  2.  Ferrous sulfate 325 mg p.o. daily.  3.  Vitamin B12 250 mcg p.o. daily.  4.  Vitamin D3 1000 units p.o. daily.  5.  Latanoprost 1 drop to each eye at bedtime.  6.  Brimonidine 1 drop to each eye b.i.d.  7.  Losartan 50 mg p.o. daily.  9.  Ofloxacin otic 4 drops in the right ear 3 times a day x 5 days.  10. Levothyroxine 0.1 mg  p.o. daily.  11. Coumadin 4 mg p.o. daily.  12. Glipizide XL 2.5 mg p.o. q.a.m.  13. Nitroglycerin 0.4 mg sublingually as needed for chest pain.  14. Cardizem CD 180 mg p.o. daily.   The patient has completed courses of Levaquin, Flagyl and Lovenox and no longer needs these medications.   ____________________________ Adin Hector, MD [id number] D: 09/21/2013 07:26:10 ET T: 09/21/2013 07:37:08 ET JOB#: [id number]  cc: Adin Hector, MD, <Dictator> Ramonita Lab MD ELECTRONICALLY SIGNED 09/26/2013 8:17

## 2014-09-29 NOTE — Consult Note (Signed)
Chief Complaint:  Subjective/Chief Complaint Pt states to be doing ok . No cp left arm ok Site ok.   VITAL SIGNS/ANCILLARY NOTES: **Vital Signs.:   14-May-15 08:49  Vital Signs Type Pre Medication  Temperature Temperature (F) 98.4  Celsius 36.8  Temperature Source oral  Pulse Pulse 106  Respirations Respirations 16  Systolic BP Systolic BP 921  Diastolic BP (mmHg) Diastolic BP (mmHg) 91  Mean BP 110  Pulse Ox % Pulse Ox % 94  Pulse Ox Activity Level  At rest  Oxygen Delivery Room Air/ 21 %   Brief Assessment:  GEN well developed, well nourished, no acute distress   Cardiac Regular  murmur present  --Gallop   Respiratory normal resp effort  clear BS  rhonchi   Gastrointestinal Normal   Gastrointestinal details normal Soft   EXTR negative cyanosis/clubbing, negative edema   Lab Results: LabObservation:  14-May-15 09:56   OBSERVATION Reason for Test  Routine Chem:  14-May-15 13:44   Result Comment APTT - RESULTS VERIFIED BY REPEAT TESTING.  - NOTIFIED OF CRITICAL VALUE  - READ-BACK PROCESS PERFORMED.  - CALLED TO ABIGAIL JACKSON AT 1941 ON  - 10/19/13 [facility],CONFIRMED BY QAD  Result(s) reported on 19 Oct 2013 at 02:55PM.  Routine Coag:  14-May-15 06:43   Activated PTT (APTT)  36.0 (A HCT value >55% may artifactually increase the APTT. In one study, the increase was an average of 19%. Reference: "Effect on Routine and Special Coagulation Testing Values of Citrate Anticoagulant Adjustment in Patients with High HCT Values." American Journal of Clinical Pathology 2006;126:400-405.)  Prothrombin  16.0  INR 1.3 (INR reference interval applies to patients on anticoagulant therapy. A single INR therapeutic range for coumarins is not optimal for all indications; however, the suggested range for most indications is 2.0 - 3.0. Exceptions to the INR Reference Range may include: Prosthetic heart valves, acute myocardial infarction, prevention of myocardial infarction, and  combinations of aspirin and anticoagulant. The need for a higher or lower target INR must be assessed individually. Reference: The Pharmacology and Management of the Vitamin K  antagonists: the seventh ACCP Conference on Antithrombotic and Thrombolytic Therapy. DEYCX.4481 Sept:126 (3suppl): N9146842. A HCT value >55% may artifactually increase the PT.  In one study,  the increase was an average of 25%. Reference:  "Effect on Routine and Special Coagulation Testing Values of Citrate Anticoagulant Adjustment in Patients with High HCT Values." American Journal of Clinical Pathology 2006;126:400-405.)    13:44   Activated PTT (APTT)  > 160.0 (A HCT value >55% may artifactually increase the APTT. In one study, the increase was an average of 19%. Reference: "Effect on Routine and Special Coagulation Testing Values of Citrate Anticoagulant Adjustment in Patients with High HCT Values." American Journal of Clinical Pathology 2006;126:400-405.)   Radiology Results: XRay:    13-May-15 14:59, Chest Portable Single View  Chest Portable Single View   REASON FOR EXAM:    Pacemaker  COMMENTS:       PROCEDURE: DXR - DXR PORTABLE CHEST SINGLE VIEW  - Oct 18 2013  2:59PM     CLINICAL DATA:  Post op Pacemaker    EXAM:  PORTABLE CHEST - 1 VIEW    COMPARISON:  DG CHEST 1V PORT dated 10/10/2013    FINDINGS:  Low lung volumes. Cardiac silhouette is enlarged. Patient is status  post median sternotomy coronary artery bypass grafting. Persistent  diffuse interstitial prominence with slight decrease conspicuity.  Linear areas of increased density left lung base. No acute  osseous  abnormalities. Stable left chest wall cardiac pacing unit.     IMPRESSION:  Decreased pulmonary edema    Atelectasis versus infiltrate left lung base      Electronically Signed    By: Margaree Mackintosh M.D.    On: 10/18/2013 15:13       Verified By: Mikki Santee, M.D., MD  Cardiology:    12-May-15 18:08, ECG   Ventricular Rate 81  Atrial Rate 81  QRS Duration 86  QT 370  QTc 429  R Axis -30  T Axis 108  ECG interpretation   Atrial fibrillation  Left axis deviation  Minimal voltage criteria for LVH, may be normal variant  Nonspecific T wave abnormality  Abnormal ECG  When compared with ECG of 10-Oct-2013 05:51,  Inverted T waves have replaced nonspecific T wave abnormality in Lateral leads  ----------unconfirmed----------  Confirmed by OVERREAD, NOT (100), editor PEARSON, BARBARA ([id number]) on 10/18/2013 12:51:47 PM  ECG    Assessment/Plan:  Invasive Device Daily Assessment of Necessity:  Does the patient currently have any of the following indwelling devices? none   Assessment/Plan:  Assessment IMP S/P Pacer placement HTN CAD AFIB MVR metal CHF DM SOB .   Plan PLAN POD 1 PPM daul chamber Resume heparin IV for anticoug Coumadin today for long term anticoug Agree with DM control Rate control /Rhythm control with Sotolol HTN control with usual meds Increase activity Antibx post op   Electronic Signatures: Lujean Amel D (MD)  (Signed 14-May-15 17:40)  Authored: Chief Complaint, VITAL SIGNS/ANCILLARY NOTES, Brief Assessment, Lab Results, Radiology Results, Assessment/Plan   Last Updated: 14-May-15 17:40 by Yolonda Kida (MD)

## 2014-09-29 NOTE — H&P (Signed)
Floyd NAME:  [name], [name] MR#:  [id number] DATE OF BIRTH:  12/21/1935  DATE OF ADMISSION:  08/15/2013  PRIMARY CARE PHYSICIAN: Adin Hector, MD   REFERRING PHYSICIAN: Jon Gills. Lord, MD  CHIEF COMPLAINT: Generalized weakness.   HISTORY OF PRESENT ILLNESS: [name] is a 79 year old female with history of diabetes mellitus, on oral medication, hypertension, hyperlipidemia, coronary artery disease, status post coronary artery bypass grafting and mitral valve replacement with a mechanical valve, has been on chronic anticoagulation, who was recently admitted in January 2015 for pneumonia. Since [name] was discharged, [name] has been experiencing generalized weakness which has been gradually getting worse. [name] was recently found to have hemoglobin A1c of 5.5. [name] was taken off of all blood sugar medications. Since then, [name] has been eating regular food. [name] has been experiencing severe generalized weakness, polyuria, polydipsia. Today, [name] was unable to get out of Destiny bed. Concerning this, EMS was called and was brought to [facility]. Workup in [facility]: [name] was noted to have elevated blood sugars of 697, elevated BUN and creatinine from Destiny baseline. [name] denies having any cough, shortness of breath. No fever. Chest x-ray, 1-view, portable, shows lingular density consistent with scarring or subsegmental atelectasis. PT/INR is 27 and 2.6. On EKG, [name] is also found to have T wave inversions in Destiny inferolateral leads, which is a change from Destiny previous EKG. Concerning this, discussed with Dr. Ubaldo Glassing, who recommended [name] to be started on heparin drip; however, [name] does not have any chest pain, only experiences shortness of breath with exertion. [name] has initial set of troponin of 0.08; however, [name] has chronic renal insufficiency. Ordered CK-MB, which is currently  pending.   PAST MEDICAL HISTORY:  1. Atrial fibrillation.  2. Parkinson's disease.  3. Coronary artery disease, status post coronary artery bypass grafting.  4. Mitral valve replacement with a mechanical valve.  5. Diabetes mellitus, has been off of medications.  6. Hypothyroidism.  7. Hypertension.  8. Glaucoma.  9. Gastroesophageal reflex disease.  10. Debility.   PAST SURGICAL HISTORY:  1. Coronary artery bypass grafting.  2. Mitral valve replacement.  3. Partial hysterectomy.  4. Appendectomy.   ALLERGIES: No known drug allergies.   HOME MEDICATIONS:  1. Coumadin 2.5 mg once a day.  2. Vitamin D3 1000 units once a day.  3. Vitamin B12 250 mcg once a day.  4. Sotalol 40 mg 2 times a day.  5. Omeprazole 40 mg 2 times a day.  6. Losartan 50 mg once a day.  7. Levothyroxine 100 mcg once a day.  8. Latanoprost ophthalmic drops. 9. Imdur 40 mg 2 times a day.  10. Hydralazine 50 mg 2 times a day.  11. Lasix 40 mg 1 tablet 2 times a day.  12. Ferrous sulfate 325 mg once a day.  13. Calcium carbonate 600 mg once a day.   SOCIAL HISTORY: Previous history of smoking, quit smoking in 2000. Denies drinking alcohol or using illicit drugs. Married, lives with her husband.   FAMILY HISTORY: Mother died at an early age of unknown cause. Father died of an MI.   REVIEW OF SYSTEMS:  CONSTITUTIONAL: Severe generalized weakness and weight loss.  EYES: Some blurry vision.  ENT: No change in hearing.  RESPIRATORY: No cough, shortness of breath.  CARDIOVASCULAR: No chest pain, palpations.  GASTROINTESTINAL: Has decreased appetite. No nausea, vomiting, abdominal pain.  GENITOURINARY: Somewhat decreased urine output.  ENDOCRINE: History of diabetes mellitus and hypothyroidism.  SKIN: No rash or lesions.  MUSCULOSKELETAL: Has chronic back pain.  NEUROLOGIC: No weakness or numbness in any part of Destiny body.   PHYSICAL EXAMINATION:  GENERAL: This is a well-built, well-nourished female  lying down in Destiny bed, not in distress.  VITAL SIGNS: Temperature 96, pulse 91, blood pressure 86/49, respiratory rate of 16, oxygen saturation 100% on room air.  HEENT: Head: Normocephalic, atraumatic. Eyes: No scleral icterus. Conjunctivae normal. Pupils equal, round and reactive to light. Extraocular movements are intact. Mucous membranes: Mild dryness. No pharyngeal erythema.  NECK: Supple. No lymphadenopathy. No JVD. No carotid bruit.  CHEST: Has no focal tenderness.  LUNGS: Bilaterally clear to auscultation.  HEART: S1, S2 regular. No murmurs are heard. No pedal edema. Pulses 2+.  ABDOMEN: Bowel sounds present. Soft, nontender, nondistended. No hepatosplenomegaly.  EXTREMITIES: No pedal edema.  SKIN: No rash or lesions.  MUSCULOSKELETAL: Good range of motion in all Destiny extremities.  NEUROLOGIC: [name] is alert, oriented to place, person and time. Cranial nerves II through XII intact. Motor 5/5 in upper and lower extremities.   LABORATORY DATA: CMP: Glucose 697, BUN 38, creatinine of 2.38. Destiny rest of all Destiny values are within normal limits. CBC: WBC of 4.5, hemoglobin 11.2, platelet count of 173. Troponin 0.08.  44. Chest x-ray, 1-view, portable: Lingular density consistent with scarring or subsegmental atelectasis. PT 27, INR of 2.6.   ASSESSMENT AND PLAN: [name] is a 79 year old female who comes to [facility] with hyperglycemia.   1. Hyperglycemia. [name] has been taken off of Destiny medications and has been noncompliant with Destiny diet. Continue with IV fluids.  2. Hypotension. This could be a combination of dehydration and medication. Hold all blood pressure medications. Keep [name] on IV fluids.  3. Acute on chronic renal insufficiency, most likely secondary to acute tubular necrosis from dehydration and low blood pressure. Continue with IV fluids and maintain Destiny systolic blood pressure over 100.  4. Electrocardiogram changes with T-wave inversions in Destiny  inferolateral leads. [name]'s initial troponin is 0.08; however, [name] has renal insufficiency. Will follow up with [facility] CK-MB. If elevated, then will start on Destiny heparin drip. Will hold on Destiny heparin drip for now. [name] is asymptomatic.  5. Debility. Will involve Destiny physical therapy, occupational therapy.  6. Mitral valve replacement. [name] has therapeutic INR. Will continue Destiny current regimen.  7. [name] is on therapeutic INR, which should cover for deep vein thrombosis prophylaxis.   TIME SPENT: 60 minutes of critical care time.   ____________________________ Monica Becton, MD pv:lb D: 08/15/2013 02:16:34 ET T: 08/15/2013 06:26:30 ET JOB#: [id number]  cc: Monica Becton, MD, <Dictator> Monica Becton MD ELECTRONICALLY SIGNED 08/24/2013 4:36

## 2014-09-29 NOTE — Consult Note (Signed)
Brief Consult Note: Diagnosis: acute cholecystitis.   Patient was seen by consultant.   Consult note dictated.   Recommend further assessment or treatment.   Orders entered.   Comments: Altered mental ststus and BNP 2800 concerning. Current anticoagulation plan seems acceptible from both a surgical standpoint, and regarding her MVR. Bili 2.6 and SGOT quite high for acute cholecystitis, so we will see how this progresses, before scheduling surgery. Agree with IV ABx regimen.  Electronic Signatures: Consuela Mimes (MD)  (Signed 03-Apr-15 20:47)  Authored: Brief Consult Note   Last Updated: 03-Apr-15 20:47 by Consuela Mimes (MD)

## 2014-09-29 NOTE — Discharge Summary (Signed)
PATIENT NAMERANDYE, [name] MR#:  [id number] DATE OF BIRTH:  06/17/1935  DATE OF ADMISSION:  09/08/2013 DATE OF DISCHARGE: 09/15/2013  FINAL DIAGNOSES:  1.  Acute cholecystitis.  2.  Pseudomonas bacteremia and sepsis secondary to #1.  3.  Metabolic encephalopathy secondary to #1.  4.  Hypertension.  5.  Chronic left-sided diastolic congestive heart failure.  6.  Chronic atrial fibrillation.  7.  History of mechanical heart valve and chronic anticoagulation.  8.  Adult onset diabetes mellitus.  9.  Chronic blood loss anemia; the patient chronically requires iron at home and had been unable to take this during hospitalization.  10. Diarrhea, improved, Clostridium difficile studies negative for Clostridium difficile producing toxin.  11. Parkinsonism.  12. Glaucoma.   HISTORY AND PHYSICAL: Please see dictated admission history and physical.   [place]: The patient was admitted with elevated liver enzymes, abdominal pain, sepsis and evidence of acute cholecystitis. Given her use of Coumadin, however, she was not an immediate surgical candidate. She was placed on a heparin drip, Coumadin was held and surgery was consulted. Blood cultures were obtained, which were positive for Pseudomonas. She was placed on Zosyn.   Over the next several days, she had episodes of bradycardia with heart rate down to 15 to 20 range, and then episodes brief tachycardia. Cardiology was counseled and the patient was taken off sotalol. She had recently been hospitalized for A. fib. with rapid rate, but fortunately, her heart rate was reasonably controlled. Her lower heart rates did improve. It was felt that she may require pacemaker, but was not a candidate for this given recent bacteremia. Her liver enzymes were followed and slowly improved. Surgery followed her and they felt that given her extreme high risk for surgery because of her atrial fibrillation/sick sinus syndrome and improvement in liver  enzymes, they did not wish to take her to the operating room. This was discussed at length with the patient and family members. Her diet was slowly advanced and she tolerated this. She did develop some diarrhea. C. diff. was sent, with C. diff. bacteria present, but was non-toxin producing. ID was consulted and she was to continue on Levaquin for coverage of her Pseudomonas bacteremia and Flagyl was added for the diarrhea. This appeared to improve her situation significantly and she will complete a course of antibiotics for these.   She had progressive anemia, which has been a problem for the patient in the past when she is not able to oral iron; she received 1 unit transfusion. Her blood counts did improve with this. She did feel better and she will be resumed on iron, though will have to watch to see whether the anemia worsens.   She developed some bleeding from her right ear. She was evaluated by ENT and was found to have some excoriation and was placed on ofloxacin otic drops for this with improvement. Physical therapy worked with the patient and she is only able to go about 20 feet. It was felt that she would benefit from a skilled rehab to improve her balance, endurance, as well as to monitor her blood levels and INR. Family members were in agreement and a bed search began. A bed became available and she will be discharged to that facility in stable condition with her physical activity up with a walker as tolerated. She will follow a 2 gram sodium, 1800 calorie, ADA diet. Sugars should be checked twice daily with physician called for sugars less than  70 or greater than 200. She should be weighed daily with physician to contact with more than 2 pound gain in 1 day or 5 pounds in 1 week or any increased signs or symptoms of heart failure. She will follow up with Dr. Tami Ribas in 1 to 2 weeks and will have her follow up with Dr. Clayborn Bigness in 3 to 4 weeks regarding her heart rate.   Physical therapy should  evaluate and treat the patient. She will need INR, CBC, and Met-B in 2 days with results to the nursing home physician.   DISCHARGE MEDICATIONS:  1.  Omeprazole 20 mg 2 tablets p.o. b.i.d.  2.  Ferrous sulfate 325 mg p.o. daily.  3.  Vitamin B12 250 mcg p.o. daily.  4.  Vitamin D 1000 units p.o. daily.  5.  Latanoprost 1 drop to each eye once a day.  6.  Brimonidine 1 drop to each eye b.i.d.  7.  Coumadin 3 mg p.o. daily.  8.  Glipizide XL 5 mg p.o. q.a.m.  9.  Levothyroxine 0.1 mg p.o. daily for hypothyroidism.  10. Losartan 15 mg p.o. daily.  11. Lovenox 60 mg subcutaneous b.i.d., may be discontinued when INR greater than 2.5.  12. Flagyl 500 mg p.o. b.i.d. x 7 days.  13. Ofloxacin otic 4 drops in the right ear 3 times a day x 5 days.  14. Levofloxacin 750 mg p.o. every 48 hours x 8 days.   At this time, she will hold the calcium, potassium, and sotalol. The calcium may be restarted when she has finished the levofloxacin.   ____________________________ Adin Hector, MD bjk:aw D: 09/15/2013 08:39:09 ET T: 09/15/2013 08:54:45 ET JOB#: [id number]  cc: Adin Hector, MD, <Dictator> [facility] MD ELECTRONICALLY SIGNED 09/26/2013 8:16

## 2014-09-29 NOTE — H&P (Signed)
PATIENT NAME:  [name], [name] MR#:  [id number] DATE OF BIRTH:  1936-02-06  DATE OF ADMISSION:  09/08/2013  PRIMARY CARE PHYSICIAN: Dr. Ramonita Lab.   CHIEF COMPLAINT: Abdominal pain, nausea and vomiting.   HISTORY OF PRESENT ILLNESS: This is a 79 year old female who presents to the hospital due to abdominal pain, nausea, vomiting and altered mental status. The patient was just discharged from the hospital yesterday after being treated for new onset atrial fibrillation. The patient was started on rate controlling meds and discharged on Lovenox and Coumadin. The patient's husband said that they ate supper last night and she was doing fine, but she woke up this morning having some belly pain and had some nausea and vomiting. She was constipated. The husband gave her some prune juice, although she vomited her prune juice up along with her supper from last night. The patient then also started to get more confused and therefore he brought her to the ER for further evaluation. When the patient presented to the Emergency Room, the patient was noted to have abnormal LFTs. She underwent abdominal ultrasound, which showed findings suggestive of acute cholecystitis. The patient still  complains of mild abdominal pain and has not had any further nausea and vomiting while being in the Emergency Room.   REVIEW OF SYSTEMS: CONSTITUTIONAL: No documented fever. No weight gain. No weight loss.  EYES: No blurred or double vision.  ENT: No tinnitus. No postnasal drip. No redness of the oropharynx.  RESPIRATORY: No cough. No wheeze. No hemoptysis. No dyspnea.  CARDIOVASCULAR: No chest pain. No orthopnea. No palpitations or syncope.  GASTROINTESTINAL: Positive nausea. Positive vomiting. Positive abdominal pain. No melena or hematochezia.  GENITOURINARY: No dysuria or hematuria.  ENDOCRINE: No polyuria or nocturia. No heat or cold intolerance.    HEMATOLOGIC: No anemia. No bruising. No bleeding.  INTEGUMENTARY: No  rashes. No lesions.  MUSCULOSKELETAL: No arthritis. No swelling. No gout.  NEUROLOGIC: No numbness, tingling. No ataxia. No seizure-type activity.  PSYCHIATRIC:  No anxiety. No insomnia. No ADD.   PAST MEDICAL HISTORY: Consistent with rheumatic heart disease, status post mitral valve replacement, which is a metallic valve; history of coronary artery disease, status post bypass; atrial fibrillation, diabetes, hypertension, hypothyroidism, GERD, glaucoma.   ALLERGIES: No known drug allergies.   SOCIAL HISTORY: No smoking. No alcohol abuse. No illicit drug abuse. Lives at home with her husband.   FAMILY HISTORY: Both mother and father are deceased. Father had heart disease and diabetes. She cannot recall what her mother died from.   CURRENT MEDICATIONS: Are as follows: Brimonidine ophthalmic solution 0.15% b.i.d., calcium carbonate 600 mg daily, Lovenox 60 mg subcutaneous b.i.d., iron sulfate 325 mg daily, glipizide 5 mg daily, latanoprost eye drops 0.005% at bedtime, Synthroid 100 mcg daily, losartan 50 mg daily, omeprazole 20 mg daily, potassium 10 mEq daily, sotalol 80 mg 1/2 tab b.i.d., vitamin B12 250 mcg daily, vitamin D3 1000 International Units daily and warfarin 3 mg daily.   PHYSICAL EXAMINATION: Presently is as follows:  VITAL SIGNS:  Noted to be, temperature is 98.9, pulse 58, respirations 16, blood pressure 140/68, sats 95% on room air.  GENERAL: She is a pleasant-appearing female. No apparent distress.  HEENT:  Atraumatic, normocephalic. Extraocular muscles are intact. Pupils equal and reactive got to light. Sclerae anicteric. No conjunctival injection. No pharyngeal erythema.  NECK: Supple. There is no jugular venous distention. No bruits. No lymphadenopathy or thyromegaly.  HEART: Irregular. She does have a 2/6 diastolic murmur, also metallic valve  click auscultated. No rubs.  LUNGS: Clear to auscultation bilaterally. No rales. No rhonchi. No wheezes.  ABDOMEN: Soft, flat,  nontender, nondistended. Has good bowel sounds. No hepatosplenomegaly appreciated.  EXTREMITIES: No evidence of any cyanosis, clubbing or peripheral edema. Has +2 pedal and radial pulses bilaterally.  NEUROLOGIC: The patient is alert, awake and oriented x 1. Globally weak. Difficult to do a full neurological exam, but moves all extremities spontaneously. No focal motor or sensory deficits appreciated bilaterally.  SKIN: Moist and warm with no rashes appreciated.  LYMPHATIC: There is no cervical or axillary lymphadenopathy.   LABORATORY DATA: Showed a serum glucose of 234, BUN 20, creatinine 1.2, sodium 137, potassium 4.0, chloride 104, bicarb 26. Total bili is 2.6, alk phos 164, AST 871, ALT 459, troponin less than 0.02. White cell count 5.3, hemoglobin 11.1, hematocrit 33.6, platelet count of 190. INR is 1.8. Urinalysis within normal limits.   The patient did undergo a CT of the head done without contrast, which showed multiple prior infarcts. No acute infarct noted.  No mass  hemorrhage noted. The patient also had a chest x-ray done, which showed no acute abnormality. An abdominal ultrasound, limited, showing dilated gallbladder with gallstones and thickened, edematous wall and pericholecystic fluid consistent with acute cholecystitis.   ASSESSMENT AND PLAN: This is a 79 year old female with history of rheumatic heart disease, status post mitral lab placement, coronary artery disease, status post bypass; chronic atrial fibrillation, diabetes, hypertension, hypothyroidism, gastroesophageal reflux disease glaucoma, who was just discharged from the hospital yesterday, returns back due to abdominal pain, nausea, vomiting and altered mental status and noted to have abnormal LFTs and acute cholecystitis.  1.  Altered mental status. This is likely metabolic encephalopathy from acute cholecystitis. The patient has no focal neurologic source. Her CT head is negative. I will empirically treat her with IV Zosyn  for the underlying cholecystitis. She will likely need a cholecystectomy in the near future. I will follow mental status closely.  2.  Acute cholecystitis, likely cause of the patient's abdominal pain, nausea, vomiting and abnormal LFTs. Place her on empiric Zosyn. Follow blood cultures. I will place a surgical consult as the patient will likely need a cholecystectomy in the near future.  3.  History of rheumatic heart disease with mitral valve replacement. This is a metallic valve. The patient is on Coumadin. I will hold Coumadin for now; also, her Lovenox. She will likely need to be bridged with a heparin drip, which I will start, and this likely can be stopped a few hours prior to her going to the operating room.  4.  History of chronic atrial fibrillation. This was recently diagnosed. I will continue her rate controlling meds with sotalol, hold her Coumadin and Lovenox, place her on a heparin nomogram for now.  5.  Gastroesophageal reflux disease. Continue IV Protonix.  6.  Hypothyroidism. Continue Synthroid.  7.  Glaucoma. Continue brimonidine and latanoprost eye drops.   The patient is a FULL CODE.   Time spent on the admission is 50 minutes.    ____________________________ Belia Heman. Verdell Carmine, MD vjs:dmm D: 09/08/2013 18:37:39 ET T: 09/08/2013 19:48:28 ET JOB#: [id number]  cc: Belia Heman. Verdell Carmine, MD, <Dictator> Henreitta Leber MD ELECTRONICALLY SIGNED 09/17/2013 18:49

## 2014-09-29 NOTE — Consult Note (Signed)
Chief Complaint:  Subjective/Chief Complaint Lethargic and sleeping a lot but states to feel ok. No pain no f/c/s.   VITAL SIGNS/ANCILLARY NOTES: **Vital Signs.:   16-May-15 07:46  Vital Signs Type Routine  Temperature Temperature (F) 98.2  Celsius 36.7  Temperature Source oral  Pulse Pulse 71  Respirations Respirations 17  Systolic BP Systolic BP 295  Diastolic BP (mmHg) Diastolic BP (mmHg) 79  Mean BP 100  Pulse Ox % Pulse Ox % 94  Pulse Ox Activity Level  At rest  Oxygen Delivery Room Air/ 21 %  *Intake and Output.:   Daily 16-May-15 07:00  Grand Totals Intake:  292.6 Output:  1250    Net:  -957.4 24 Hr.:  -957.4  Oral Intake      In:  0  IV (Primary)      In:  292.6  Urine ml     Out:  1250  Length of Stay Totals Intake:  2623 Output:  3960    Net:  -6213   Brief Assessment:  GEN well developed, well nourished, no acute distress   Cardiac Regular  murmur present  --Gallop  clicks from MVR   Respiratory normal resp effort  clear BS  rhonchi   Gastrointestinal Normal   Gastrointestinal details normal Soft   EXTR negative cyanosis/clubbing, negative edema   Lab Results: LabObservation:  14-May-15 09:43   OBSERVATION Reason for Test  Cardiology:  14-May-15 09:43   Echo Doppler REASON FOR EXAM:     COMMENTS:     PROCEDURE: [facility] - ECHO DOPPLER COMPLETE(TRANSTHOR)  - Oct 19 2013  9:43AM   RESULT: Echocardiogram Report  Patient Name:   ETOLA MULL [facility] Date of Exam: 10/19/2013 Medical Rec #:  [id number]  Custom1: Date of Birth:  03-15-1936                Height:       63.0 in Patient Age:    48 years                Weight:       123.5 lb Patient Gender: F                       BSA:          1.58 m??  Indications: Pericardial Effusion Sonographer:    Sherrie Sport RDCS Referring Phys: Lujean Amel, D  Summary:  1. Left ventricular ejection fraction, by visual estimation, is 30 to  35%.  2. Moderately to severely decreased global left ventricular  systolic  function.  3. Mildly increased left ventricular septal thickness.  4. Mildly dilated left atrium.  5. Mildly dilated right atrium.  6. Mild to moderate mitral valve regurgitation.  7. Mild to moderate aortic valve sclerosis/calcification without any  evidence of aortic stenosis.  8. Mild to moderate tricuspid regurgitation.  9. Moderately increased left ventricular posterior wall thickness. 10. Inferior posterior severe hypo/Moderate global hypo. 54. Well seated metallic prosthetic valve. 12. Thickened tricuspid valve. 2D AND M-MODE MEASUREMENTS (normal ranges within parentheses): Left Ventricle:          Normal IVSd (2D):      1.49 cm (0.7-1.1) LVPWd (2D):     1.68 cm (0.7-1.1) Aorta/LA:                  Normal LVIDd (2D):     3.93 cm (3.4-5.7) Aortic Root (2D): 3.00 cm (2.4-3.7) LVIDs (2D):     3.55 cm  Left Atrium (2D): 5.00 cm (1.9-4.0) LV FS (2D):      9.7 %   (>25%) LV EF (2D):     21.6 %   (>50%)                                   Right Ventricle:                                   RVd (2D):        6.83 cm LV DIASTOLIC FUNCTION: MV Peak E: 1.34 m/s E/e' Ratio: 24.50                     Decel Time: 151 msec SPECTRAL DOPPLER ANALYSIS (where applicable): Mitral Valve: MV Max Vel:   1.75 m/s MV P1/2 Time: 43.79 msec MV Mean Grad: 3.0 mmHg MV Area, PHT: 5.02 cm?? Aortic Valve: AoV Max Vel: 1.07 m/s AoV Peak PG: 4.6 mmHg AoV Mean PG:  3.0 mmHg LVOT Vmax: 0.40 m/s LVOT VTI: 0.082 m LVOT Diameter: 2.00 cm AoV Area, Vmax: 1.18 cm?? AoV Area, VTI: 1.25 cm?? AoV Area, Vmn: 1.24 cm?? Tricuspid Valve and PA/RV Systolic Pressure: TR Max Velocity: 2.70 m/s RA  Pressure: 5 mmHg RVSP/PASP: 34.2 mmHg Pulmonic Valve: PV Max Velocity: 1.08 m/s PV Max PG: 4.7 mmHg PV Mean PG:  PHYSICIAN INTERPRETATION: Left Ventricle: The left ventricular internal cavity size was normal. LV  septal wall thickness was mildly increased. LV posterior wall thickness  was moderately increased.  Global LV systolic function was moderately to  severely decreased. Left ventricular ejection fraction, by visual  estimation, is 30 to 35%. Right Ventricle: The right ventricular size is mildly enlarged. Global RV  systolic function is low normal. Left Atrium: The left atrium is mildly dilated. Right Atrium: The right atrium is mildly dilated. Pericardium: There is no evidence of pericardial effusion. Mitral Valve: Mild to moderate mitral valve regurgitation is seen. Well  seated metallic prosthetic valve. Tricuspid Valve: The tricuspid valve is thickened. Mild to moderate  tricuspid regurgitation is visualized. The tricuspid regurgitant velocity  is 2.70 m/s, and with an assumed right atrial pressure of 5 mmHg, the   estimated right ventricular systolic pressure is normal at 34.2 mmHg. Aortic Valve: The aortic valve is normal. Mild to moderate aortic valve  sclerosis/calcification is present, without any evidence of aortic  stenosis. No evidence of aortic valve regurgitation is seen. Pulmonic Valve: The pulmonic valve is normal.  [place] MD Electronicallysigned by [place] MD Signature Date/Time: 10/19/2013/9:04:28 PM  *** Final ***  IMPRESSION: .   Verified By: Yolonda Kida, M.D., MD  Routine Chem:  14-May-15 13:44   Result Comment APTT - RESULTS VERIFIED BY REPEAT TESTING.  - NOTIFIED OF CRITICAL VALUE  - READ-BACK PROCESS PERFORMED.  - CALLED TO ABIGAIL JACKSON AT 7290 ON  - 10/19/13 [facility],CONFIRMED BY QAD  Result(s) reported on 19 Oct 2013 at 02:55PM.  16-May-15 03:59   Result Comment APTT - RESULTS VERIFIED BY REPEAT TESTING.  - NOTIFIED OF CRITICAL VALUE  - Randa Lynn AT 2111 10/21/13.PMH  - READ-BACK PROCESS PERFORMED.  Result(s) reported on 21 Oct 2013 at 04:47AM.  Routine Coag:  14-May-15 06:43   Prothrombin  16.0  INR 1.3 (INR reference interval applies to patients on anticoagulant therapy. A single INR therapeutic range  for  coumarins is not optimal for all indications; however, the suggested range for most indications is 2.0 - 3.0. Exceptions to the INR Reference Range may include: Prosthetic heart valves, acute myocardial infarction, prevention of myocardial infarction, and combinations of aspirin and anticoagulant. The need for a higher or lower target INR must be assessed individually. Reference: The Pharmacology and Management of the Vitamin K  antagonists: the seventh ACCP Conference on Antithrombotic and Thrombolytic Therapy. MEQAS.3419 Sept:126 (3suppl): N9146842. A HCT value >55% may artifactually increase the PT.  In one study,  the increase was an average of 25%. Reference:  "Effect on Routine and Special Coagulation Testing Values of Citrate Anticoagulant Adjustment in Patients with High HCT Values." American Journal of Clinical Pathology 2006;126:400-405.)  Activated PTT (APTT)  36.0 (A HCT value >55% may artifactually increase the APTT. In one study, the increase was an average of 19%. Reference: "Effect on Routine and Special Coagulation Testing Values of Citrate Anticoagulant Adjustment in Patients with High HCT Values." American Journal of Clinical Pathology 2006;126:400-405.)    13:44   Activated PTT (APTT)  > 160.0 (A HCT value >55% may artifactually increase the APTT. In one study, the increase was an average of 19%. Reference: "Effect on Routine and Special Coagulation Testing Values of Citrate Anticoagulant Adjustment in Patients with High HCT Values." American Journal of Clinical Pathology 6222;979:892-119.)    21:48   Activated PTT (APTT)  70.6 (A HCT value >55% may artifactually increase the APTT. In one study, the increase was an average of 19%. Reference: "Effect on Routine and Special Coagulation Testing Values of Citrate Anticoagulant Adjustment in Patients with High HCT Values." American Journal of Clinical Pathology 2006;126:400-405.)  15-May-15 04:06   Prothrombin  15.3   INR 1.2 (INR reference interval applies to patients on anticoagulant therapy. A single INR therapeutic range for coumarins is not optimal for all indications; however, the suggested range for most indications is 2.0 - 3.0. Exceptions to the INR Reference Range may include: Prosthetic heart valves, acute myocardial infarction, prevention of myocardial infarction, and combinations of aspirin and anticoagulant. The need for a higher or lower target INR must be assessed individually. Reference: The Pharmacology and Management of the Vitamin K  antagonists: the seventh ACCP Conference on Antithrombotic and Thrombolytic Therapy. ERDEY.8144 Sept:126 (3suppl): N9146842. A HCT value >55% may artifactually increase the PT.  In one study,  the increase was an average of 25%. Reference:  "Effect on Routine and Special Coagulation Testing Values of Citrate Anticoagulant Adjustment in Patients with High HCT Values." American Journal of Clinical Pathology 2006;126:400-405.)  Activated PTT (APTT)  70.2 (A HCT value >55% may artifactually increase the APTT. In one study, the increase was an average of 19%. Reference: "Effect on Routine and Special Coagulation Testing Values of Citrate Anticoagulant Adjustment in Patients with High HCT Values." American Journal of Clinical Pathology 2006;126:400-405.)  16-May-15 03:59   Prothrombin  15.2  INR 1.2 (INR reference interval applies to patients on anticoagulant therapy. A single INR therapeutic range for coumarins is not optimal for all indications; however, the suggested range for most indications is 2.0 - 3.0. Exceptions to the INR Reference Range may include: Prosthetic heart valves, acute myocardial infarction, prevention of myocardial infarction, and combinations of aspirin and anticoagulant. The need for a higher or lower target INR must be assessed individually. Reference: The Pharmacology and Management of the Vitamin K  antagonists: the seventh  ACCP Conference on Antithrombotic and Thrombolytic Therapy. YJEHU.3149 Sept:126 (3suppl): N9146842. A HCT  value >55% may artifactually increase the PT.  In one study,  the increase was an average of 25%. Reference:  "Effect on Routine and Special Coagulation Testing Values of Citrate Anticoagulant Adjustment in Patients with High HCT Values." American Journal of Clinical Pathology 2006;126:400-405.)  Activated PTT (APTT)  > 160.0 (A HCT value >55% may artifactually increase the APTT. In one study, the increase was an average of 19%. Reference: "Effect on Routine and Special Coagulation Testing Values of Citrate Anticoagulant Adjustment in Patients with High HCT Values." American Journal of Clinical Pathology 2006;126:400-405.)  Routine Hem:  16-May-15 03:59   Platelet Count (CBC) 255 (Result(s) reported on 21 Oct 2013 at 04:29AM.)  Hemoglobin (CBC)  10.2 (Result(s) reported on 21 Oct 2013 at 04:29AM.)   Radiology Results: XRay:    13-May-15 14:59, Chest Portable Single View  Chest Portable Single View   REASON FOR EXAM:    Pacemaker  COMMENTS:       PROCEDURE: DXR - DXR PORTABLE CHEST SINGLE VIEW  - Oct 18 2013  2:59PM     CLINICAL DATA:  Post op Pacemaker    EXAM:  PORTABLE CHEST - 1 VIEW    COMPARISON:  DG CHEST 1V PORT dated 10/10/2013    FINDINGS:  Low lung volumes. Cardiac silhouette is enlarged. Patient is status  post median sternotomy coronary artery bypass grafting. Persistent  diffuse interstitial prominence with slight decrease conspicuity.  Linear areas of increased density left lung base. No acute osseous  abnormalities. Stable left chest wall cardiac pacing unit.     IMPRESSION:  Decreased pulmonary edema    Atelectasis versus infiltrate left lung base      Electronically Signed    By: Margaree Mackintosh M.D.    On: 10/18/2013 15:13       Verified By: Mikki Santee, M.D., MD  Cardiology:    12-May-15 18:08, ECG  Ventricular Rate 81  Atrial Rate 81   QRS Duration 86  QT 370  QTc 429  R Axis -30  T Axis 108  ECG interpretation   Atrial fibrillation  Left axis deviation  Minimal voltage criteria for LVH, may be normal variant  Nonspecific T wave abnormality  Abnormal ECG  When compared with ECG of 10-Oct-2013 05:51,  Inverted T waves have replaced nonspecific T wave abnormality in Lateral leads  ----------unconfirmed----------  Confirmed by OVERREAD, NOT ([id number]), editor PEARSON, BARBARA ([id number]) on 10/18/2013 12:51:47 PM  ECG     14-May-15 09:43, Echo Doppler  Echo Doppler   REASON FOR EXAM:      COMMENTS:       PROCEDURE: [facility] - ECHO DOPPLER COMPLETE(TRANSTHOR)  - Oct 19 2013  9:43AM     RESULT: Echocardiogram Report    Patient Name:   KRISANDRA BUENO [facility] Date of Exam: 10/19/2013  Medical Rec #:  [id number]  Custom1:  Date of Birth:  15-Nov-1935                Height:       63.0 in  Patient Age:    82 years                Weight:       123.5 lb  Patient Gender: F                       BSA:          1.58 m??    Indications: Pericardial Effusion  Sonographer:  Sherrie Sport RDCS  Referring Phys: Lujean Amel, D    Summary:   1. Left ventricular ejection fraction, by visual estimation, is 30 to   35%.   2. Moderately to severely decreased global left ventricular systolic   function.   3. Mildly increased left ventricular septal thickness.   4. Mildly dilated left atrium.   5. Mildly dilated right atrium.   6. Mild to moderate mitral valve regurgitation.   7. Mild to moderate aortic valve sclerosis/calcification without any   evidence of aortic stenosis.   8. Mild to moderate tricuspid regurgitation.   9. Moderately increased left ventricular posterior wall thickness.  10. Inferior posterior severe hypo/Moderate global hypo.  85. Well seated metallic prosthetic valve.  12. Thickened tricuspid valve.  2D AND M-MODE MEASUREMENTS (normal ranges within parentheses):  Left Ventricle:          Normal  IVSd (2D):      1.49 cm  (0.7-1.1)  LVPWd (2D):     1.68 cm (0.7-1.1) Aorta/LA:                  Normal  LVIDd (2D):     3.93 cm (3.4-5.7) Aortic Root (2D): 3.00 cm (2.4-3.7)  LVIDs (2D):     3.55 cm           Left Atrium (2D): 5.00 cm (1.9-4.0)  LV FS (2D):      9.7 %   (>25%)  LV EF (2D):     21.6 %   (>50%)                                    Right Ventricle:                                    RVd (2D):        1.65 cm  LV DIASTOLIC FUNCTION:  MV Peak E: 1.34 m/s E/e' Ratio: 24.50                      Decel Time: 151 msec  SPECTRAL DOPPLER ANALYSIS (where applicable):  Mitral Valve:  MV Max Vel:   1.75 m/s MV P1/2 Time: 43.79 msec  MV Mean Grad: 3.0 mmHg MV Area, PHT: 5.02 cm??  Aortic Valve: AoV Max Vel: 1.07 m/s AoV Peak PG: 4.6 mmHg AoV Mean PG:   3.0 mmHg  LVOT Vmax: 0.40 m/s LVOT VTI: 0.082 m LVOT Diameter: 2.00 cm  AoV Area, Vmax: 1.18 cm?? AoV Area, VTI: 1.25 cm?? AoV Area, Vmn: 1.24 cm??  Tricuspid Valve and PA/RV Systolic Pressure: TR Max Velocity: 2.70 m/s RA   Pressure: 5 mmHg RVSP/PASP: 34.2 mmHg  Pulmonic Valve:  PV Max Velocity: 1.08 m/s PV Max PG: 4.7 mmHg PV Mean PG:    PHYSICIAN INTERPRETATION:  Left Ventricle: The left ventricular internal cavity size was normal. LV   septal wall thickness was mildly increased. LV posterior wall thickness   was moderately increased. Global LV systolic function was moderately to   severely decreased. Left ventricular ejection fraction, by visual   estimation, is 30 to 35%.  Right Ventricle: The right ventricular size is mildly enlarged. Global RV   systolic function is low normal.  Left Atrium: The left atrium is mildly dilated.  Right Atrium: The right atrium is mildly dilated.  Pericardium: There  is no evidence of pericardial effusion.  Mitral Valve: Mild to moderate mitral valve regurgitation is seen. Well   seated metallic prosthetic valve.  Tricuspid Valve: The tricuspid valve is thickened. Mild to moderate   tricuspid regurgitation is visualized.  The tricuspid regurgitant velocity   is 2.70 m/s, and with an assumed right atrial pressure of 5 mmHg, the     estimated right ventricular systolic pressure is normal at 34.2 mmHg.  Aortic Valve: The aortic valve is normal. Mild to moderate aortic valve   sclerosis/calcification is present, without any evidence of aortic   stenosis. No evidence of aortic valve regurgitation is seen.  Pulmonic Valve: The pulmonic valve is normal.    [place] MD  Electronicallysigned by [place] MD  Signature Date/Time: 10/19/2013/9:04:28 PM    *** Final ***    IMPRESSION: .      Verified By: Yolonda Kida, M.D., MD   Assessment/Plan:  Invasive Device Daily Assessment of Necessity:  Does the patient currently have any of the following indwelling devices? none   Assessment/Plan:  Assessment IMP S/P Pacer placement HTN CAD AFIB MVR metal CHF DM SOB .   Plan PLAN POD 3 PPM daul chamber Resume heparin IV for anticoug Continue coumadin today for long term anticoug Agree with DM control Rate control /Rhythm control with Sotolol HTN control with usual meds Increase activity PT/OT Antibx post op Awaiting PT/INRtobe theraputic prior to d/c   Electronic Signatures: Lujean Amel D (MD)  (Signed 16-May-15 11:22)  Authored: Chief Complaint, VITAL SIGNS/ANCILLARY NOTES, Brief Assessment, Lab Results, Radiology Results, Assessment/Plan   Last Updated: 16-May-15 11:22 by Yolonda Kida (MD)

## 2014-10-01 LAB — SURGICAL PATHOLOGY

## 2014-10-07 NOTE — Consult Note (Signed)
PATIENT NAME:  [name], [name] MR#:  [id number] DATE OF BIRTH:  [birth year]  DATE OF CONSULTATION:  06/11/2014  REFERRING PHYSICIAN:  Ramonita Lab, MD CONSULTING PHYSICIAN:  Arther Dames, MD  REASON FOR CONSULTATION: Anemia, possible melena, positive FOBT.  HISTORY OF PRESENT ILLNESS: [name] is a 79 year old female with a past medical history of mechanical valve A-fib, Parkinson's, and coronary artery disease who is presenting to the Emergency Room for evaluation of lightheaded, dizziness, and near syncope. On presenting to the hospital, she was found to have anemia with a hemoglobin of 7.8, which is down significantly from her baseline of mid 10s in May of 2015.   Upon further GI history, [name] has not seen any evidence of blood in her stools. She does report that her stools are always dark in color because she takes iron. She is not sure if they have become more dark recently. She does only take iron once a day. She also is unsure if the frequency has increased. She was taking some cold medicine and thinks in the setting of that that her stool frequency has increased, but she is really not sure. She does have a long-standing history of iron deficiency anemia.   Last endoscopies, based on her chart, were in 2009. At that time, she had a colonoscopy which showed some very small red spots in the right colon that could possibly have represented angiectasias. She also had some mild gastritis on her upper endoscopy.   PAST MEDICAL HISTORY: 1.  Mechanical valve.  2.  A-fib.  3.  Parkinson's. 4.  Anemia.  5.  Hypertension.  6.  Hypothyroidism.  7.  GERD.  8.  Hyperlipidemia.  9.  Diabetes.  10.  Coronary artery disease.   PAST SURGICAL HISTORY:  1.  Mechanical heart valve.  2.  Pacer.  3.  CABG.  4.  Partial hysterectomy.   ALLERGIES: NKDA.   HOME MEDICATIONS: Eyedrops, calcium carbonate, vitamin D, Sinemet, iron 325 daily, furosemide 40 daily, glipizide, hydralazine, isosorbide  dinitrate, levothyroxine 100 mcg daily, losartan, omeprazole 40 mg twice a day, KCL, simvastatin, sotalol 80 mg twice a day, B12, Coumadin 3 mg at bedtime.  SOCIAL HISTORY: She is a former smoker. She does not drink alcohol.   FAMILY HISTORY: She is not aware of any colon cancer or GI malignancy.   REVIEW OF SYSTEMS: A 10 system review was conducted and is negative except as stated in the HPI.   PHYSICAL EXAMINATION:  VITAL SIGNS: Temperature is 97.6, pulse 60, respirations 18, blood pressure 108/62, and pulse ox 96% on room air. GENERAL: Alert and oriented times 4.  No acute distress. Appears stated age. HEENT: Normocephalic/atraumatic. Extraocular movements are intact. Anicteric. NECK: Soft, supple. JVP appears normal. No adenopathy. CHEST: Clear to auscultation. No wheeze or crackle. Respirations unlabored. HEART: Regular. No murmur, rub, or gallop.  Normal S1 and S2. ABDOMEN: Soft, nontender, nondistended.  Normal active bowel sounds in all four quadrants.  No organomegaly. No masses EXTREMITIES: No swelling, well perfused. SKIN: No rash or lesion. Skin color, texture, turgor normal. NEUROLOGICAL: Grossly intact. PSYCHIATRIC: Normal tone and affect. MUSCULOSKELETAL: No joint swelling or erythema.   DIAGNOSTIC DATA: Sodium 145 potassium 3.2, BUN 20, creatinine 0.97, glucose 87. Troponin is normal. White count 2.7, hemoglobin 8.1, hematocrit 25, platelets 186,000, MCV 91. INR 3.1.  She did have a chest x-ray which showed chronic cardiomegaly and pulmonary vascular congestion and chronic lung disease.   ASSESSMENT AND PLAN: Anemia, possible melena: She  does appear to have had a significant drop in her hemoglobin from 6 months ago. Her stool was also heme positive in the Emergency Room. Based on her history it is difficult to say if she has had an increased frequency or increased darkness of her stools. However, with her requiring Coumadin for her mechanical valve in the setting of the  large drop in her hemoglobin and positive fecal occult blood test, it would be warranted to perform further endoscopic work-up at this time since she will need to go back on Coumadin.   RECOMMENDATIONS: 1.  We will plan for EGD and colonoscopy once her INR is less than 2. Would not reverse but would rather let the INR drift down and start Lovenox as needed.  2.  Monitor hemoglobin until stable.  3.  Would perform capsule endoscopy if EGD and colonoscopy are negative.  4.  Further recommendations pending above. ____________________________ Arther Dames, MD mr:sb D: 06/11/2014 13:33:26 ET T: 06/11/2014 13:51:09 ET JOB#: [id number]  cc: Arther Dames, MD, <Dictator> Mellody Life MD ELECTRONICALLY SIGNED 06/20/2014 16:34

## 2014-10-07 NOTE — Consult Note (Signed)
Details:   - GI Note:  EGD:  two adenomatous appearing gastric polyps removed via hot snare. Otherwise normal.  Colon: 4 mm transverse polyp, cold snared. Otherwise normal.   Recs: - capsule endoscopy today - hematology evaluation as outpatient for possible IV iron.   - f/u capsule.   - ok to start coumadin today, lovenox this evening if necessary.   - safe for d/c from GI bleed standpoint   Electronic Signatures: Arther Dames (MD)  (Signed 07-Jan-16 14:21)  Authored: Details   Last Updated: 07-Jan-16 14:21 by Arther Dames (MD)

## 2014-10-07 NOTE — Consult Note (Signed)
Details:   - GI Note:  No bleeding.  INR still 3.  Hgb stable.  No abd pain, n/v, melena.   Exam: vss a and o x 4, nad cta, no w/c nabs, soft,nt,nd  Labs: hgb stable at 7.9 INR 3  A/P: - follow INR - regular diet for dinner then clears for breakfast - hopefully colon/ EGD on Thur, need INR < 2 - OOB, ambulate.   Electronic Signatures: Arther Dames (MD)  (Signed 05-Jan-16 17:58)  Authored: Details   Last Updated: 05-Jan-16 17:58 by Arther Dames (MD)

## 2014-10-07 NOTE — Consult Note (Signed)
Details:   - GI Note:  INR down to 2.0.  a/p: EGD/colon tomorrow +- capsule endo miralax 255 grams tonight and 255 grams at 6 am tomorrow.  - npo after 8 am.   Electronic Signatures: Arther Dames (MD)  (Signed 06-Jan-16 17:57)  Authored: Details   Last Updated: 06-Jan-16 17:57 by Arther Dames (MD)

## 2014-10-07 NOTE — Consult Note (Signed)
Pt breathing OK, her chest exam shows decreased rales in right, still some in lower right, left with rales also.  Due to heart disease have to go slowly when adjusting intravascular volume with diuretics. Denies abd pain, no nausea or vomiting.  Hgb 7.3,  INR 1.5, PT 18 sec. BP 165/65, P 60, T 98.8.  No new recommendations.  Electronic Signatures: Manya Silvas (MD)  (Signed on 10-Jan-16 11:21)  Authored  Last Updated: 10-Jan-16 11:21 by Manya Silvas (MD)

## 2014-10-07 NOTE — Discharge Summary (Signed)
PATIENT NAME:  [name], [name] MR#:  [id number] DATE OF BIRTH:  May 05, 1936  DATE OF ADMISSION:  06/10/2014 DATE OF DISCHARGE:    FINAL DIAGNOSES:  1.  Acute on chronic anemia, likely with gastrointestinal blood loss.  2.  Intravascular hemolysis.  3.  Mechanical heart valve.  4.  Chronic left-sided diastolic congestive heart failure.  5.  Atrial fibrillation.  6.  Hypertension.  7.  Adult onset diabetes mellitus.  8.  Parkinson disease.  9.  Mild dementia.  10.  Chronic bronchitis/chronic obstructive pulmonary disease.  11.  Pancytopenia.   HISTORY AND PHYSICAL: Please see dictated admission history and physical.   HOSPITAL COURSE: The patient was admitted with evidence of anemia which was symptomatic, and has history of GI bleeding. Iron deficiency was also noted. She was evaluated by gastroenterology, taken off of Coumadin, given low-dose vitamin K, and placed on Lovenox bridge. Then when INR was acceptable, the patient underwent colonoscopy and endoscopy. This revealed polyps without source of bleeding. Capsule enteroscopy was attempted, however unfortunately the capsule did not leave the patient's stomach.   She required multiple transfusions, and was noted to have slow drifts down in her hemoglobin after transfusion, again without clear source. Haptoglobin was sent, which was very low, although the patient had been transfused around the time this was performed. Hematology saw the patient in addition for this as well as pancytopenia.   At this point the anticipation is for the patient to followup with hematology, for consideration for repeat transfusion and/or iron infusion left to their discretion, as well as any further workup that needs to be performed. Workup in the hospital was somewhat nonspecific secondary to the fact that the patient had been transfused before workup laboratories had been obtained.   Her level of heart failure appears to be about the same. Her blood pressure  medication was reduced secondary to hypotension thought to be from hypovolemia and this will need to be followed closely. Home health nursing was ordered to follow with this. Her INR is not yet therapeutic, in the range of 2.5-3.5, so she will use Lovenox for now until the INR is high enough. Her husband will assist with this and he has done it before and is quite comfortable doing this.   At this time the patient will be discharged to home in stable condition. Her physical activity will be up with a cane or a walker as tolerated. Home health nursing and physical therapy were ordered for the patient. She should weigh herself daily and call for more than 2 pound gain in one day or 5 pounds in 1 week or any increasing signs or symptoms of heart failure. Her diet should be low salt. She should check sugar daily and record this. She will follow up in our office in about 2 days to recheck her INR and CBC and will follow up with Dr. Grayland Ormond in 1-2 weeks, with decision to follow up with Dr. Rayann Heman to be determined based on progress with the above.   DISCHARGE MEDICATIONS:  1. Ferrous sulfate 325 mg p.o. daily.  2. Vitamin B12, 250 mcg p.o. daily.   3. Vitamin D 1000 units p.o. daily.  4. Latanoprost 1 drop to each eye at bedtime.  5. Brimonidine 0.15% drops to each eye b.i.d.  6. Levothyroxine 0.1 mg p.o. daily.  7. Omeprazole 40 mg p.o. b.i.d.  8. Coumadin 3 mg p.o. daily.  9. Glipizide XL 5 mg p.o. daily.  10. Imdur 30 mg p.o.  b.i.d.  11. Sotalol 80 mg p.o. b.i.d.  12. Calcium 600 mg p.o. daily.  13. Sinemet 25-100, one p.o. t.i.d.  14. Simvastatin 20 mg p.o. at bedtime.  15. Losartan 50 mg p.o. daily.  16. Furosemide 40 mg p.o. daily.  17. Lovenox 60 mg subcutaneous b.i.d. until INR greater than 2.5.  18. Tessalon 100 mg p.o. q. 6 hours as needed for cough.  19. Spiriva 1 capsule inhaled daily.  20. Advair 2 puffs 4 times a day as needed for wheezing.  21. Potassium 10 mEq p.o. daily.   22. Azelastine 1 spray to each nostril b.i.d.  23. Hydralazine will be held at this time, to be resumed if blood pressure gets greater than 511 systolic.    ____________________________ Adin Hector, MD bjk:bu D: 06/19/2014 12:51:00 ET T: 06/19/2014 13:06:23 ET JOB#: [id number]  cc: Adin Hector, MD, <Dictator> Ramonita Lab MD ELECTRONICALLY SIGNED 06/26/2014 13:11

## 2014-10-07 NOTE — Consult Note (Signed)
Pt without new complaints.  Hgb 7.6, hematology wants it above 7.  INR 1.6 PT 18.3.  PE of chest shows insp rales half way up back.  Pt may need touch of lasix.  Will order 20mg  oral now and let primary doctor decide on further dosing in morning.  Electronic Signatures: Manya Silvas (MD)  (Signed on 09-Jan-16 18:49)  Authored  Last Updated: 09-Jan-16 18:49 by Manya Silvas (MD)

## 2014-10-07 NOTE — Consult Note (Signed)
Note Type Consult   Subjective: Chief Complaint/Diagnosis:   Iron deficiency anemia secondary to GI source. Leukopenia. HPI:   Patient is a 79 year old female with multiple medical problems and known chronic GI bleed. She is on Coumadin for a mechanical heart valve as well. Patient was feeling dizzy and was noted having black stools over the past several weeks. She came to the emergency room is found to have a hemoglobin of less than 8. Patient received several units of packed red blood cells and feels improved since admission. She has no neurologic complaints. She denies any fevers. She denies any chest pain or shortness of breath. She denies any further bleeding. She denies any nausea, vomiting, constipation, or diarrhea. She has no urinary complaints. Patient otherwise feels well and offers no further specific complaints.   Review of Systems:  Performance Status (ECOG): 2  Review of Systems:   As per HPI. Otherwise, 10 point system review was negative.   Allergies:  No Known Allergies:   PFSH: Additional Past Medical and Surgical History: mechanical heart valve, atrial fibrillation, Parkinson's disease, glaucoma, hypertension, hypothyroidism, GERD, hyperlipidemia, diabetes, CAD status post CABG, pacemaker, appendectomy, partial hysterectomy.    Social history: Previous heavy tobacco, none since 2000. Denies alcohol.    Family history: CAD.   Home Medications: Medication Instructions Last Modified Date/Time  levothyroxine 100 mcg (0.1 mg) oral tablet 1 tab(s) orally once a day 03-Jan-16 17:21  ferrous sulfate 325 mg oral tablet 1 tab(s) orally once a day 03-Jan-16 17:21  Vitamin B-12 250 mcg oral tablet 1 tab(s) orally once a day 03-Jan-16 17:21  Vitamin D3 1000 intl units oral tablet 1 tab(s) orally once a day 03-Jan-16 17:21  latanoprost ophthalmic 0.005% ophthalmic solution 1 drop(s) to each eye once a day (at bedtime) 03-Jan-16 17:21  brimonidine ophthalmic 0.15% ophthalmic  solution 1 drop(s) to each eye 2 times a day 03-Jan-16 17:21  omeprazole 40 mg oral delayed release capsule 1 cap(s) orally 2 times a day 03-Jan-16 17:21  acetaminophen 325 mg oral tablet 2 tabs (650mg ) orally every 4 hours, As Needed 03-Jan-16 17:21  Coumadin 3 mg oral tablet 1 tab(s) orally once a day (in the evening) 03-Jan-16 17:21  furosemide 40 mg oral tablet 1 tab(s) orally 2 times a day 03-Jan-16 17:21  glipiZIDE extended release 5 mg oral tablet, extended release 1 tab(s) orally once a day 03-Jan-16 17:21  hydrALAZINE 50 mg oral tablet 1 tab(s) orally 2 times a day 03-Jan-16 17:21  isosorbide dinitrate 30 mg oral tablet 1 tab(s) orally 2 times a day 03-Jan-16 17:21  losartan 100 mg oral tablet 1 tab(s) orally once a day 03-Jan-16 17:21  sotalol 80 mg oral tablet 1 tab(s) orally 2 times a day 03-Jan-16 17:21  Calcium 600+D 600 mg-200 units oral tablet 1 tab(s) orally once a day 03-Jan-16 17:21  carbidopa-levodopa 25 mg-100 mg oral tablet 1 tab(s) orally 3 times a day 03-Jan-16 17:21  simvastatin 20 mg oral tablet 1 tab(s) orally once a day (at bedtime) 03-Jan-16 17:21   Vital Signs:  :: vital signs stable, patient afebrile.   Physical Exam:  General: no acute distress, lying in bed.  Mental Status: normal affect  Eyes: anicteric sclera  Head, Ears, Nose,Throat: Normocephalic, moist mucous membranes, clear oropharynx without erythema or thrush.  Neck, Thyroid: No palpable lymphadenopathy, thyroid midline without nodules.  Respiratory: clear to auscultation bilaterally  Cardiovascular: regular rate and rhythm, no murmur, rub, or gallop  Gastrointestinal: soft, nondistended, nontender, no organomegaly.  normal active bowel sounds  Musculoskeletal: No edema  Skin: No rash or petechiae noted  Neurological: alert, answering all questions appropriately.  Cranial nerves grossly intact   Laboratory Results: Routine Micro:  08-Jan-16 05:06   Micro Text Report HIV 1/2 AG AB COMBO   HIV  1/2 ANTIBODIES        NON-REACTIVE ANTIBODY   HIV-1 p24 ANTIGEN         NON-REACTIVE ANTIGEN   INTERPRETATION            NONREACTIVE.  A NONREACTIVE test result means that HIV-1 or HIV-2 antibodies and HIV-1 p24 antigen were not detected in the specimen.   ANTIBIOTIC                       Cardiac:  08-Jan-16 05:06   Troponin I < 0.02 (0.00-0.05 0.05 ng/mL or less: NEGATIVE  Repeat testing in 3-6 hrs  if clinically indicated. >0.05 ng/mL: POTENTIAL  MYOCARDIAL INJURY. Repeat  testing in 3-6 hrs if  clinically indicated. NOTE: An increase or decrease  of 30% or more on serial  testing suggests a  clinically important change)  Routine Sero:  08-Jan-16 05:06   - HIV 1/2 Antibodies NON-REACTIVE ANTIBODY  - HIV-1 p24 Antigen NON-REACTIVE ANTIGEN  - Interpretation NONREACTIVE.  A NONREACTIVE test result means that HIV-1 or HIV-2 antibodies and HIV-1 p24 antigen were not detected in the specimen.  Result(s) reported on 15 Jun 2014 at 08:33AM.  Routine Coag:  08-Jan-16 05:06   Prothrombin  17.7  INR 1.5 (INR reference interval applies to patients on anticoagulant therapy. A single INR therapeutic range for coumarins is not optimal for all indications; however, the suggested range for most indications is 2.0 - 3.0. Exceptions to the INR Reference Range may include: Prosthetic heart valves, acute myocardial infarction, prevention of myocardial infarction, and combinations of aspirin and anticoagulant. The need for a higher or lower target INR must be assessed individually. Reference: The Pharmacology and Management of the Vitamin K  antagonists: the seventh ACCP Conference on Antithrombotic and Thrombolytic Therapy. FYBOF.7510 Sept:126 (3suppl): [id number]. A HCT value >55% may artifactually increase the PT.  In one study,  the increase was an average of 25%. Reference:  "Effect on Routine and Special Coagulation Testing Values of Citrate Anticoagulant Adjustment in Patients with High  HCT Values." American Journal of Clinical Pathology 2006;126:400-405.)  Routine Hem:  08-Jan-16 05:06   Hemoglobin (CBC)  6.6  WBC (CBC)  2.3  RBC (CBC)  2.27  Hematocrit (CBC)  20.6  Platelet Count (CBC) 154  MCV 91  MCH 29.1  MCHC 32.0  RDW  15.3  Neutrophil % 62.4  Lymphocyte % 28.5  Monocyte % 4.7  Eosinophil % 4.1  Basophil % 0.3  Neutrophil # 1.4  Lymphocyte #  0.7  Monocyte #  0.1  Eosinophil # 0.1  Basophil # 0.0 (Result(s) reported on 15 Jun 2014 at 05:36AM.)   Assessment and Plan: Impression:   Iron deficiency anemia secondary to GI bleed, leukopenia. Plan:   1. Anemia: Secondary to GI source. Appreciate GI input, colonoscopy and endoscopy unrevealing. Patient's hemoglobin is currently above 8.0 and she does not require transfusion, continue to monitor closely. Will assess iron stores to see if IV Feraheme is necessary. No further intervention is needed at this time.Leukopenia: Likely multifactorial and secondary to acute disease. No intervention is needed at this time. Will send for peripheral blood flow cytometry for completeness. Further workup can be completed  upon discharge. consult, will follow.  Electronic Signatures: Delight Hoh (MD)  (Signed 08-Jan-16 18:00)  Authored: Note Type, CC/HPI, Review of Systems, ALLERGIES, Patient Family Social History, HOME MEDICATIONS, Vital Signs, Physical Exam, Lab Results Review, Assessment and Plan   Last Updated: 08-Jan-16 18:00 by Delight Hoh (MD)

## 2014-10-07 NOTE — H&P (Signed)
PATIENT NAME:  [name], LECOUNT MR#:  [id number] DATE OF BIRTH:  20-Nov-1935  DATE OF ADMISSION:  06/10/2014  PRIMARY CARE PHYSICIAN: Tama High III, MD  CHIEF COMPLAINT: Weakness.   HISTORY OF PRESENT ILLNESS: This is a 79 year old female with multiple medical problems including metallic heart valve on Coumadin, chronic GI bleed. She has been feeling weak and cannot walk very well. She has been feeling dizzy. She takes iron for the last few years secondary to blood loss. Over the past couple of weeks, she is having black stools. Two weeks ago, she had a cold and [name] prescribed some amoxicillin. She is not having any abdominal pain or any vomiting. In the ER, she was found to have a low hemoglobin of 7.8 and the patient was guaiac-positive black stool. Hospitalist services were contacted for further evaluation. No complaints of chest pain but occasional shortness of breath.   PAST MEDICAL HISTORY: Metallic heart valve, atrial fibrillation, Parkinson, glaucoma, anemia, hypertension, hypothyroidism, gastroesophageal reflux disease, hyperlipidemia, diabetes, coronary artery disease.   PAST SURGICAL HISTORY: Heart valve surgery, pacemaker, CABG, appendix, partial hysterectomy.   ALLERGIES: No known drug allergies.   MEDICATIONS: Include acetaminophen 650 mg every 4 hours as needed for pain, brimonidine 0.15% ophthalmic solution 1 drop both eyes twice a day, calcium carbonate with vitamin D 600 mg/200 international units 1 tablet daily, Sinemet 25/100, 1 tablet 3 times a day, vitamin D 1000 international units daily, ferrous sulfate 325 mg daily, furosemide 40 mg twice a day, glipizide XL 5 mg 1 tablet daily, hydralazine 50 mg twice a day, isosorbide dinitrate 30 mg twice a day, latanoprost 0.005% ophthalmic solution 1 drop both eyes nightly, levothyroxine 1000 mcg daily, losartan 100 mg daily, omeprazole 40 mg twice a day, potassium chloride 10 mEq daily, simvastatin 20 mg at bedtime, sotalol 80  mg twice a day, vitamin B12, 250 mcg daily, Coumadin 3 mg nightly. She was recently put on amoxicillin 500 mg 3 times a day, Tessalon Perles 100 mg 3 times a day.   SOCIAL HISTORY: Quit smoking back in 2000. No alcohol. No drug use. Lives with her husband. Used to work Arboriculturist in the hospital.   FAMILY HISTORY: Father died at 35 of heart disease. Mother died of unknown cause.   REVIEW OF SYSTEMS: Positive for hot feeling, chills. Positive for sweats. Positive for weight loss. Positive for weakness.   EYES: She does wear glasses.   EARS, NOSE, MOUTH AND THROAT: No sore throat. No difficulty swallowing.   CARDIOVASCULAR: Positive for palpitations. No chest pain.   RESPIRATORY: Occasional shortness breath. No cough. No sputum. No hemoptysis.   GASTROINTESTINAL: Positive for nausea. No abdominal pain. Positive for black stools and a couple of days of diarrhea.   GENITOURINARY: No burning on urination or hematuria.   MUSCULOSKELETAL: No joint pain or muscle pain.   INTEGUMENT: No rashes or eruptions.   NEUROLOGIC: No fainting or blackouts.   PSYCHIATRIC: No anxiety or depression.   ENDOCRINE: Positive for hypothyroidism.   HEMATOLOGIC AND LYMPHATIC: Positive for anemia.   PHYSICAL EXAMINATION: VITAL SIGNS: On presentation include: Temperature 97.9, pulse 55, respirations 20, blood pressure 144/57, pulse oximetry 98% on room air.   GENERAL: No respiratory distress.   EYES: Conjunctivae and lids normal. Pupils equal, round, and reactive to light. Extraocular muscles intact. No nystagmus.   EARS, NOSE, MOUTH AND THROAT: Tympanic membranes blocked by wax. Nasal mucosa, no erythema. Throat: No erythema. No exudate seen. Lips and gums: No  lesions.   NECK: No JVD. No bruits. No lymphadenopathy. No thyromegaly. No thyroid nodules palpated.   LUNGS: Clear to auscultation. No use of accessory muscles to breathe. No rhonchi, rales or wheeze heard.   CARDIOVASCULAR: S1, S2  normal. No gallops or rubs heard, 2/6 systolic ejection murmur. Carotid upstroke 2+ bilaterally. No bruits. Pulses 1+ bilaterally. Trace edema of the lower extremity.   ABDOMEN: Soft, nontender. No organomegaly/splenomegaly. Normoactive bowel sounds. No masses felt.   LYMPHATIC: No lymph nodes in the neck.   MUSCULOSKELETAL: Trace edema. No clubbing. No cyanosis.   SKIN: No rashes or ulcers seen.   NEUROLOGIC: Cranial nerves II through XII grossly intact. Deep tendon reflexes 1+ bilateral lower extremities.   PSYCHIATRIC: The patient is oriented to person, place and time.   LABORATORY AND RADIOLOGICAL DATA: Urinalysis negative. White blood cell count 3.1, hemoglobin and hematocrit 7.8 and 44.8. Catheterization up to 27, glucose 112, BUN 20, creatinine 1.21, sodium 141, potassium 3.6, chloride 102, CO2 of 32, calcium 9.1. Troponin negative. INR 3.1. EKG paced, 60 beats per minute, flipped T waves laterally.   ASSESSMENT AND PLAN: 1. Gastrointestinal bleed, likely upper in nature, acute blood loss anemia with melena. Emergency room physician did rectal which was black stool, guaiac-positive. I will hold Coumadin tonight. I will not reverse until gastroenterology sees the patient and decides if they want to do a procedure. Looking back through old procedures, back in 2012, the patient did have colonic angiodysplasia but with the melena, this is probably upper gastroinestinal since the patient is a cardiac patient and symptomatic. I will transfuse 1 unit of blood on a hemoglobin of 7.8. We will give IV Protonix b.i.d.  2. Coagulopathy on Coumadin secondary to metallic valve. I will hold Coumadin tonight, let gastroenterology assess the patient on whether or not they want to do a procedure. The patient will need to be on Coumadin upon discharge and if INR falls below 1.7, may need to start heparin drip if they to do a procedure.  3. Parkinson's disease on Sinemet.  4. Diabetes. Continue glipizide,  sliding scale.  5. Gastroesophageal reflux disease. Put on Protonix IV.  6. Hypothyroidism. On levothyroxine.  7. Hypertension. Blood pressure is stable on usual medications.  8. Glaucoma. Continue eye drops.  9. Hyperlipidemia. Continue simvastatin.   TIME SPENT ON ADMISSION: 55 minutes.    ____________________________ Tana Conch. Leslye Peer, MD rjw:TT D: 06/10/2014 18:53:47 ET T: 06/10/2014 19:10:16 ET JOB#: [id number]  cc: Tana Conch. Leslye Peer, MD, <Dictator> Tama High III, MD Marisue Brooklyn MD ELECTRONICALLY SIGNED 06/20/2014 16:59

## 2014-10-07 NOTE — Consult Note (Signed)
Iron stores adequate and patient with no obvious evidence of hemolysis. White blood cell count is decreased but stable and neutrophil antibodies and flow cytometry are pending. No intervention is needed at this time. Capsule endoscopy is underway. Continue to maintain patient's hemoglobin greater than 7.0. follow.  Electronic Signatures: Delight Hoh (MD)  (Signed on 09-Jan-16 12:22)  Authored  Last Updated: 09-Jan-16 12:22 by Delight Hoh (MD)

## 2014-10-15 ENCOUNTER — Other Ambulatory Visit: Payer: Self-pay | Admitting: Oncology

## 2014-10-15 DIAGNOSIS — C9200 Acute myeloblastic leukemia, not having achieved remission: Secondary | ICD-10-CM

## 2014-10-16 ENCOUNTER — Inpatient Hospital Stay (HOSPITAL_BASED_OUTPATIENT_CLINIC_OR_DEPARTMENT_OTHER): Payer: Medicare Other | Admitting: Oncology

## 2014-10-16 ENCOUNTER — Encounter: Payer: Self-pay | Admitting: Oncology

## 2014-10-16 ENCOUNTER — Inpatient Hospital Stay: Payer: Medicare Other | Attending: Oncology

## 2014-10-16 VITALS — BP 95/66 | HR 116 | Temp 96.8°F | Resp 20 | Wt 115.1 lb

## 2014-10-16 DIAGNOSIS — Z5111 Encounter for antineoplastic chemotherapy: Secondary | ICD-10-CM | POA: Diagnosis present

## 2014-10-16 DIAGNOSIS — C9200 Acute myeloblastic leukemia, not having achieved remission: Secondary | ICD-10-CM | POA: Insufficient documentation

## 2014-10-16 DIAGNOSIS — R5383 Other fatigue: Secondary | ICD-10-CM | POA: Insufficient documentation

## 2014-10-16 DIAGNOSIS — E780 Pure hypercholesterolemia: Secondary | ICD-10-CM

## 2014-10-16 DIAGNOSIS — G20 Parkinson's disease: Secondary | ICD-10-CM | POA: Insufficient documentation

## 2014-10-16 DIAGNOSIS — I4891 Unspecified atrial fibrillation: Secondary | ICD-10-CM | POA: Insufficient documentation

## 2014-10-16 DIAGNOSIS — R5381 Other malaise: Secondary | ICD-10-CM | POA: Insufficient documentation

## 2014-10-16 DIAGNOSIS — E039 Hypothyroidism, unspecified: Secondary | ICD-10-CM

## 2014-10-16 DIAGNOSIS — I10 Essential (primary) hypertension: Secondary | ICD-10-CM | POA: Diagnosis not present

## 2014-10-16 DIAGNOSIS — Z79899 Other long term (current) drug therapy: Secondary | ICD-10-CM | POA: Insufficient documentation

## 2014-10-16 DIAGNOSIS — Z87891 Personal history of nicotine dependence: Secondary | ICD-10-CM | POA: Insufficient documentation

## 2014-10-16 DIAGNOSIS — K219 Gastro-esophageal reflux disease without esophagitis: Secondary | ICD-10-CM | POA: Diagnosis not present

## 2014-10-16 LAB — CBC WITH DIFFERENTIAL/PLATELET
BASOS ABS: 0 10*3/uL (ref 0–0.1)
BASOS PCT: 1 %
Eosinophils Absolute: 0.2 10*3/uL (ref 0–0.7)
Eosinophils Relative: 5 %
HEMATOCRIT: 34.4 % — AB (ref 35.0–47.0)
HEMOGLOBIN: 11.3 g/dL — AB (ref 12.0–16.0)
Lymphocytes Relative: 17 %
Lymphs Abs: 0.9 10*3/uL — ABNORMAL LOW (ref 1.0–3.6)
MCH: 29.6 pg (ref 26.0–34.0)
MCHC: 32.9 g/dL (ref 32.0–36.0)
MCV: 89.9 fL (ref 80.0–100.0)
MONO ABS: 0.3 10*3/uL (ref 0.2–0.9)
Monocytes Relative: 6 %
NEUTROS ABS: 3.5 10*3/uL (ref 1.4–6.5)
NEUTROS PCT: 71 %
PLATELETS: 267 10*3/uL (ref 150–440)
RBC: 3.83 MIL/uL (ref 3.80–5.20)
RDW: 23 % — AB (ref 11.5–14.5)
WBC: 4.9 10*3/uL (ref 3.6–11.0)

## 2014-10-16 LAB — COMPREHENSIVE METABOLIC PANEL
AST: 15 U/L (ref 15–41)
Albumin: 3.5 g/dL (ref 3.5–5.0)
Alkaline Phosphatase: 95 U/L (ref 38–126)
Anion gap: 5 (ref 5–15)
BILIRUBIN TOTAL: 0.5 mg/dL (ref 0.3–1.2)
BUN: 24 mg/dL — ABNORMAL HIGH (ref 6–20)
CALCIUM: 9.7 mg/dL (ref 8.9–10.3)
CHLORIDE: 102 mmol/L (ref 101–111)
CO2: 23 mmol/L (ref 22–32)
Creatinine, Ser: 1.9 mg/dL — ABNORMAL HIGH (ref 0.44–1.00)
GFR calc Af Amer: 28 mL/min — ABNORMAL LOW (ref >60)
GFR calc non Af Amer: 24 mL/min — ABNORMAL LOW (ref >60)
GLUCOSE: 142 mg/dL — AB (ref 65–99)
POTASSIUM: 5.4 mmol/L — AB (ref 3.5–5.1)
SODIUM: 130 mmol/L — AB (ref 135–145)
Total Protein: 7.6 g/dL (ref 6.5–8.1)

## 2014-10-16 LAB — LACTATE DEHYDROGENASE: LDH: 235 U/L — AB (ref 98–192)

## 2014-10-16 NOTE — Progress Notes (Signed)
Patient here today after receiving treatment for AML at [facility]. Patient reports weakness, is currently in rehab at [facility].

## 2014-10-21 ENCOUNTER — Other Ambulatory Visit: Payer: Self-pay | Admitting: Oncology

## 2014-10-21 DIAGNOSIS — C9200 Acute myeloblastic leukemia, not having achieved remission: Secondary | ICD-10-CM | POA: Insufficient documentation

## 2014-10-22 ENCOUNTER — Inpatient Hospital Stay (HOSPITAL_BASED_OUTPATIENT_CLINIC_OR_DEPARTMENT_OTHER): Payer: Medicare Other | Admitting: Oncology

## 2014-10-22 ENCOUNTER — Inpatient Hospital Stay: Payer: Medicare Other

## 2014-10-22 VITALS — BP 124/76 | HR 84 | Temp 95.8°F | Resp 20

## 2014-10-22 VITALS — Ht 61.0 in

## 2014-10-22 DIAGNOSIS — C9200 Acute myeloblastic leukemia, not having achieved remission: Secondary | ICD-10-CM

## 2014-10-22 DIAGNOSIS — K219 Gastro-esophageal reflux disease without esophagitis: Secondary | ICD-10-CM

## 2014-10-22 DIAGNOSIS — R5381 Other malaise: Secondary | ICD-10-CM | POA: Diagnosis not present

## 2014-10-22 DIAGNOSIS — I10 Essential (primary) hypertension: Secondary | ICD-10-CM

## 2014-10-22 DIAGNOSIS — Z5111 Encounter for antineoplastic chemotherapy: Secondary | ICD-10-CM | POA: Diagnosis not present

## 2014-10-22 DIAGNOSIS — I4891 Unspecified atrial fibrillation: Secondary | ICD-10-CM

## 2014-10-22 DIAGNOSIS — E039 Hypothyroidism, unspecified: Secondary | ICD-10-CM

## 2014-10-22 DIAGNOSIS — G20 Parkinson's disease: Secondary | ICD-10-CM

## 2014-10-22 DIAGNOSIS — E780 Pure hypercholesterolemia: Secondary | ICD-10-CM

## 2014-10-22 DIAGNOSIS — Z79899 Other long term (current) drug therapy: Secondary | ICD-10-CM

## 2014-10-22 DIAGNOSIS — R5383 Other fatigue: Secondary | ICD-10-CM

## 2014-10-22 DIAGNOSIS — Z87891 Personal history of nicotine dependence: Secondary | ICD-10-CM

## 2014-10-22 LAB — CBC WITH DIFFERENTIAL/PLATELET
Basophils Absolute: 0 10*3/uL (ref 0–0.1)
Basophils Relative: 1 %
EOS ABS: 0.3 10*3/uL (ref 0–0.7)
EOS PCT: 6 %
HEMATOCRIT: 30.7 % — AB (ref 35.0–47.0)
HEMOGLOBIN: 10 g/dL — AB (ref 12.0–16.0)
LYMPHS ABS: 0.9 10*3/uL — AB (ref 1.0–3.6)
Lymphocytes Relative: 22 %
MCH: 29.8 pg (ref 26.0–34.0)
MCHC: 32.6 g/dL (ref 32.0–36.0)
MCV: 91.2 fL (ref 80.0–100.0)
MONO ABS: 0.3 10*3/uL (ref 0.2–0.9)
MONOS PCT: 6 %
Neutro Abs: 2.8 10*3/uL (ref 1.4–6.5)
Neutrophils Relative %: 65 %
Platelets: 216 10*3/uL (ref 150–440)
RBC: 3.36 MIL/uL — AB (ref 3.80–5.20)
RDW: 23.1 % — ABNORMAL HIGH (ref 11.5–14.5)
WBC: 4.3 10*3/uL (ref 3.6–11.0)

## 2014-10-22 LAB — BASIC METABOLIC PANEL
Anion gap: 5 (ref 5–15)
BUN: 30 mg/dL — ABNORMAL HIGH (ref 6–20)
CHLORIDE: 103 mmol/L (ref 101–111)
CO2: 23 mmol/L (ref 22–32)
Calcium: 9.3 mg/dL (ref 8.9–10.3)
Creatinine, Ser: 1.94 mg/dL — ABNORMAL HIGH (ref 0.44–1.00)
GFR calc Af Amer: 28 mL/min — ABNORMAL LOW (ref >60)
GFR calc non Af Amer: 24 mL/min — ABNORMAL LOW (ref >60)
Glucose, Bld: 100 mg/dL — ABNORMAL HIGH (ref 65–99)
Potassium: 5.5 mmol/L — ABNORMAL HIGH (ref 3.5–5.1)
SODIUM: 131 mmol/L — AB (ref 135–145)

## 2014-10-22 MED ORDER — AZACITIDINE CHEMO SQ INJECTION
75.0000 mg/m2 | Freq: Once | INTRAMUSCULAR | Status: AC
  Administered 2014-10-22: 112.5 mg via SUBCUTANEOUS
  Filled 2014-10-22: qty 4.5

## 2014-10-22 MED ORDER — ONDANSETRON HCL 4 MG PO TABS
8.0000 mg | ORAL_TABLET | Freq: Once | ORAL | Status: AC
  Administered 2014-10-22: 8 mg via ORAL
  Filled 2014-10-22: qty 2

## 2014-10-22 MED ORDER — LIDOCAINE-PRILOCAINE 2.5-2.5 % EX CREA
TOPICAL_CREAM | CUTANEOUS | Status: DC
Start: 2014-10-22 — End: 2014-11-02

## 2014-10-22 MED ORDER — PROCHLORPERAZINE MALEATE 10 MG PO TABS: 10.0000 mg | ORAL_TABLET | Freq: Four times a day (QID) | ORAL | Status: DC | PRN

## 2014-10-23 ENCOUNTER — Inpatient Hospital Stay: Payer: Medicare Other

## 2014-10-23 VITALS — BP 95/58 | HR 104 | Temp 96.0°F | Resp 18

## 2014-10-23 DIAGNOSIS — Z5111 Encounter for antineoplastic chemotherapy: Secondary | ICD-10-CM | POA: Diagnosis not present

## 2014-10-23 DIAGNOSIS — C9200 Acute myeloblastic leukemia, not having achieved remission: Secondary | ICD-10-CM

## 2014-10-23 MED ORDER — ONDANSETRON HCL 4 MG PO TABS
8.0000 mg | ORAL_TABLET | Freq: Once | ORAL | Status: AC
  Administered 2014-10-23: 8 mg via ORAL
  Filled 2014-10-23: qty 2

## 2014-10-23 MED ORDER — AZACITIDINE CHEMO SQ INJECTION
75.0000 mg/m2 | Freq: Once | INTRAMUSCULAR | Status: AC
  Administered 2014-10-23: 112.5 mg via SUBCUTANEOUS
  Filled 2014-10-23: qty 4.5

## 2014-10-24 ENCOUNTER — Inpatient Hospital Stay: Payer: Medicare Other

## 2014-10-24 VITALS — BP 98/68 | HR 89 | Temp 96.0°F | Resp 18

## 2014-10-24 DIAGNOSIS — C9200 Acute myeloblastic leukemia, not having achieved remission: Secondary | ICD-10-CM

## 2014-10-24 DIAGNOSIS — Z5111 Encounter for antineoplastic chemotherapy: Secondary | ICD-10-CM | POA: Diagnosis not present

## 2014-10-24 MED ORDER — ONDANSETRON HCL 4 MG PO TABS
8.0000 mg | ORAL_TABLET | Freq: Once | ORAL | Status: AC
  Administered 2014-10-24: 8 mg via ORAL
  Filled 2014-10-24: qty 2

## 2014-10-24 MED ORDER — AZACITIDINE CHEMO SQ INJECTION
75.0000 mg/m2 | Freq: Once | INTRAMUSCULAR | Status: AC
  Administered 2014-10-24: 112.5 mg via SUBCUTANEOUS
  Filled 2014-10-24: qty 4.5

## 2014-10-25 ENCOUNTER — Inpatient Hospital Stay: Payer: Medicare Other

## 2014-10-25 VITALS — BP 117/75 | HR 81 | Temp 97.0°F | Resp 18

## 2014-10-25 DIAGNOSIS — C9200 Acute myeloblastic leukemia, not having achieved remission: Secondary | ICD-10-CM

## 2014-10-25 DIAGNOSIS — Z5111 Encounter for antineoplastic chemotherapy: Secondary | ICD-10-CM | POA: Diagnosis not present

## 2014-10-25 MED ORDER — AZACITIDINE CHEMO SQ INJECTION
75.0000 mg/m2 | Freq: Once | INTRAMUSCULAR | Status: AC
  Administered 2014-10-25: 112.5 mg via SUBCUTANEOUS
  Filled 2014-10-25: qty 4.5

## 2014-10-25 MED ORDER — ONDANSETRON HCL 4 MG PO TABS
8.0000 mg | ORAL_TABLET | Freq: Once | ORAL | Status: AC
  Administered 2014-10-25: 8 mg via ORAL
  Filled 2014-10-25: qty 2

## 2014-10-26 ENCOUNTER — Inpatient Hospital Stay: Payer: Medicare Other

## 2014-10-26 VITALS — BP 108/75 | HR 94 | Temp 96.3°F | Resp 20

## 2014-10-26 DIAGNOSIS — Z5111 Encounter for antineoplastic chemotherapy: Secondary | ICD-10-CM | POA: Diagnosis not present

## 2014-10-26 DIAGNOSIS — C9200 Acute myeloblastic leukemia, not having achieved remission: Secondary | ICD-10-CM

## 2014-10-26 MED ORDER — ONDANSETRON HCL 4 MG PO TABS
8.0000 mg | ORAL_TABLET | Freq: Once | ORAL | Status: AC
  Administered 2014-10-26: 8 mg via ORAL
  Filled 2014-10-26: qty 2

## 2014-10-26 MED ORDER — AZACITIDINE CHEMO SQ INJECTION
75.0000 mg/m2 | Freq: Once | INTRAMUSCULAR | Status: AC
  Administered 2014-10-26: 112.5 mg via SUBCUTANEOUS
  Filled 2014-10-26: qty 4.5

## 2014-10-29 ENCOUNTER — Inpatient Hospital Stay: Payer: Medicare Other

## 2014-10-29 DIAGNOSIS — C9200 Acute myeloblastic leukemia, not having achieved remission: Secondary | ICD-10-CM

## 2014-10-29 LAB — CBC WITH DIFFERENTIAL/PLATELET
BASOS ABS: 0 10*3/uL (ref 0–0.1)
BASOS PCT: 0 %
EOS ABS: 0.1 10*3/uL (ref 0–0.7)
EOS PCT: 3 %
HCT: 27.3 % — ABNORMAL LOW (ref 35.0–47.0)
Hemoglobin: 8.9 g/dL — ABNORMAL LOW (ref 12.0–16.0)
LYMPHS ABS: 0.7 10*3/uL — AB (ref 1.0–3.6)
LYMPHS PCT: 19 %
MCH: 30.2 pg (ref 26.0–34.0)
MCHC: 32.7 g/dL (ref 32.0–36.0)
MCV: 92.4 fL (ref 80.0–100.0)
MONOS PCT: 3 %
Monocytes Absolute: 0.1 10*3/uL — ABNORMAL LOW (ref 0.2–0.9)
Neutro Abs: 2.8 10*3/uL (ref 1.4–6.5)
Neutrophils Relative %: 75 %
Platelets: 131 10*3/uL — ABNORMAL LOW (ref 150–440)
RBC: 2.96 MIL/uL — ABNORMAL LOW (ref 3.80–5.20)
RDW: 23.1 % — AB (ref 11.5–14.5)
WBC: 3.8 10*3/uL (ref 3.6–11.0)

## 2014-10-29 LAB — COMPREHENSIVE METABOLIC PANEL
ALK PHOS: 84 U/L (ref 38–126)
ALT: <5 U/L — ABNORMAL LOW (ref 14–54)
ANION GAP: 6 (ref 5–15)
AST: 14 U/L — ABNORMAL LOW (ref 15–41)
Albumin: 3.5 g/dL (ref 3.5–5.0)
BUN: 40 mg/dL — ABNORMAL HIGH (ref 6–20)
CALCIUM: 9 mg/dL (ref 8.9–10.3)
CO2: 23 mmol/L (ref 22–32)
Chloride: 101 mmol/L (ref 101–111)
Creatinine, Ser: 2.32 mg/dL — ABNORMAL HIGH (ref 0.44–1.00)
GFR calc non Af Amer: 19 mL/min — ABNORMAL LOW (ref >60)
GFR, EST AFRICAN AMERICAN: 22 mL/min — AB (ref >60)
GLUCOSE: 269 mg/dL — AB (ref 65–99)
POTASSIUM: 3.9 mmol/L (ref 3.5–5.1)
Sodium: 130 mmol/L — ABNORMAL LOW (ref 135–145)
TOTAL PROTEIN: 6.9 g/dL (ref 6.5–8.1)
Total Bilirubin: 0.4 mg/dL (ref 0.3–1.2)

## 2014-10-29 MED ORDER — SODIUM CHLORIDE 0.9 % IV SOLN
INTRAVENOUS | Status: AC
  Administered 2014-10-29: 16:00:00 via INTRAVENOUS
  Filled 2014-10-29: qty 1000

## 2014-10-29 MED ORDER — HEPARIN SOD (PORK) LOCK FLUSH 10 UNIT/ML IV SOLN
10.0000 [IU] | Freq: Once | INTRAVENOUS | Status: AC
  Administered 2014-10-29: 10 [IU]

## 2014-10-30 ENCOUNTER — Other Ambulatory Visit: Payer: Self-pay

## 2014-10-30 ENCOUNTER — Encounter: Payer: Self-pay | Admitting: Emergency Medicine

## 2014-10-30 ENCOUNTER — Inpatient Hospital Stay
Admission: EM | Admit: 2014-10-30 | Discharge: 2014-11-02 | DRG: 871 | Disposition: A | Discharge status: Home / Self Care | Payer: Medicare Other | Attending: Internal Medicine | Admitting: Internal Medicine

## 2014-10-30 ENCOUNTER — Inpatient Hospital Stay: Payer: Medicare Other

## 2014-10-30 ENCOUNTER — Emergency Department: Payer: Medicare Other

## 2014-10-30 VITALS — BP 103/63 | HR 50 | Temp 96.1°F | Resp 20

## 2014-10-30 DIAGNOSIS — D61818 Other pancytopenia: Secondary | ICD-10-CM | POA: Diagnosis not present

## 2014-10-30 DIAGNOSIS — K219 Gastro-esophageal reflux disease without esophagitis: Secondary | ICD-10-CM | POA: Diagnosis present

## 2014-10-30 DIAGNOSIS — R509 Fever, unspecified: Secondary | ICD-10-CM | POA: Diagnosis not present

## 2014-10-30 DIAGNOSIS — D849 Immunodeficiency, unspecified: Secondary | ICD-10-CM

## 2014-10-30 DIAGNOSIS — Z87891 Personal history of nicotine dependence: Secondary | ICD-10-CM

## 2014-10-30 DIAGNOSIS — I482 Chronic atrial fibrillation: Secondary | ICD-10-CM | POA: Diagnosis present

## 2014-10-30 DIAGNOSIS — E119 Type 2 diabetes mellitus without complications: Secondary | ICD-10-CM | POA: Diagnosis present

## 2014-10-30 DIAGNOSIS — C9200 Acute myeloblastic leukemia, not having achieved remission: Secondary | ICD-10-CM

## 2014-10-30 DIAGNOSIS — A419 Sepsis, unspecified organism: Secondary | ICD-10-CM | POA: Diagnosis present

## 2014-10-30 DIAGNOSIS — H409 Unspecified glaucoma: Secondary | ICD-10-CM | POA: Diagnosis present

## 2014-10-30 DIAGNOSIS — Z7189 Other specified counseling: Secondary | ICD-10-CM | POA: Insufficient documentation

## 2014-10-30 DIAGNOSIS — D899 Disorder involving the immune mechanism, unspecified: Secondary | ICD-10-CM

## 2014-10-30 DIAGNOSIS — Z950 Presence of cardiac pacemaker: Secondary | ICD-10-CM

## 2014-10-30 DIAGNOSIS — N183 Chronic kidney disease, stage 3 (moderate): Secondary | ICD-10-CM | POA: Diagnosis present

## 2014-10-30 DIAGNOSIS — I10 Essential (primary) hypertension: Secondary | ICD-10-CM | POA: Diagnosis present

## 2014-10-30 DIAGNOSIS — R531 Weakness: Secondary | ICD-10-CM | POA: Diagnosis not present

## 2014-10-30 DIAGNOSIS — D649 Anemia, unspecified: Secondary | ICD-10-CM

## 2014-10-30 DIAGNOSIS — A4189 Other specified sepsis: Secondary | ICD-10-CM

## 2014-10-30 DIAGNOSIS — Z888 Allergy status to other drugs, medicaments and biological substances status: Secondary | ICD-10-CM | POA: Diagnosis not present

## 2014-10-30 DIAGNOSIS — I129 Hypertensive chronic kidney disease with stage 1 through stage 4 chronic kidney disease, or unspecified chronic kidney disease: Secondary | ICD-10-CM | POA: Diagnosis present

## 2014-10-30 DIAGNOSIS — R42 Dizziness and giddiness: Secondary | ICD-10-CM | POA: Diagnosis not present

## 2014-10-30 DIAGNOSIS — G20 Parkinson's disease: Secondary | ICD-10-CM | POA: Diagnosis present

## 2014-10-30 DIAGNOSIS — Z86718 Personal history of other venous thrombosis and embolism: Secondary | ICD-10-CM | POA: Diagnosis not present

## 2014-10-30 DIAGNOSIS — R6510 Systemic inflammatory response syndrome (SIRS) of non-infectious origin without acute organ dysfunction: Secondary | ICD-10-CM | POA: Diagnosis present

## 2014-10-30 DIAGNOSIS — D61810 Antineoplastic chemotherapy induced pancytopenia: Secondary | ICD-10-CM | POA: Diagnosis present

## 2014-10-30 DIAGNOSIS — T68XXXA Hypothermia, initial encounter: Secondary | ICD-10-CM

## 2014-10-30 DIAGNOSIS — I2510 Atherosclerotic heart disease of native coronary artery without angina pectoris: Secondary | ICD-10-CM | POA: Diagnosis present

## 2014-10-30 DIAGNOSIS — I4891 Unspecified atrial fibrillation: Secondary | ICD-10-CM | POA: Diagnosis present

## 2014-10-30 DIAGNOSIS — Z952 Presence of prosthetic heart valve: Secondary | ICD-10-CM

## 2014-10-30 DIAGNOSIS — T451X5A Adverse effect of antineoplastic and immunosuppressive drugs, initial encounter: Secondary | ICD-10-CM | POA: Diagnosis present

## 2014-10-30 DIAGNOSIS — E039 Hypothyroidism, unspecified: Secondary | ICD-10-CM | POA: Diagnosis present

## 2014-10-30 DIAGNOSIS — I25810 Atherosclerosis of coronary artery bypass graft(s) without angina pectoris: Secondary | ICD-10-CM | POA: Diagnosis present

## 2014-10-30 DIAGNOSIS — D72819 Decreased white blood cell count, unspecified: Secondary | ICD-10-CM

## 2014-10-30 DIAGNOSIS — I509 Heart failure, unspecified: Secondary | ICD-10-CM | POA: Diagnosis present

## 2014-10-30 DIAGNOSIS — Z79899 Other long term (current) drug therapy: Secondary | ICD-10-CM

## 2014-10-30 HISTORY — DX: Leukemia, unspecified not having achieved remission: C95.90

## 2014-10-30 LAB — COMPREHENSIVE METABOLIC PANEL
AST: 13 U/L — ABNORMAL LOW (ref 15–41)
Albumin: 3.5 g/dL (ref 3.5–5.0)
Alkaline Phosphatase: 78 U/L (ref 38–126)
Anion gap: 9 (ref 5–15)
BUN: 32 mg/dL — ABNORMAL HIGH (ref 6–20)
CO2: 26 mmol/L (ref 22–32)
Calcium: 9.3 mg/dL (ref 8.9–10.3)
Chloride: 99 mmol/L — ABNORMAL LOW (ref 101–111)
Creatinine, Ser: 1.75 mg/dL — ABNORMAL HIGH (ref 0.44–1.00)
GFR calc Af Amer: 31 mL/min — ABNORMAL LOW (ref >60)
GFR, EST NON AFRICAN AMERICAN: 27 mL/min — AB (ref >60)
Glucose, Bld: 93 mg/dL (ref 65–99)
POTASSIUM: 3.8 mmol/L (ref 3.5–5.1)
Sodium: 134 mmol/L — ABNORMAL LOW (ref 135–145)
Total Bilirubin: 0.4 mg/dL (ref 0.3–1.2)
Total Protein: 7.1 g/dL (ref 6.5–8.1)

## 2014-10-30 LAB — URINALYSIS COMPLETE WITH MICROSCOPIC (ARMC ONLY)
Bacteria, UA: NONE SEEN
Bilirubin Urine: NEGATIVE
Glucose, UA: NEGATIVE mg/dL
Hgb urine dipstick: NEGATIVE
KETONES UR: NEGATIVE mg/dL
NITRITE: NEGATIVE
Protein, ur: NEGATIVE mg/dL
SPECIFIC GRAVITY, URINE: 1.009 (ref 1.005–1.030)
pH: 5 (ref 5.0–8.0)

## 2014-10-30 LAB — CBC WITH DIFFERENTIAL/PLATELET
Basophils Absolute: 0 10*3/uL (ref 0–0.1)
Basophils Relative: 1 %
EOS ABS: 0.1 10*3/uL (ref 0–0.7)
EOS PCT: 4 %
HCT: 26.5 % — ABNORMAL LOW (ref 35.0–47.0)
Hemoglobin: 8.7 g/dL — ABNORMAL LOW (ref 12.0–16.0)
Lymphocytes Relative: 27 %
Lymphs Abs: 0.8 10*3/uL — ABNORMAL LOW (ref 1.0–3.6)
MCH: 30.2 pg (ref 26.0–34.0)
MCHC: 33 g/dL (ref 32.0–36.0)
MCV: 91.6 fL (ref 80.0–100.0)
MONO ABS: 0.1 10*3/uL — AB (ref 0.2–0.9)
Monocytes Relative: 2 %
Neutro Abs: 2.1 10*3/uL (ref 1.4–6.5)
Neutrophils Relative %: 66 %
PLATELETS: 126 10*3/uL — AB (ref 150–440)
RBC: 2.89 MIL/uL — ABNORMAL LOW (ref 3.80–5.20)
RDW: 22.9 % — ABNORMAL HIGH (ref 11.5–14.5)
WBC: 3.1 10*3/uL — AB (ref 3.6–11.0)

## 2014-10-30 LAB — LACTIC ACID, PLASMA
Lactic Acid, Venous: 1.1 mmol/L (ref 0.5–2.0)
Lactic Acid, Venous: 0.8 mmol/L (ref 0.5–2.0)

## 2014-10-30 LAB — TROPONIN I

## 2014-10-30 LAB — MAGNESIUM: Magnesium: 1.8 mg/dL (ref 1.7–2.4)

## 2014-10-30 MED ORDER — ONDANSETRON HCL 4 MG/2ML IJ SOLN: 4.0000 mg | Freq: Four times a day (QID) | INTRAMUSCULAR | Status: DC | PRN

## 2014-10-30 MED ORDER — VITAMIN B-12 1000 MCG PO TABS
500.0000 ug | ORAL_TABLET | Freq: Every day | ORAL | Status: DC
  Administered 2014-10-31: 500 ug via ORAL
  Administered 2014-11-01: 10:00:00 via ORAL
  Administered 2014-11-02: 10:00:00 500 ug via ORAL
  Filled 2014-10-30 (×3): qty 1

## 2014-10-30 MED ORDER — LATANOPROST 0.005 % OP SOLN
1.0000 [drp] | Freq: Every day | OPHTHALMIC | Status: DC
  Administered 2014-10-30 – 2014-11-01 (×3): 1 [drp] via OPHTHALMIC
  Filled 2014-10-30: qty 2.5

## 2014-10-30 MED ORDER — ALLOPURINOL 100 MG PO TABS
300.0000 mg | ORAL_TABLET | Freq: Every day | ORAL | Status: DC
  Administered 2014-10-31 – 2014-11-02 (×3): 300 mg via ORAL
  Filled 2014-10-30 (×3): qty 3

## 2014-10-30 MED ORDER — FUROSEMIDE 40 MG PO TABS: 40.0000 mg | ORAL_TABLET | Freq: Two times a day (BID) | ORAL | Status: DC

## 2014-10-30 MED ORDER — VANCOMYCIN HCL IN DEXTROSE 1-5 GM/200ML-% IV SOLN
INTRAVENOUS | Status: AC
  Administered 2014-10-30: 1000 mg via INTRAVENOUS
  Filled 2014-10-30: qty 200

## 2014-10-30 MED ORDER — PANTOPRAZOLE SODIUM 40 MG PO TBEC
40.0000 mg | DELAYED_RELEASE_TABLET | Freq: Every day | ORAL | Status: DC
  Administered 2014-10-31 – 2014-11-02 (×3): 40 mg via ORAL
  Filled 2014-10-30 (×3): qty 1

## 2014-10-30 MED ORDER — SODIUM CHLORIDE 0.9 % IV BOLUS (SEPSIS)
500.0000 mL | INTRAVENOUS | Status: AC
  Administered 2014-10-30: 500 mL via INTRAVENOUS

## 2014-10-30 MED ORDER — AZACITIDINE CHEMO SQ INJECTION
75.0000 mg/m2 | Freq: Once | INTRAMUSCULAR | Status: AC
  Administered 2014-10-30: 112.5 mg via SUBCUTANEOUS
  Filled 2014-10-30: qty 4.5

## 2014-10-30 MED ORDER — SODIUM CHLORIDE 0.9 % IV BOLUS (SEPSIS)
1000.0000 mL | INTRAVENOUS | Status: AC
  Administered 2014-10-30: 1000 mL via INTRAVENOUS

## 2014-10-30 MED ORDER — ACETAMINOPHEN 650 MG RE SUPP
650.0000 mg | Freq: Four times a day (QID) | RECTAL | Status: DC | PRN
Start: 2014-10-30 — End: 2014-11-02

## 2014-10-30 MED ORDER — POTASSIUM CHLORIDE CRYS ER 20 MEQ PO TBCR
20.0000 meq | EXTENDED_RELEASE_TABLET | Freq: Two times a day (BID) | ORAL | Status: DC
  Administered 2014-10-30: 20 meq via ORAL
  Filled 2014-10-30: qty 1

## 2014-10-30 MED ORDER — LEVOTHYROXINE SODIUM 100 MCG PO TABS
100.0000 ug | ORAL_TABLET | Freq: Every day | ORAL | Status: DC
  Administered 2014-10-31 – 2014-11-02 (×3): 100 ug via ORAL
  Filled 2014-10-30 (×3): qty 1

## 2014-10-30 MED ORDER — FERROUS SULFATE 325 (65 FE) MG PO TABS
325.0000 mg | ORAL_TABLET | Freq: Every day | ORAL | Status: DC
  Administered 2014-10-31 – 2014-11-02 (×3): 325 mg via ORAL
  Filled 2014-10-30 (×3): qty 1

## 2014-10-30 MED ORDER — BRIMONIDINE TARTRATE 0.15 % OP SOLN
1.0000 [drp] | Freq: Two times a day (BID) | OPHTHALMIC | Status: DC
  Administered 2014-10-31 – 2014-11-02 (×4): 1 [drp] via OPHTHALMIC
  Filled 2014-10-30 (×2): qty 5

## 2014-10-30 MED ORDER — ONDANSETRON HCL 4 MG PO TABS
8.0000 mg | ORAL_TABLET | Freq: Once | ORAL | Status: AC
  Administered 2014-10-30: 8 mg via ORAL
  Filled 2014-10-30: qty 2

## 2014-10-30 MED ORDER — VANCOMYCIN HCL IN DEXTROSE 750-5 MG/150ML-% IV SOLN
750.0000 mg | INTRAVENOUS | Status: DC
  Administered 2014-10-31: 03:00:00 750 mg via INTRAVENOUS
  Filled 2014-10-30 (×2): qty 150

## 2014-10-30 MED ORDER — SOTALOL HCL 80 MG PO TABS
80.0000 mg | ORAL_TABLET | Freq: Two times a day (BID) | ORAL | Status: DC
  Administered 2014-10-30 – 2014-11-02 (×6): 80 mg via ORAL
  Filled 2014-10-30 (×6): qty 1

## 2014-10-30 MED ORDER — GLIPIZIDE ER 2.5 MG PO TB24
5.0000 mg | ORAL_TABLET | Freq: Every day | ORAL | Status: DC
  Administered 2014-10-31 – 2014-11-02 (×3): 5 mg via ORAL
  Filled 2014-10-30 (×3): qty 2

## 2014-10-30 MED ORDER — CALCIUM CARBONATE-VITAMIN D 500-200 MG-UNIT PO TABS
1.0000 | ORAL_TABLET | Freq: Every day | ORAL | Status: DC
  Administered 2014-10-31 – 2014-11-02 (×3): 1 via ORAL
  Filled 2014-10-30 (×3): qty 1

## 2014-10-30 MED ORDER — DEXTROSE 5 % IV SOLN
2.0000 g | Freq: Once | INTRAVENOUS | Status: DC
  Administered 2014-10-30: 2 g via INTRAVENOUS
  Filled 2014-10-30: qty 2

## 2014-10-30 MED ORDER — ONDANSETRON HCL 4 MG PO TABS: 4.0000 mg | ORAL_TABLET | Freq: Four times a day (QID) | ORAL | Status: DC | PRN

## 2014-10-30 MED ORDER — SIMVASTATIN 20 MG PO TABS
20.0000 mg | ORAL_TABLET | Freq: Every day | ORAL | Status: DC
  Administered 2014-10-30 – 2014-11-01 (×3): 20 mg via ORAL
  Filled 2014-10-30 (×3): qty 1

## 2014-10-30 MED ORDER — DEXTROSE 5 % IV SOLN
2.0000 g | INTRAVENOUS | Status: DC
  Administered 2014-10-30 – 2014-10-31 (×2): 2 g via INTRAVENOUS
  Filled 2014-10-30 (×3): qty 2

## 2014-10-30 MED ORDER — SODIUM CHLORIDE 0.9 % IV SOLN
INTRAVENOUS | Status: DC
  Administered 2014-10-30 – 2014-10-31 (×2): via INTRAVENOUS

## 2014-10-30 MED ORDER — VANCOMYCIN HCL IN DEXTROSE 1-5 GM/200ML-% IV SOLN
1000.0000 mg | Freq: Once | INTRAVENOUS | Status: AC
  Administered 2014-10-30: 1000 mg via INTRAVENOUS

## 2014-10-30 MED ORDER — ACETAMINOPHEN 325 MG PO TABS
650.0000 mg | ORAL_TABLET | Freq: Four times a day (QID) | ORAL | Status: DC | PRN
Start: 2014-10-30 — End: 2014-11-02

## 2014-10-30 MED ORDER — LOSARTAN POTASSIUM 50 MG PO TABS
100.0000 mg | ORAL_TABLET | Freq: Every day | ORAL | Status: DC
  Administered 2014-10-31 – 2014-11-02 (×3): 100 mg via ORAL
  Filled 2014-10-30 (×3): qty 2

## 2014-10-30 MED ORDER — AMLODIPINE BESYLATE 10 MG PO TABS
10.0000 mg | ORAL_TABLET | Freq: Every day | ORAL | Status: DC
Start: 2014-10-31 — End: 2014-10-31

## 2014-10-30 MED ORDER — VITAMIN D 1000 UNITS PO TABS
1000.0000 [IU] | ORAL_TABLET | Freq: Every day | ORAL | Status: DC
  Administered 2014-10-31 – 2014-11-02 (×3): 1000 [IU] via ORAL
  Filled 2014-10-30 (×3): qty 1

## 2014-10-30 MED ORDER — CARBIDOPA-LEVODOPA 25-100 MG PO TABS
1.0000 | ORAL_TABLET | Freq: Three times a day (TID) | ORAL | Status: DC
  Administered 2014-10-30 – 2014-11-02 (×8): 1 via ORAL
  Filled 2014-10-30 (×8): qty 1

## 2014-10-30 MED ORDER — ENOXAPARIN SODIUM 60 MG/0.6ML ~~LOC~~ SOLN
50.0000 mg | Freq: Every day | SUBCUTANEOUS | Status: DC
  Administered 2014-10-30 – 2014-11-01 (×3): 50 mg via SUBCUTANEOUS
  Filled 2014-10-30 (×3): qty 0.6

## 2014-10-30 NOTE — ED Notes (Signed)
Reports getting chemo, states that she started feeling weak and lightheaded yesterday and ca center told her she was dehydrated and gave fluids.  States she thinks she is still dehydrated.

## 2014-10-30 NOTE — ED Provider Notes (Signed)
[facility] Emergency Department Provider Note  ____________________________________________  Time seen: Approximately 5:00 PM  I have reviewed the triage vital signs and the nursing notes.   HISTORY  Chief Complaint Weakness   HPI [name] is a 79 y.o. female with a history of leukemia who is treated primarily at [facility] but receives chemotherapy here at [facility] directed by Dr. Grayland Ormond.  She presents with general malaise, weakness, lightheadedness, all for several days.  Shewent for her chemotherapy yesterday but because of how she felt the perform some lab work and found that she was mildly dehydrated.  They provided IV fluids but did not treat her with chemotherapy given her general weakness.  Today she continues to feel ill so she came to the emergency department.  She describes her overall state as severe, though she has no specific complaints.  Past Medical History  Diagnosis Date  . Atrial fibrillation   . Parkinson's disease   . Glaucoma   . Hypertension   . Hypothyroidism   . GERD (gastroesophageal reflux disease)   . High cholesterol   . Diabetes mellitus without complication   . CAD (coronary artery disease) of bypass graft   . History of appendectomy   . H/O: hysterectomy     Patient Active Problem List   Diagnosis Date Noted  . AML (acute myelogenous leukemia) 10/21/2014    Past Surgical History  Procedure Laterality Date  . Cardiac valve replacement    . Pacemaker insertion      Current Outpatient Rx  Name  Route  Sig  Dispense  Refill  . acetaminophen (TYLENOL) 325 MG tablet   Oral   Take 650 mg by mouth every 4 (four) hours as needed for mild pain or headache.         . allopurinol (ZYLOPRIM) 300 MG tablet   Oral   Take 300 mg by mouth daily.         Marland Kitchen amLODipine (NORVASC) 5 MG tablet   Oral   Take 10 mg by mouth daily.         . brimonidine (ALPHAGAN) 0.15 % ophthalmic solution    Both Eyes   Place 1 drop into both eyes 2 (two) times daily.         . Calcium Carb-Cholecalciferol (CALCIUM + D3) 600-200 MG-UNIT TABS   Oral   Take 1 tablet by mouth daily.         . carbidopa-levodopa (SINEMET IR) 25-100 MG per tablet   Oral   Take 1 tablet by mouth 3 (three) times daily.         . cholecalciferol (VITAMIN D) 1000 UNITS tablet   Oral   Take 1,000 Units by mouth daily.         Marland Kitchen enoxaparin (LOVENOX) 60 MG/0.6ML injection   Subcutaneous   Inject 60 mg into the skin daily.         . ferrous sulfate 325 (65 FE) MG tablet   Oral   Take 325 mg by mouth daily with breakfast.         . furosemide (LASIX) 40 MG tablet   Oral   Take 40 mg by mouth 2 (two) times daily.         Marland Kitchen glipiZIDE (GLUCOTROL XL) 5 MG 24 hr tablet   Oral   Take 5 mg by mouth daily.         Marland Kitchen latanoprost (XALATAN) 0.005 % ophthalmic solution   Both  Eyes   Place 1 drop into both eyes at bedtime.         Marland Kitchen levothyroxine (SYNTHROID, LEVOTHROID) 100 MCG tablet   Oral   Take 100 mcg by mouth daily before breakfast.         . losartan (COZAAR) 100 MG tablet   Oral   Take 100 mg by mouth daily.         Marland Kitchen omeprazole (PRILOSEC) 40 MG capsule   Oral   Take 40 mg by mouth 2 (two) times daily.         . potassium chloride SA (K-DUR,KLOR-CON) 20 MEQ tablet   Oral   Take 20 mEq by mouth 2 (two) times daily.         . simvastatin (ZOCOR) 20 MG tablet   Oral   Take 20 mg by mouth at bedtime.         . sotalol (BETAPACE) 80 MG tablet   Oral   Take 80 mg by mouth 2 (two) times daily.         . vitamin B-12 (CYANOCOBALAMIN) 250 MCG tablet   Oral   Take 250 mcg by mouth daily.         Marland Kitchen lidocaine-prilocaine (EMLA) cream      Apply to affected area once Patient not taking: Reported on 10/30/2014   30 g   3   . prochlorperazine (COMPAZINE) 10 MG tablet   Oral   Take 1 tablet (10 mg total) by mouth every 6 (six) hours as needed (Nausea or  vomiting). Patient not taking: Reported on 10/30/2014   30 tablet   1     Allergies Hydrochlorothiazide and Lisinopril  Family History  Problem Relation Age of Onset  . CAD Other     Social History History  Substance Use Topics  . Smoking status: Former Research scientist (life sciences)  . Smokeless tobacco: Not on file  . Alcohol Use: No    Review of Systems Constitutional: No fever/chills, "I am cold all the time " Eyes: No visual changes. ENT: No sore throat. Cardiovascular: Denies chest pain. Respiratory: Denies shortness of breath. Gastrointestinal: No abdominal pain.  nausea, no vomiting.  No diarrhea.  No constipation. Genitourinary: Negative for dysuria. Musculoskeletal: Negative for back pain. Skin: Negative for rash. Neurological: Negative for headaches, focal weakness or numbness.  10-point ROS otherwise negative.  ____________________________________________   PHYSICAL EXAM:  VITAL SIGNS: ED Triage Vitals  Enc Vitals Group     BP 10/30/14 1641 103/52 mmHg     Pulse Rate 10/30/14 1641 89     Resp 10/30/14 1641 16     Temp 10/30/14 1641 94.7 F (34.8 C)     Temp Source 10/30/14 1641 Oral     SpO2 10/30/14 1641 100 %     Weight 10/30/14 1641 115 lb (52.164 kg)     Height 10/30/14 1641 5' 2"  (1.575 m)     Head Cir --      Peak Flow --      Pain Score --      Pain Loc --      Pain Edu? --      Excl. in [place]? --     Constitutional: Alert and oriented. Well appearing and in no acute distress. Eyes: Conjunctivae are normal. PERRL. EOMI. Head: Atraumatic. Nose: No congestion/rhinnorhea. Mouth/Throat: Mucous membranes are moist.  Oropharynx non-erythematous. Neck: No stridor.   Cardiovascular: Normal rate, regular rhythm. Grossly normal heart sounds.  Good peripheral circulation.  Extensive scarring  on chest from prior attempts at port placement Respiratory: Normal respiratory effort.  No retractions. Lungs CTAB. Gastrointestinal: Soft and nontender. No distention. No  abdominal bruits. No CVA tenderness. Musculoskeletal: No lower extremity tenderness nor edema.  No joint effusions. Neurologic:  Normal speech and language. No gross focal neurologic deficits are appreciated. Speech is normal. No gait instability. Skin:  Skin is warm, dry and intact. No rash noted. Psychiatric: Mood and affect are normal. Speech and behavior are normal.  ____________________________________________   LABS (all labs ordered are listed, but only abnormal results are displayed)  Labs Reviewed  COMPREHENSIVE METABOLIC PANEL - Abnormal; Notable for the following:    Sodium 134 (*)    Chloride 99 (*)    BUN 32 (*)    Creatinine, Ser 1.75 (*)    AST 13 (*)    ALT <5 (*)    GFR calc non Af Amer 27 (*)    GFR calc Af Amer 31 (*)    All other components within normal limits  CBC WITH DIFFERENTIAL/PLATELET - Abnormal; Notable for the following:    WBC 3.1 (*)    RBC 2.89 (*)    Hemoglobin 8.7 (*)    HCT 26.5 (*)    RDW 22.9 (*)    Platelets 126 (*)    Lymphs Abs 0.8 (*)    Monocytes Absolute 0.1 (*)    All other components within normal limits  URINALYSIS COMPLETEWITH MICROSCOPIC ([facility])  - Abnormal; Notable for the following:    Color, Urine YELLOW (*)    APPearance CLEAR (*)    Leukocytes, UA 1+ (*)    Squamous Epithelial / LPF 0-5 (*)    All other components within normal limits  CULTURE, BLOOD (ROUTINE X 2)  CULTURE, BLOOD (ROUTINE X 2)  URINE CULTURE  MAGNESIUM  LACTIC ACID, PLASMA  LACTIC ACID, PLASMA  TROPONIN I   ____________________________________________  EKG  ED ECG REPORT I, Tymere Depuy, the attending physician, personally viewed and interpreted this ECG.   Date: 10/30/2014  EKG Time: 18:54  Rate: 92  Rhythm: atrial fibrillation versus accelerated junctional rhythm, unable to determine but no P waves are present and rate is irregular and patient has history of A. fib  Axis: LAD  Intervals:none  ST&T Change: Non-specific ST segment /  T-wave changes, but no evidence of acute ischemia.   ____________________________________________  RADIOLOGY  Dg Chest Portable 1 View  10/30/2014   CLINICAL DATA:  Weakness and lightheaded since yesterday. Mild shortness of breath.  EXAM: PORTABLE CHEST - 1 VIEW  COMPARISON:  06/18/2014  FINDINGS: Sternotomy wires and left-sided pacemaker unchanged. There is a right-sided PICC line with tip overlying the expected region of the SVC. Lungs are adequately inflated with mild stable prominence of the bronchovascular markings over the right base and infrahilar regions. No focal consolidation or effusion. Mild stable cardiomegaly. There is calcified plaque over the aortic arch. Remainder of the exam is unchanged.  IMPRESSION: No acute cardiopulmonary disease.  Chronic stable prominence of the bronchovascular markings in the right base and infrahilar regions.  Stable cardiomegaly.   Electronically Signed   By: Marin Olp M.D.   On: 10/30/2014 18:17    ____________________________________________  PROCEDURES  Procedure(s) performed: None  Critical Care performed: Yes, see critical care note(s)   CRITICAL CARE Performed by: Hinda Kehr   Total critical care time: 45  Critical care time was exclusive of separately billable procedures and treating other patients.  Critical care was necessary to  treat or prevent imminent or life-threatening deterioration.  Critical care was time spent personally by me on the following activities: development of treatment plan with patient and/or surrogate as well as nursing, discussions with consultants, evaluation of patient's response to treatment, examination of patient, obtaining history from patient or surrogate, ordering and performing treatments and interventions, ordering and review of laboratory studies, ordering and review of radiographic studies, pulse oximetry and re-evaluation of patient's  condition.  ____________________________________________   INITIAL IMPRESSION / ASSESSMENT AND PLAN / ED COURSE  Pertinent labs & imaging results that were available during my care of the patient were reviewed by me and considered in my medical decision making (see chart for details).  The patient meets SIRS criteria given her hypothermia and leukopenia.  She is immunosuppressed on chemotherapy.  I have no obvious source right now but I am concerned about bacteremia from her PICC line.  I am giving her 30 mL per kilo normal saline bolus, empiric antibiotics for broad coverage (cefepime 2 g IV and vancomycin 1 g IV) , and will discuss the case with Dr. Grayland Ormond.  If he feels her needs can be met at [facility], I will admit her here; if not she may need transfer to [facility] for further care.  I discussed this with the patient and her husband and they understand and agree.  Though she is septic she does not have severe sepsis as she has a normal lactate, normal blood pressure, and is mentating appropriately.  ----------------------------------------- 7:08 PM on 10/30/2014 -----------------------------------------  I spoke by phone with Dr. Alyssa Grove and discussed the case with him.  He is comfortable admitting the patient to the hospitalist service and consult thing on her here rather than sending her to [facility].  The patient prefers to stay at [facility], so I will discuss the admission with the hospitalist. ____________________________________________  FINAL CLINICAL IMPRESSION(S) / ED DIAGNOSES  Final diagnoses:  Sepsis due to other etiology  Hypothermia, initial encounter  Leukopenia  Immunocompromised state    Hinda Kehr, MD 10/30/14 1928

## 2014-10-30 NOTE — Progress Notes (Signed)
Continue with vidaza treatment today 10/30/14 per Dr. Grayland Ormond.

## 2014-10-30 NOTE — ED Notes (Signed)
CALLED  CODE  SEPSIS  TO  Karen Chafe

## 2014-10-30 NOTE — Progress Notes (Signed)
ANTIBIOTIC CONSULT NOTE - INITIAL  Pharmacy Consult for Vancomycin/Cefepime Indication: rule out sepsis  Allergies  Allergen Reactions  . Hydrochlorothiazide Other (See Comments)    Reaction:  Unknown   . Lisinopril Cough    Patient Measurements: Height: 5\' 2"  (157.5 cm) Weight: 115 lb (52.164 kg) IBW/kg (Calculated) : 50.1   Vital Signs: Temp: 97.6 F (36.4 C) (05/24 1858) Temp Source: Oral (05/24 1858) BP: 117/57 mmHg (05/24 1900) Pulse Rate: 92 (05/24 1900) Intake/Output from previous day:   Intake/Output from this shift:    Labs:  Recent Labs  10/29/14 1418 10/30/14 1715  WBC 3.8 3.1*  HGB 8.9* 8.7*  PLT 131* 126*  CREATININE 2.32* 1.75*   Estimated Creatinine Clearance: 21.3 mL/min (by C-G formula based on Cr of 1.75). No results for input(s): VANCOTROUGH, VANCOPEAK, VANCORANDOM, GENTTROUGH, GENTPEAK, GENTRANDOM, TOBRATROUGH, TOBRAPEAK, TOBRARND, AMIKACINPEAK, AMIKACINTROU, AMIKACIN in the last 72 hours.   Kinetics:  Ke: 0.022, Vd: 36.5   Microbiology: No results found for this or any previous visit (from the past 720 hour(s)).  Medical History: Past Medical History  Diagnosis Date  . Atrial fibrillation   . Parkinson's disease   . Glaucoma   . Hypertension   . Hypothyroidism   . GERD (gastroesophageal reflux disease)   . High cholesterol   . Diabetes mellitus without complication   . CAD (coronary artery disease) of bypass graft   . History of appendectomy   . H/O: hysterectomy     Medications:  Anti-infectives    Start     Dose/Rate Route Frequency Ordered Stop   10/31/14 0300  vancomycin (VANCOCIN) IVPB 750 mg/150 ml premix     750 mg 150 mL/hr over 60 Minutes Intravenous Every 36 hours 10/30/14 2021     10/30/14 2030  ceFEPIme (MAXIPIME) 2 g in dextrose 5 % 50 mL IVPB     2 g 100 mL/hr over 30 Minutes Intravenous Every 24 hours 10/30/14 2021     10/30/14 1830  ceFEPIme (MAXIPIME) 2 g in dextrose 5 % 50 mL IVPB  Status:  Discontinued      2 g 100 mL/hr over 30 Minutes Intravenous  Once 10/30/14 1819 10/30/14 2021   10/30/14 1830  vancomycin (VANCOCIN) IVPB 1000 mg/200 mL premix     1,000 mg 200 mL/hr over 60 Minutes Intravenous  Once 10/30/14 1819 10/30/14 2001     Assessment: Patient with h/o AML on chemotherapy and port-a-cath infection presents with SIRS/sepsis to begin empiric abx.   Goal of Therapy:  Vancomycin trough level 15-20 mcg/ml  Plan:  1. Vancomycin 1000 mg iv once given then 750 mg iv q 36 h. Will check a trough with the third dose which will not be at steady-state.   2. Cefepime 2 g iv q 24 h.   Follow up culture results  Napoleon Form 10/30/2014,8:22 PM

## 2014-10-30 NOTE — H&P (Signed)
[facility] NAME: [name]    MR#:  [id number]  DATE OF BIRTH:  1935/06/23  DATE OF ADMISSION:  10/30/2014  PRIMARY CARE PHYSICIAN: Adin Hector, MD   REQUESTING/REFERRING PHYSICIAN: Dr. Les Pou  CHIEF COMPLAINT:   Chief Complaint  Patient presents with  . Weakness   Dizziness weakness and noted to be hypothermic  HISTORY OF PRESENT ILLNESS:  [name]  is a 79 y.o. female with a known history of chronic afibrillation, Parkinson's disease, hypertension, hypothyroidism, glaucoma, type 2 diabetes without complication, history of chronic disease, status post AICD, history of metallic aortic valve replacement, history of chemotherapy port infection, history of AML currently getting chemotherapy who presents to the hospital due to persistent dizziness and weakness.  She apparently was feeling dizzy yesterday when she went to the [facility]. They thought she was dehydrated she received some fluids although did not get her chemotherapy, although, today she was still feeling quite weak and dizzy and therefore came to the ER for further evaluation. Patient was noted to be hypothermic with a rectal temperature of 95 and a presumed diagnosis of systemic inflammatory response syndrome/sepsis was made and therefore she is being admitted for further evaluation.  Patient admits to chills but no documented fever.  She denies any chest pain and nausea vomiting abdominal pain diarrhea or any other associated symptoms presently.    PAST MEDICAL HISTORY:   Past Medical History  Diagnosis Date  . Atrial fibrillation   . Parkinson's disease   . Glaucoma   . Hypertension   . Hypothyroidism   . GERD (gastroesophageal reflux disease)   . High cholesterol   . Diabetes mellitus without complication   . CAD (coronary artery disease) of bypass graft   . History of appendectomy   . H/O: hysterectomy     PAST SURGICAL HISTORY:    Past Surgical History  Procedure Laterality Date  . Cardiac valve replacement    . Pacemaker insertion      SOCIAL HISTORY:   History  Substance Use Topics  . Smoking status: Former Smoker -- 1.00 packs/day for 10 years    Types: Cigarettes  . Smokeless tobacco: Not on file  . Alcohol Use: No    FAMILY HISTORY:   Family History  Problem Relation Age of Onset  . CAD Other   . Heart disease Father     DRUG ALLERGIES:   Allergies  Allergen Reactions  . Hydrochlorothiazide Other (See Comments)    Reaction:  Unknown   . Lisinopril Cough    REVIEW OF SYSTEMS:   Review of Systems  Constitutional: Negative for fever and weight loss.  HENT: Negative for congestion, nosebleeds and tinnitus.   Eyes: Negative for blurred vision, double vision and redness.  Respiratory: Negative for cough, hemoptysis and shortness of breath.   Cardiovascular: Negative for chest pain, orthopnea, leg swelling and PND.  Gastrointestinal: Negative for nausea, vomiting, abdominal pain, diarrhea and melena.  Genitourinary: Negative for dysuria, urgency and hematuria.  Musculoskeletal: Negative for joint pain and falls.  Skin: Negative for rash.  Neurological: Positive for dizziness and weakness (generalized). Negative for tingling, sensory change, focal weakness, seizures and headaches.  Endo/Heme/Allergies: Negative for polydipsia. Does not bruise/bleed easily.  Psychiatric/Behavioral: Negative for depression and memory loss. The patient is not nervous/anxious.     MEDICATIONS AT HOME:   Prior to Admission medications   Medication Sig Start Date End Date Taking? Authorizing  Provider  acetaminophen (TYLENOL) 325 MG tablet Take 650 mg by mouth every 4 (four) hours as needed for mild pain or headache.   Yes Historical Provider, MD  allopurinol (ZYLOPRIM) 300 MG tablet Take 300 mg by mouth daily.   Yes Historical Provider, MD  amLODipine (NORVASC) 5 MG tablet Take 10 mg by mouth daily.   Yes  Historical Provider, MD  brimonidine (ALPHAGAN) 0.15 % ophthalmic solution Place 1 drop into both eyes 2 (two) times daily.   Yes Historical Provider, MD  Calcium Carb-Cholecalciferol (CALCIUM + D3) 600-200 MG-UNIT TABS Take 1 tablet by mouth daily.   Yes Historical Provider, MD  carbidopa-levodopa (SINEMET IR) 25-100 MG per tablet Take 1 tablet by mouth 3 (three) times daily.   Yes Historical Provider, MD  cholecalciferol (VITAMIN D) 1000 UNITS tablet Take 1,000 Units by mouth daily.   Yes Historical Provider, MD  enoxaparin (LOVENOX) 60 MG/0.6ML injection Inject 60 mg into the skin daily.   Yes Historical Provider, MD  ferrous sulfate 325 (65 FE) MG tablet Take 325 mg by mouth daily with breakfast.   Yes Historical Provider, MD  furosemide (LASIX) 40 MG tablet Take 40 mg by mouth 2 (two) times daily.   Yes Historical Provider, MD  glipiZIDE (GLUCOTROL XL) 5 MG 24 hr tablet Take 5 mg by mouth daily.   Yes Historical Provider, MD  latanoprost (XALATAN) 0.005 % ophthalmic solution Place 1 drop into both eyes at bedtime.   Yes Historical Provider, MD  levothyroxine (SYNTHROID, LEVOTHROID) 100 MCG tablet Take 100 mcg by mouth daily before breakfast.   Yes Historical Provider, MD  losartan (COZAAR) 100 MG tablet Take 100 mg by mouth daily.   Yes Historical Provider, MD  omeprazole (PRILOSEC) 40 MG capsule Take 40 mg by mouth 2 (two) times daily.   Yes Historical Provider, MD  potassium chloride SA (K-DUR,KLOR-CON) 20 MEQ tablet Take 20 mEq by mouth 2 (two) times daily.   Yes Historical Provider, MD  simvastatin (ZOCOR) 20 MG tablet Take 20 mg by mouth at bedtime.   Yes Historical Provider, MD  sotalol (BETAPACE) 80 MG tablet Take 80 mg by mouth 2 (two) times daily.   Yes Historical Provider, MD  vitamin B-12 (CYANOCOBALAMIN) 250 MCG tablet Take 250 mcg by mouth daily.   Yes Historical Provider, MD  lidocaine-prilocaine (EMLA) cream Apply to affected area once Patient not taking: Reported on 10/30/2014  10/22/14   Lloyd Huger, MD  prochlorperazine (COMPAZINE) 10 MG tablet Take 1 tablet (10 mg total) by mouth every 6 (six) hours as needed (Nausea or vomiting). Patient not taking: Reported on 10/30/2014 10/22/14   Lloyd Huger, MD      VITAL SIGNS:  Blood pressure 117/57, pulse 92, temperature 97.6 F (36.4 C), temperature source Oral, resp. rate 19, height 5\' 2"  (1.575 m), weight 52.164 kg (115 lb), SpO2 94 %.  PHYSICAL EXAMINATION:  Physical Exam  GENERAL:  79 y.o.-year-old female patient lying in the bed with no acute distress.  EYES: Pupils equal, round, reactive to light and accommodation. No scleral icterus. Extraocular muscles intact.  HEENT: Head atraumatic, normocephalic. Oropharynx and nasopharynx clear. No oropharyngeal erythema, moist oral mucosa  NECK:  Supple, no jugular venous distention. No thyroid enlargement, no tenderness.  LUNGS: Normal breath sounds bilaterally, no wheezing, rales, rhonchi. No use of accessory muscles of respiration.  CARDIOVASCULAR: S1, S2.  + II/VI SEM at RSB.  + metallic valve click.  No rubs, gallops.  ABDOMEN: Soft, nontender, nondistended.  Bowel sounds present. No organomegaly or mass.  EXTREMITIES: No pedal edema, cyanosis, or clubbing. + 2 pedal & radial pulses b/l.   NEUROLOGIC: Cranial nerves II through XII are intact. No focal Motor or sensory deficits appreciated b/l. Globally weak.  PSYCHIATRIC: The patient is alert and oriented x 3. Good affect.  SKIN: No obvious rash, lesion, or ulcer.   LABORATORY PANEL:   CBC  Recent Labs Lab 10/30/14 1715  WBC 3.1*  HGB 8.7*  HCT 26.5*  PLT 126*   ------------------------------------------------------------------------------------------------------------------  Chemistries   Recent Labs Lab 10/30/14 1715  NA 134*  K 3.8  CL 99*  CO2 26  GLUCOSE 93  BUN 32*  CREATININE 1.75*  CALCIUM 9.3  MG 1.8  AST 13*  ALT <5*  ALKPHOS 78  BILITOT 0.4    ------------------------------------------------------------------------------------------------------------------  Cardiac Enzymes No results for input(s): TROPONINI in the last 168 hours. ------------------------------------------------------------------------------------------------------------------  RADIOLOGY:  Dg Chest Portable 1 View  10/30/2014   CLINICAL DATA:  Weakness and lightheaded since yesterday. Mild shortness of breath.  EXAM: PORTABLE CHEST - 1 VIEW  COMPARISON:  06/18/2014  FINDINGS: Sternotomy wires and left-sided pacemaker unchanged. There is a right-sided PICC line with tip overlying the expected region of the SVC. Lungs are adequately inflated with mild stable prominence of the bronchovascular markings over the right base and infrahilar regions. No focal consolidation or effusion. Mild stable cardiomegaly. There is calcified plaque over the aortic arch. Remainder of the exam is unchanged.  IMPRESSION: No acute cardiopulmonary disease.  Chronic stable prominence of the bronchovascular markings in the right base and infrahilar regions.  Stable cardiomegaly.   Electronically Signed   By: Marin Olp M.D.   On: 10/30/2014 18:17     IMPRESSION AND PLAN:   79 year old female with past medical history of chronic atrial fibrillation, history of rheumatic heart disease with a metallic mitral valve replacement, history of line sepsis secondary to infected Port-A-Cath, hypertension hyperlipidemia, Parkinson's disease, GERD, hypothyroidism, AML currently getting ongoing chemotherapy presents to the hospital due to persistent weakness and dizziness and noted to be hypothermic with a temperature of 95.2.   #1 systemic inflammatory response syndrome/sepsis - presented with hypothermia and mild tachycardia. Patient is a high risk patient for sepsis given her previous history of line sepsis and currently being immunosuppressed getting ongoing chemotherapy.  - No clear source of infection  at this time as patient's chest x-ray is negative and urinalysis is negative  - Empirically treat patient with IV vancomycin and cefepime follow blood and urine cultures follow hemodynamics.  If her cultures remained negative for 48 hours then she likely can be discharged home  #2 acute myelogenous leukemia - patient is currently getting chemotherapy.  [facility] consult oncology and is followed by Dr. Grayland Ormond  #3 Parkinson's disease - continue Sinemet  #4 Glaucoma - continue latanoprost, brimonidine eyedrops  #5 hypothyroidism continue Synthroid  #6 hypertension presently hemodynamically stable - Continue losartan Norvasc  #7 chronic atrial fibrillation - rate controlled and continue sotalol - Continue Lovenox for anticoagulation  #8 . GERD continue Protonix  #9 hyperlipidemia continue simvastatin  #10 type 2 diabetes without complication continue glipizide  #11 history of previous DVT continue Lovenox   All the records are reviewed and case discussed with ED provider. Management plans discussed with the patient, family and they are in agreement.  CODE STATUS: Full  TOTAL TIME TAKING CARE OF THIS PATIENT: 50 minutes.    Henreitta Leber M.D on 10/30/2014 at 7:38 PM  Between 7am to 6pm - Pager - [phone]  After 6pm go to [email or web address] - password [email or web address] [facility]  [facility]  Office  [phone]  CC: Primary care physician; Adin Hector, MD

## 2014-10-31 ENCOUNTER — Inpatient Hospital Stay: Payer: Medicare Other

## 2014-10-31 DIAGNOSIS — R531 Weakness: Secondary | ICD-10-CM

## 2014-10-31 DIAGNOSIS — R42 Dizziness and giddiness: Secondary | ICD-10-CM

## 2014-10-31 DIAGNOSIS — D61810 Antineoplastic chemotherapy induced pancytopenia: Secondary | ICD-10-CM | POA: Diagnosis present

## 2014-10-31 DIAGNOSIS — Z515 Encounter for palliative care: Secondary | ICD-10-CM

## 2014-10-31 DIAGNOSIS — I10 Essential (primary) hypertension: Secondary | ICD-10-CM | POA: Diagnosis present

## 2014-10-31 DIAGNOSIS — E039 Hypothyroidism, unspecified: Secondary | ICD-10-CM

## 2014-10-31 DIAGNOSIS — Z79899 Other long term (current) drug therapy: Secondary | ICD-10-CM

## 2014-10-31 DIAGNOSIS — R509 Fever, unspecified: Secondary | ICD-10-CM

## 2014-10-31 DIAGNOSIS — I2510 Atherosclerotic heart disease of native coronary artery without angina pectoris: Secondary | ICD-10-CM | POA: Diagnosis present

## 2014-10-31 DIAGNOSIS — C9200 Acute myeloblastic leukemia, not having achieved remission: Secondary | ICD-10-CM

## 2014-10-31 DIAGNOSIS — I4891 Unspecified atrial fibrillation: Secondary | ICD-10-CM | POA: Diagnosis present

## 2014-10-31 DIAGNOSIS — I129 Hypertensive chronic kidney disease with stage 1 through stage 4 chronic kidney disease, or unspecified chronic kidney disease: Secondary | ICD-10-CM

## 2014-10-31 DIAGNOSIS — G620 Drug-induced polyneuropathy: Secondary | ICD-10-CM

## 2014-10-31 DIAGNOSIS — D61818 Other pancytopenia: Secondary | ICD-10-CM

## 2014-10-31 DIAGNOSIS — R5381 Other malaise: Secondary | ICD-10-CM

## 2014-10-31 DIAGNOSIS — T451X5A Adverse effect of antineoplastic and immunosuppressive drugs, initial encounter: Secondary | ICD-10-CM | POA: Diagnosis present

## 2014-10-31 DIAGNOSIS — E119 Type 2 diabetes mellitus without complications: Secondary | ICD-10-CM

## 2014-10-31 DIAGNOSIS — I509 Heart failure, unspecified: Secondary | ICD-10-CM

## 2014-10-31 DIAGNOSIS — Z952 Presence of prosthetic heart valve: Secondary | ICD-10-CM

## 2014-10-31 DIAGNOSIS — G20 Parkinson's disease: Secondary | ICD-10-CM

## 2014-10-31 DIAGNOSIS — N183 Chronic kidney disease, stage 3 (moderate): Secondary | ICD-10-CM

## 2014-10-31 LAB — CBC
HCT: 22.5 % — ABNORMAL LOW (ref 35.0–47.0)
Hemoglobin: 7.5 g/dL — ABNORMAL LOW (ref 12.0–16.0)
MCH: 30.3 pg (ref 26.0–34.0)
MCHC: 33.2 g/dL (ref 32.0–36.0)
MCV: 91.4 fL (ref 80.0–100.0)
Platelets: 106 10*3/uL — ABNORMAL LOW (ref 150–440)
RBC: 2.47 MIL/uL — ABNORMAL LOW (ref 3.80–5.20)
RDW: 22.8 % — ABNORMAL HIGH (ref 11.5–14.5)
WBC: 3 10*3/uL — AB (ref 3.6–11.0)

## 2014-10-31 LAB — BASIC METABOLIC PANEL
Anion gap: 3 — ABNORMAL LOW (ref 5–15)
BUN: 27 mg/dL — AB (ref 6–20)
CO2: 26 mmol/L (ref 22–32)
Calcium: 9.3 mg/dL (ref 8.9–10.3)
Chloride: 110 mmol/L (ref 101–111)
Creatinine, Ser: 1.49 mg/dL — ABNORMAL HIGH (ref 0.44–1.00)
GFR calc Af Amer: 38 mL/min — ABNORMAL LOW (ref >60)
GFR calc non Af Amer: 33 mL/min — ABNORMAL LOW (ref >60)
Glucose, Bld: 73 mg/dL (ref 65–99)
Potassium: 3.8 mmol/L (ref 3.5–5.1)
SODIUM: 139 mmol/L (ref 135–145)

## 2014-10-31 LAB — TSH: TSH: 0.104 u[IU]/mL — ABNORMAL LOW (ref 0.350–4.500)

## 2014-10-31 LAB — PREPARE RBC (CROSSMATCH)

## 2014-10-31 LAB — SEDIMENTATION RATE: Sed Rate: 7 mm/hr (ref 0–30)

## 2014-10-31 LAB — ABO/RH: ABO/RH(D): O POS

## 2014-10-31 MED ORDER — ACETAMINOPHEN 325 MG PO TABS
650.0000 mg | ORAL_TABLET | Freq: Once | ORAL | Status: AC
  Administered 2014-10-31: 17:00:00 650 mg via ORAL
  Filled 2014-10-31: qty 2

## 2014-10-31 MED ORDER — AMLODIPINE BESYLATE 5 MG PO TABS: 2.5000 mg | ORAL_TABLET | Freq: Every day | ORAL | Status: DC

## 2014-10-31 MED ORDER — SODIUM CHLORIDE 0.9 % IV SOLN
Freq: Once | INTRAVENOUS | Status: AC
  Administered 2014-10-31: 17:00:00 via INTRAVENOUS

## 2014-10-31 NOTE — Progress Notes (Signed)
ANTIBIOTIC CONSULT NOTE - INITIAL  Pharmacy Consult for Vancomycin/Cefepime Indication: rule out sepsis  Allergies  Allergen Reactions  . Hydrochlorothiazide Other (See Comments)    Reaction:  Unknown   . Lisinopril Cough    Patient Measurements: Height: 5\' 2"  (157.5 cm) Weight: 117 lb (53.071 kg) IBW/kg (Calculated) : 50.1   Vital Signs: Temp: 98 F (36.7 C) (05/25 0501) Temp Source: Oral (05/25 0501) BP: 108/55 mmHg (05/25 0501) Pulse Rate: 62 (05/25 0501)  Labs:  Recent Labs  10/29/14 1418 10/30/14 1715 10/31/14 0443  WBC 3.8 3.1* 3.0*  HGB 8.9* 8.7* 7.5*  PLT 131* 126* 106*  CREATININE 2.32* 1.75* 1.49*   Estimated Creatinine Clearance: 25 mL/min (by C-G formula based on Cr of 1.49).  Kinetics:  Ke: 0.022, Vd: 36.5   Microbiology: No results found for this or any previous visit (from the past 720 hour(s)).  Medications:  Anti-infectives    Start     Dose/Rate Route Frequency Ordered Stop   10/31/14 0300  vancomycin (VANCOCIN) IVPB 750 mg/150 ml premix     750 mg 150 mL/hr over 60 Minutes Intravenous Every 36 hours 10/30/14 2021     10/30/14 2030  ceFEPIme (MAXIPIME) 2 g in dextrose 5 % 50 mL IVPB     2 g 100 mL/hr over 30 Minutes Intravenous Every 24 hours 10/30/14 2021     10/30/14 1830  ceFEPIme (MAXIPIME) 2 g in dextrose 5 % 50 mL IVPB  Status:  Discontinued     2 g 100 mL/hr over 30 Minutes Intravenous  Once 10/30/14 1819 10/30/14 2021   10/30/14 1830  vancomycin (VANCOCIN) IVPB 1000 mg/200 mL premix     1,000 mg 200 mL/hr over 60 Minutes Intravenous  Once 10/30/14 1819 10/30/14 2001     Assessment: Patient with h/o AML on chemotherapy and port-a-cath infection presents with SIRS/sepsis to begin empiric abx.   Goal of Therapy:  Vancomycin trough level 15-20 mcg/ml  Plan:  Continue vancomycin 750 mg iv q 36 h. Will check a trough with the third dose which will not be at steady-state.   Continue cefepime 2 g iv q 24 h.   Follow up culture  results   Rexene Edison, PharmD Clinical Pharmacist 10/31/2014,10:25 AM

## 2014-10-31 NOTE — Progress Notes (Signed)
SUBJECTIVE:  Pt with h/o AML, undergoing chemotherapy through [facility], with recent prolonged hospital stays at [facility] due to portacath infections/chest wall cellulitis, with h/o chronic left side systolic CHF, mech aortic valve, afib, multiple prior admissions for acute blood loss anemia, typically with dizziness as presenting symptom.  Has been on lovenox for anticoagulation, as felt too high risk for oral anticoagulants.  Developed dizziness with occ vertigo symptoms, consistent with prior anemia symptoms; also noted to have hypothermia.  Admitted with SIRS; labs with progressive pancytopenia (though also on IVF).  On broad spectrum abx; cultures negative to date.  Denies other symptoms other than dizziness, though has some confusion as to recent treatment, events.  ______________________________________________________________________  ROS: Please see HPI; remainder of complete 10 point ROS is negative   Past Medical History  Diagnosis Date  . Atrial fibrillation   . Parkinson's disease   . Glaucoma   . Hypertension   . Hypothyroidism   . GERD (gastroesophageal reflux disease)   . High cholesterol   . Diabetes mellitus without complication   . CAD (coronary artery disease) of bypass graft   . History of appendectomy   . H/O: hysterectomy   . Leukemia     Past Surgical History  Procedure Laterality Date  . Cardiac valve replacement    . Pacemaker insertion       Current facility-administered medications:  .  0.9 %  sodium chloride infusion, , Intravenous, Continuous, Henreitta Leber, MD, Last Rate: 75 mL/hr at 10/30/14 2239 .  0.9 %  sodium chloride infusion, , Intravenous, Once, Tama High III, MD .  acetaminophen (TYLENOL) tablet 650 mg, 650 mg, Oral, Q6H PRN **OR** acetaminophen (TYLENOL) suppository 650 mg, 650 mg, Rectal, Q6H PRN, Henreitta Leber, MD .  acetaminophen (TYLENOL) tablet 650 mg, 650 mg, Oral, Once, Tama High III, MD .  allopurinol (ZYLOPRIM) tablet  300 mg, 300 mg, Oral, Daily, Henreitta Leber, MD, 300 mg at 10/30/14 2227 .  brimonidine (ALPHAGAN) 0.15 % ophthalmic solution 1 drop, 1 drop, Both Eyes, BID, Henreitta Leber, MD, 1 drop at 10/30/14 2227 .  calcium-vitamin D (OSCAL WITH D) 500-200 MG-UNIT per tablet 1 tablet, 1 tablet, Oral, Daily, Henreitta Leber, MD, 1 tablet at 10/30/14 2228 .  carbidopa-levodopa (SINEMET IR) 25-100 MG per tablet immediate release 1 tablet, 1 tablet, Oral, TID, Henreitta Leber, MD, 1 tablet at 10/30/14 2238 .  ceFEPIme (MAXIPIME) 2 g in dextrose 5 % 50 mL IVPB, 2 g, Intravenous, Q24H, Henreitta Leber, MD, 2 g at 10/30/14 2239 .  cholecalciferol (VITAMIN D) tablet 1,000 Units, 1,000 Units, Oral, Daily, Henreitta Leber, MD, 1,000 Units at 10/30/14 2228 .  enoxaparin (LOVENOX) injection 50 mg, 50 mg, Subcutaneous, Daily, Henreitta Leber, MD, 50 mg at 10/30/14 2238 .  ferrous sulfate tablet 325 mg, 325 mg, Oral, Q breakfast, Henreitta Leber, MD, 325 mg at 10/31/14 0745 .  glipiZIDE (GLUCOTROL XL) 24 hr tablet 5 mg, 5 mg, Oral, Q breakfast, Henreitta Leber, MD, 5 mg at 10/31/14 0745 .  latanoprost (XALATAN) 0.005 % ophthalmic solution 1 drop, 1 drop, Both Eyes, QHS, Henreitta Leber, MD, 1 drop at 10/30/14 2239 .  levothyroxine (SYNTHROID, LEVOTHROID) tablet 100 mcg, 100 mcg, Oral, QAC breakfast, Henreitta Leber, MD, 100 mcg at 10/31/14 0745 .  losartan (COZAAR) tablet 100 mg, 100 mg, Oral, Daily, Henreitta Leber, MD .  ondansetron (ZOFRAN) tablet 4 mg, 4 mg, Oral, Q6H  PRN **OR** ondansetron (ZOFRAN) injection 4 mg, 4 mg, Intravenous, Q6H PRN, Henreitta Leber, MD .  pantoprazole (PROTONIX) EC tablet 40 mg, 40 mg, Oral, Daily, Henreitta Leber, MD, 40 mg at 10/30/14 2229 .  simvastatin (ZOCOR) tablet 20 mg, 20 mg, Oral, QHS, Henreitta Leber, MD, 20 mg at 10/30/14 2238 .  sotalol (BETAPACE) tablet 80 mg, 80 mg, Oral, BID, Henreitta Leber, MD, 80 mg at 10/30/14 2238 .  vancomycin (VANCOCIN) IVPB 750 mg/150 ml premix, 750  mg, Intravenous, Q36H, Henreitta Leber, MD, 750 mg at 10/31/14 0249 .  vitamin B-12 (CYANOCOBALAMIN) tablet 500 mcg, 500 mcg, Oral, Daily, Henreitta Leber, MD, 500 mcg at 10/30/14 2229  Facility-Administered Medications Ordered in Other Encounters:  .  0.9 %  sodium chloride infusion, , Intravenous, Continuous, Lloyd Huger, MD, Last Rate: 999 mL/hr at 10/29/14 1550  PHYSICAL EXAM:  BP 108/55 mmHg  Pulse 62  Temp(Src) 98 F (36.7 C) (Oral)  Resp 20  Ht 5\' 2"  (1.575 m)  Wt 53.071 kg (117 lb)  BMI 21.39 kg/m2  SpO2 100%  General: pleasant  female, in NAD HEENT: conjunctiva pale; OP moist without lesions. Neck: supple, trachea midline, no thyromegaly Chest: hyperpigmentation from prior inflammation; no erythema or tenderness Lungs: clear bilaterally without retractions or wheezes Cardiovascular: irr irr with mech heart valve and 3/6 SEM Abdomen: soft, nontender, nondistended, positive bowel sounds Extremities: no clubbing, cyanosis, edema Neuro: alert but confused at times, moves all extremities Derm: no significant rashes or nodules; decreased skin turgor Lymph: no cervical or supraclavicular lymphadenopathy  Labs and imaging studies were reviewed  ASSESSMENT/PLAN:   1. Pancytopenia- given similarity of symptoms to prior episodes anemia, would transfuse 1 u pRBC and follow symptoms; risks/benefits of transfusion discussed with pt, and she has had multiple prior.  CT abd/pelvis to exclude retroperitoneal bleed.  Watch for GI bleeding.  Oncology to see for this and AML; not clear when she last received chemotherapy 2. HTN- BP low; holding amlodipine and stop lasix/potassium; following lytes 3. CKD stage 3- consistent with prior labs 4. Afib/chronic left side systolic CHF- cont sotalol; no other BP meds due to hypotension 5. Parkinson's- cont home meds. 6. mech heart valve- continue anticoagulation unless clear evidence of bleeding noted

## 2014-10-31 NOTE — Plan of Care (Signed)
Problem: Discharge Progression Outcomes Goal: Other Discharge Outcomes/Goals Outcome: Progressing Patient is to discharge home with husband  Patient had no c/o pain this shift,  Patient Hemo was 7.5 and she was given a unit of blood still infusing at shifts end  Patient is tolerating diet well

## 2014-10-31 NOTE — Consult Note (Signed)
[name]  [phone]) [phone] [phone]) [phone]  ID: [name] OB: [birth year]  MR#: [id number]  LOV#:[id number]  Patient Care Team: Adin Hector, MD as PCP - General (Internal Medicine)  CHIEF COMPLAINT:  Chief Complaint  Patient presents with  . Weakness    INTERVAL HISTORY: Patient is a 79 year old female with a history of acute leukemia who presented to the emergency room with increased weakness and fatigue and hypothermia. She currently feels well and improved since admission. She has a fair appetite. She does not complain of chest pain or shortness of breath. She denies any nausea, vomiting, constipation, or diarrhea. She has no urinary complaints. Patient offers no further specific complaints.  REVIEW OF SYSTEMS:   Review of Systems  Constitutional: Positive for malaise/fatigue. Negative for fever.  Respiratory: Negative for shortness of breath.   Cardiovascular: Negative for chest pain.  Neurological: Positive for weakness.    As per HPI. Otherwise, a complete review of systems is negatve.  PAST MEDICAL HISTORY: Past Medical History  Diagnosis Date  . Atrial fibrillation   . Parkinson's disease   . Glaucoma   . Hypertension   . Hypothyroidism   . GERD (gastroesophageal reflux disease)   . High cholesterol   . Diabetes mellitus without complication   . CAD (coronary artery disease) of bypass graft   . History of appendectomy   . H/O: hysterectomy   . Leukemia     PAST SURGICAL HISTORY: Past Surgical History  Procedure Laterality Date  . Cardiac valve replacement    . Pacemaker insertion      FAMILY HISTORY Family History  Problem Relation Age of Onset  . CAD Other   . Heart disease Father        ADVANCED DIRECTIVES:    HEALTH MAINTENANCE: History  Substance Use Topics  . Smoking status: Former Smoker -- 1.00 packs/day for 10 years    Types: Cigarettes  . Smokeless tobacco: Not on file  . Alcohol Use: No      Colonoscopy:  PAP:  Bone density:  Lipid panel:  Allergies  Allergen Reactions  . Hydrochlorothiazide Other (See Comments)    Reaction:  Unknown   . Lisinopril Cough    Current Facility-Administered Medications  Medication Dose Route Frequency Provider Last Rate Last Dose  . 0.9 %  sodium chloride infusion   Intravenous Continuous Henreitta Leber, MD 75 mL/hr at 10/31/14 1340    . acetaminophen (TYLENOL) tablet 650 mg  650 mg Oral Q6H PRN Henreitta Leber, MD       Or  . acetaminophen (TYLENOL) suppository 650 mg  650 mg Rectal Q6H PRN Henreitta Leber, MD      . allopurinol (ZYLOPRIM) tablet 300 mg  300 mg Oral Daily Henreitta Leber, MD   300 mg at 10/31/14 0951  . brimonidine (ALPHAGAN) 0.15 % ophthalmic solution 1 drop  1 drop Both Eyes BID Henreitta Leber, MD   1 drop at 10/30/14 2227  . calcium-vitamin D (OSCAL WITH D) 500-200 MG-UNIT per tablet 1 tablet  1 tablet Oral Daily Henreitta Leber, MD   1 tablet at 10/31/14 [phone]  . carbidopa-levodopa (SINEMET IR) 25-100 MG per tablet immediate release 1 tablet  1 tablet Oral TID Henreitta Leber, MD   1 tablet at 10/31/14 1652  . ceFEPIme (MAXIPIME) 2 g in dextrose 5 % 50 mL IVPB  2 g Intravenous Q24H Henreitta Leber, MD   2 g at 10/30/14  2239  . cholecalciferol (VITAMIN D) tablet 1,000 Units  1,000 Units Oral Daily Henreitta Leber, MD   1,000 Units at 10/31/14 [phone]  . enoxaparin (LOVENOX) injection 50 mg  50 mg Subcutaneous Daily Henreitta Leber, MD   50 mg at 10/31/14 1216  . ferrous sulfate tablet 325 mg  325 mg Oral Q breakfast Henreitta Leber, MD   325 mg at 10/31/14 0745  . glipiZIDE (GLUCOTROL XL) 24 hr tablet 5 mg  5 mg Oral Q breakfast Henreitta Leber, MD   5 mg at 10/31/14 0745  . latanoprost (XALATAN) 0.005 % ophthalmic solution 1 drop  1 drop Both Eyes QHS Henreitta Leber, MD   1 drop at 10/30/14 2239  . levothyroxine (SYNTHROID, LEVOTHROID) tablet 100 mcg  100 mcg Oral QAC breakfast Henreitta Leber, MD   100 mcg at 10/31/14  0745  . losartan (COZAAR) tablet 100 mg  100 mg Oral Daily Henreitta Leber, MD   100 mg at 10/31/14 0950  . ondansetron (ZOFRAN) tablet 4 mg  4 mg Oral Q6H PRN Henreitta Leber, MD       Or  . ondansetron (ZOFRAN) injection 4 mg  4 mg Intravenous Q6H PRN Henreitta Leber, MD      . pantoprazole (PROTONIX) EC tablet 40 mg  40 mg Oral Daily Henreitta Leber, MD   40 mg at 10/31/14 0951  . simvastatin (ZOCOR) tablet 20 mg  20 mg Oral QHS Henreitta Leber, MD   20 mg at 10/30/14 2238  . sotalol (BETAPACE) tablet 80 mg  80 mg Oral BID Henreitta Leber, MD   80 mg at 10/31/14 0950  . vancomycin (VANCOCIN) IVPB 750 mg/150 ml premix  750 mg Intravenous Q36H Henreitta Leber, MD   750 mg at 10/31/14 0249  . vitamin B-12 (CYANOCOBALAMIN) tablet 500 mcg  500 mcg Oral Daily Henreitta Leber, MD   500 mcg at 10/31/14 4580   Facility-Administered Medications Ordered in Other Encounters  Medication Dose Route Frequency Provider Last Rate Last Dose  . 0.9 %  sodium chloride infusion   Intravenous Continuous Lloyd Huger, MD 999 mL/hr at 10/29/14 1550      OBJECTIVE: Filed Vitals:   10/31/14 1730  BP: 121/59  Pulse: 71  Temp: 98 F (36.7 C)  Resp: 19     Body mass index is 21.39 kg/(m^2).    ECOG FS:3 - Symptomatic, >50% confined to bed  General: Ill-appearing, no acute distress. Eyes: Pink conjunctiva, anicteric sclera. HEENT: Normocephalic, moist mucous membranes, clear oropharnyx. Lungs: Clear to auscultation bilaterally. Heart: Regular rate and rhythm. No rubs, murmurs, or gallops. Abdomen: Soft, nontender, nondistended. No organomegaly noted, normoactive bowel sounds. Musculoskeletal: No edema, cyanosis, or clubbing. Neuro: Alert, answering all questions appropriately. Cranial nerves grossly intact. Skin: No rashes or petechiae noted. Psych: Normal affect.   LAB RESULTS:  Lab Results  Component Value Date   NA 139 10/31/2014   K 3.8 10/31/2014   CL 110 10/31/2014   CO2 26 10/31/2014     GLUCOSE 73 10/31/2014   BUN 27* 10/31/2014   CREATININE 1.49* 10/31/2014   CALCIUM 9.3 10/31/2014   PROT 7.1 10/30/2014   ALBUMIN 3.5 10/30/2014   AST 13* 10/30/2014   ALT <5* 10/30/2014   ALKPHOS 78 10/30/2014   BILITOT 0.4 10/30/2014   GFRNONAA 33* 10/31/2014   GFRAA 38* 10/31/2014    Lab Results  Component Value Date   WBC 3.0*  10/31/2014   NEUTROABS 2.1 10/30/2014   HGB 7.5* 10/31/2014   HCT 22.5* 10/31/2014   MCV 91.4 10/31/2014   PLT 106* 10/31/2014     STUDIES: Ct Abdomen Pelvis Wo Contrast  10/31/2014   CLINICAL DATA:  Anemia, weakness. 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  COMPARISON:  02/27/2011  FINDINGS: Lower chest: Pacer leads partly visualized. Mild cardiomegaly. Evidence of mitral valvuloplasty. Descending thoracic aortic ectasia is noted measuring 2.8 cm maximally at the diaphragmatic hiatus image 21.  Hepatobiliary: 3 mm too small to characterize hypodensity at the dome of the lateral segment left hepatic lobe identified image 12. Portal venous distention is noted subjectively. No intrahepatic ductal dilatation. Dependent gallstones are noted within the otherwise normal-appearing gallbladder, image 26.  Pancreas: Normal  Spleen: Normal  Adrenals/Urinary Tract: Mild fullness of the adrenal glands noted bilaterally without measurable mass. Mild nonspecific perinephric stranding is identified. No hydroureteronephrosis. No radiopaque renal or ureteral calculi. The bladder is physiologically distended but otherwise unremarkable.  Stomach/Bowel: Moderate stool burden. Stomach is grossly normal. No bowel wall thickening or focal segmental dilatation is identified. The appendix is normal, image 65.  Vascular/Lymphatic: Severe atheromatous aortic calcification without abdominal  aortic aneurysm. No lymphadenopathy.  Reproductive: Uterus and ovaries not visualized.  Other: Small pelvic free fluid is noted. Presumed injection sites noted within the anterior abdominal wall.  Musculoskeletal: Extensive lumbar spine disc degenerative change is noted, most prominent at L4-S1.  IMPRESSION: No acute intra-abdominal pathology.   Electronically Signed   By: Conchita Paris M.D.   On: 10/31/2014 11:35   Dg Chest Portable 1 View  10/30/2014   CLINICAL DATA:  Weakness and lightheaded since yesterday. Mild shortness of breath.  EXAM: PORTABLE CHEST - 1 VIEW  COMPARISON:  06/18/2014  FINDINGS: Sternotomy wires and left-sided pacemaker unchanged. There is a right-sided PICC line with tip overlying the expected region of the SVC. Lungs are adequately inflated with mild stable prominence of the bronchovascular markings over the right base and infrahilar regions. No focal consolidation or effusion. Mild stable cardiomegaly. There is calcified plaque over the aortic arch. Remainder of the exam is unchanged.  IMPRESSION: No acute cardiopulmonary disease.  Chronic stable prominence of the bronchovascular markings in the right base and infrahilar regions.  Stable cardiomegaly.   Electronically Signed   By: Marin Olp M.D.   On: 10/30/2014 18:17    ASSESSMENT: AML, hyperthermia.  PLAN:    1. AML: Patient received her initial treatment at [facility] which included a greater than 3 week hospital stay with multiple complications. She most recently received salvage chemotherapy at [facility] using seven-day course of Vidaza. Her next treatment is not for 3 or 4 weeks. She is considering discontinuing chemotherapy altogether, but has not yet made a decision. 2. Infectious disease: Blood and urine cultures negative to date. Agree with current antibody. Patient's temperature is now within normal limits.  Appreciate consult, will follow.  No matching staging information was found for the patient.  Lloyd Huger, MD   10/31/2014 5:49 PM

## 2014-10-31 NOTE — Progress Notes (Signed)
Initial Nutrition Assessment  DOCUMENTATION CODES:     INTERVENTION: Meals and Snacks: Cater to patient preferences; will send plastic utensils secondary to chemotherapy Medical Food Supplement Therapy: will send Sugar Free Mighty Shakes TID and Magic Cup BID for added nutrition   NUTRITION DIAGNOSIS:  Inadequate oral intake related to acute illness as evidenced by meal completion < 25%.  GOAL:  Patient will meet greater than or equal to 90% of their needs  MONITOR:   (Energy Intake, Electrolyte and renal Profile, Glucose Profile, Anemia Profile, Anthropometrics)  REASON FOR ASSESSMENT:  Malnutrition Screening Tool   ASSESSMENT:  Pt admitted with dizziness and SIRS. Pt with a h/o acute myelogenous leukemia currently undergoing chemotherapy per MD note. PMHx:  Past Medical History  Diagnosis Date  . Atrial fibrillation   . Parkinson's disease   . Glaucoma   . Hypertension   . Hypothyroidism   . GERD (gastroesophageal reflux disease)   . High cholesterol   . Diabetes mellitus without complication   . CAD (coronary artery disease) of bypass graft   . History of appendectomy   . H/O: hysterectomy   . Leukemia    PO Intake: pt ate 0% of lunch today. Orange juice on tray had been consumed. Pt reports not eating breakfast this am. Pt somewhat vague on visit, difficult to assess po intake PTA and/or appetite. Pt did report ensure supplements give her diarrhea.  Medications: NS at 45mL/hr, Glucotrol, vitamin B12, ferrous sulfate, protonix, vitamin D Labs: Electrolyte and Renal Profile:    Recent Labs Lab 10/29/14 1418 10/30/14 1715 10/31/14 0443  BUN 40* 32* 27*  CREATININE 2.32* 1.75* 1.49*  NA 130* 134* 139  K 3.9 3.8 3.8  MG  --  1.8  --    Glucose Profile: No results for input(s): GLUCAP in the last 72 hours. Protein Profile:  Recent Labs Lab 10/29/14 1418 10/30/14 1715  ALBUMIN 3.5 3.5   Unable to assess weight trend PTA. Per [facility] pt weight 115lbs at  Cancer center on 5/10.  Height:  Ht Readings from Last 1 Encounters:  10/30/14 5\' 2"  (1.575 m)    Weight:  Wt Readings from Last 1 Encounters:  10/30/14 117 lb (53.071 kg)    Wt Readings from Last 10 Encounters:  10/30/14 117 lb (53.071 kg)  10/16/14 115 lb 1.3 oz (52.2 kg)    BMI:  Body mass index is 21.39 kg/(m^2).    Unable to complete Nutrition-Focused physical exam at this time.    Estimated Nutritional Needs:  Kcal:  1278-1510kcals, BEE: 967kcals, TEE: (IF 1.1-1.3)(AF 1.2)  Protein:  53-64g protein (1.0-1.2g/kg)  Fluid:  1325-1523mL of fluid (25-24mL/kg)  Skin:  Reviewed, no issues  Diet Order:  Diet heart healthy/carb modified Room service appropriate?: Yes; Fluid consistency:: Thin  EDUCATION NEEDS:  No education needs identified at this time   Intake/Output Summary (Last 24 hours) at 10/31/14 1537 Last data filed at 10/31/14 1334  Gross per 24 hour  Intake      0 ml  Output   1900 ml  Net  -1900 ml    Last BM:  5/24   HIGH Care Level  Dwyane Luo, RD, LDN Pager [phone]

## 2014-10-31 NOTE — Plan of Care (Signed)
Problem: Discharge Progression Outcomes Goal: Discharge plan in place and appropriate Individualization of Care Outcome: Progressing 1. Pt likes to be called [place] 2. Lives at home with husband 3. Pt has leukemia and had chemo 5/24 4. Hx of a-fib, parkinson's disease, glaucoma, HTN, hypothyroidism, GERD, DM, CAD, high cholesterol and ALS (dx in February) controlled by homes.    Goal: Other Discharge Outcomes/Goals Outcome: Progressing Plan of care progress to goal for: Pain-no c/o pain this shift Hemodynamically-VSS Complications-no c/o this shift Diet-pt tolerating diet at this time Activity-pt up to BR with 1 assist

## 2014-10-31 NOTE — Care Management (Signed)
Met briefly with patient to introduce RNCM and explain my role. She did not feel well pending pRBC per RN. She is from home with her husband where she has been independent with daily needs but has become progressively weaker. Per MD PT is not appropripriate at this time but will apply when needed. Patient states she would like to return home at discharge with her husband. She states she has used Home health in the past but does not recall name of agency. RNCM will continue to follow.

## 2014-10-31 NOTE — Consult Note (Signed)
Palliative Medicine Inpatient Consult Note   Name: [name] Date: 10/31/2014 MRN: [id number]  DOB: 08-Dec-1935  Referring Physician: Adin Hector, MD  Palliative Care consult requested for this 79 y.o. female for goals of medical therapy in patient with goals of medical therapy in pt with AML, CM, CKD, admitted with weakness/dizziness  [name] is a 79 yo woman with PMH of AML (dx 07/2014) on chemo, port infection ([facility]), CM with EF 30-35% (ECHO 10/19/2013), RHD s/p MVR with mechanical valve, chronic anemia, CKD III, Parkinsons, HTN, hyperlipidemia, DM, PPM for SSS, a.fib, DM, hypothyroidism, DVT, GERD, s/p appendectomy & hysterectomy. She was admitted 10/30/14 with weakness and dizziness. Work-up significant for hypothermia and pancytopenia. Pt received 6th chemo of 7 day course the day of admission. At present, pt is lying in bed. Alert, oriented. Says that she feels better. Family not present.    REVIEW OF SYSTEMS:  Pain: None Dyspnea:  No Nausea/Vomiting:  No Diarrhea:  No Constipation:   No Depression:   No Anxiety:   No Fatigue:   Yes  SPIRITUAL SUPPORT SYSTEM: Yes.  SOCIAL HISTORY: Pt lives at home with her husband of 84 yrs. She has a son by her 1st marriage. 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   reports that she has quit smoking. Her smoking use included Cigarettes. She has a 10 pack-year smoking history. She does not have any smokeless tobacco history on file. She reports that she does not drink alcohol or use illicit drugs.  LEGAL DOCUMENTS:  Advance Directives:  Yes. Document dated 05/23/2002 in SCM  CODE STATUS: DNR  PAST MEDICAL HISTORY: Past Medical History  Diagnosis Date  . Atrial fibrillation   . Parkinson's disease   . Glaucoma   . Hypertension   . Hypothyroidism   . GERD (gastroesophageal reflux disease)   . High cholesterol   . Diabetes mellitus without complication   . CAD (coronary  artery disease) of bypass graft   . History of appendectomy   . H/O: hysterectomy   . Leukemia     PAST SURGICAL HISTORY:  Past Surgical History  Procedure Laterality Date  . Cardiac valve replacement    . Pacemaker insertion      ALLERGIES:  is allergic to hydrochlorothiazide and lisinopril.  MEDICATIONS:  Current Facility-Administered Medications  Medication Dose Route Frequency Provider Last Rate Last Dose  . 0.9 %  sodium chloride infusion   Intravenous Continuous Henreitta Leber, MD 75 mL/hr at 10/30/14 2239    . 0.9 %  sodium chloride infusion   Intravenous Once Tama High III, MD      . acetaminophen (TYLENOL) tablet 650 mg  650 mg Oral Q6H PRN Henreitta Leber, MD       Or  . acetaminophen (TYLENOL) suppository 650 mg  650 mg Rectal Q6H PRN Henreitta Leber, MD      . acetaminophen (TYLENOL) tablet 650 mg  650 mg Oral Once Tama High III, MD      . allopurinol (ZYLOPRIM) tablet 300 mg  300 mg Oral Daily Henreitta Leber, MD   300 mg at 10/31/14 0951  . brimonidine (ALPHAGAN) 0.15 % ophthalmic solution 1 drop  1 drop Both Eyes BID Henreitta Leber, MD   1 drop at 10/30/14 2227  . calcium-vitamin D (OSCAL WITH D) 500-200 MG-UNIT per tablet 1 tablet  1 tablet Oral Daily Henreitta Leber, MD  1 tablet at 10/31/14 0951  . carbidopa-levodopa (SINEMET IR) 25-100 MG per tablet immediate release 1 tablet  1 tablet Oral TID Henreitta Leber, MD   1 tablet at 10/31/14 0951  . ceFEPIme (MAXIPIME) 2 g in dextrose 5 % 50 mL IVPB  2 g Intravenous Q24H Henreitta Leber, MD   2 g at 10/30/14 2239  . cholecalciferol (VITAMIN D) tablet 1,000 Units  1,000 Units Oral Daily Henreitta Leber, MD   1,000 Units at 10/31/14 [phone]  . enoxaparin (LOVENOX) injection 50 mg  50 mg Subcutaneous Daily Henreitta Leber, MD   50 mg at 10/30/14 2238  . ferrous sulfate tablet 325 mg  325 mg Oral Q breakfast Henreitta Leber, MD   325 mg at 10/31/14 0745  . glipiZIDE (GLUCOTROL XL) 24 hr tablet 5 mg  5 mg Oral Q  breakfast Henreitta Leber, MD   5 mg at 10/31/14 0745  . latanoprost (XALATAN) 0.005 % ophthalmic solution 1 drop  1 drop Both Eyes QHS Henreitta Leber, MD   1 drop at 10/30/14 2239  . levothyroxine (SYNTHROID, LEVOTHROID) tablet 100 mcg  100 mcg Oral QAC breakfast Henreitta Leber, MD   100 mcg at 10/31/14 0745  . losartan (COZAAR) tablet 100 mg  100 mg Oral Daily Henreitta Leber, MD   100 mg at 10/31/14 0950  . ondansetron (ZOFRAN) tablet 4 mg  4 mg Oral Q6H PRN Henreitta Leber, MD       Or  . ondansetron (ZOFRAN) injection 4 mg  4 mg Intravenous Q6H PRN Henreitta Leber, MD      . pantoprazole (PROTONIX) EC tablet 40 mg  40 mg Oral Daily Henreitta Leber, MD   40 mg at 10/31/14 0951  . simvastatin (ZOCOR) tablet 20 mg  20 mg Oral QHS Henreitta Leber, MD   20 mg at 10/30/14 2238  . sotalol (BETAPACE) tablet 80 mg  80 mg Oral BID Henreitta Leber, MD   80 mg at 10/31/14 0950  . vancomycin (VANCOCIN) IVPB 750 mg/150 ml premix  750 mg Intravenous Q36H Henreitta Leber, MD   750 mg at 10/31/14 0249  . vitamin B-12 (CYANOCOBALAMIN) tablet 500 mcg  500 mcg Oral Daily Henreitta Leber, MD   500 mcg at 10/31/14 5188   Facility-Administered Medications Ordered in Other Encounters  Medication Dose Route Frequency Provider Last Rate Last Dose  . 0.9 %  sodium chloride infusion   Intravenous Continuous Lloyd Huger, MD 999 mL/hr at 10/29/14 1550      Vital Signs: BP 108/55 mmHg  Pulse 62  Temp(Src) 98 F (36.7 C) (Oral)  Resp 20  Ht 5\' 2"  (1.575 m)  Wt 53.071 kg (117 lb)  BMI 21.39 kg/m2  SpO2 100% Filed Weights   10/30/14 1641 10/30/14 2114  Weight: 52.164 kg (115 lb) 53.071 kg (117 lb)    Estimated body mass index is 21.39 kg/(m^2) as calculated from the following:   Height as of this encounter: 5\' 2"  (1.575 m).   Weight as of this encounter: 53.071 kg (117 lb).  PERFORMANCE STATUS (ECOG) : 2 - Symptomatic, <50% confined to bed  PHYSICAL EXAM:  General: chronically  ill-appearing HEENT: OP clear, good dentition Neck: Trachea midline, no LAN Cardiovascular: irregular rate and rhythm, mechanical click Pulmonary/Chest: good air movement, no wheeze Abdominal: Soft, NTTP, + bowel sounds GU: No SP tenderness, No CVA tenderness Extremities: No edema  Neurological: nonfocal Skin:  no rashes Psychiatric: alert and oriented x 3  LABS: CBC:  Recent Labs Lab 10/30/14 1715 10/31/14 0443  WBC 3.1* 3.0*  HGB 8.7* 7.5*  HCT 26.5* 22.5*  PLT 126* 106*   Comprehensive Metabolic Panel:  Recent Labs Lab 10/29/14 1418 10/30/14 1715 10/31/14 0443  NA 130* 134* 139  K 3.9 3.8 3.8  CL 101 99* 110  CO2 23 26 26   GLUCOSE 269* 93 73  BUN 40* 32* 27*  CREATININE 2.32* 1.75* 1.49*  CALCIUM 9.0 9.3 9.3  MG  --  1.8  --   AST 14* 13*  --   ALT <5* <5*  --   ALKPHOS 84 78  --   BILITOT 0.4 0.4  --     IMPRESSION:  Destiny Lenig is a 79 yo woman with PMH of AML (dx 07/2014) on chemo, port infection ([facility]), CM with EF 30-35% (ECHO 10/19/2013), RHD s/p MVR with mechanical valve, chronic anemia, CKD III, Parkinsons, HTN, hyperlipidemia, DM, PPM for SSS, a.fib, DM, hypothyroidism, DVT, GERD, s/p appendectomy & hysterectomy. She was admitted 10/30/14 with weakness and dizziness. Work-up significant for hypothermia and pancytopenia. Pt received 6th chemo of 7 day course the day of admission.   Pt became tearful during my conversation with her when she talked about all of her health issues. However, she denies depressive symptoms and says that she "just gets sad sometimes". We discussed the option of stopping chemo and the involvement of hospice in the home. She says that she will think about this.   Pt has a living will in [facility] and she confirms that she is a DNR. Order entered. Out-of-facility DNR completed and placed in chart.   I spoke with husband by phone. He will be in the hospital this AM and I will meet with him to discuss the above.   PLAN: 1. DNR 2. Meet with  husband   More than 50% of the visit was spent in counseling/coordination of care: YES  Time spent: 75 minutes

## 2014-10-31 NOTE — Consult Note (Signed)
I met with pt's husband. Updated him on pt's medical condition. Husband tells of pt's very difficult time at [facility] with 9 day hospitalization for port infections followed by 1 month in SNF. We discussed the options of continuing chemo vs home hospice if pt decides against further tx. Husband wants to know pt's prognosis with and without chemo. Dr Grayland Ormond to see pt.   Husband knows that pt is DNR and is in agreement. Explained that she will have an out-of-facility DNR to take home at discharge.   Husband expressed appreciation for meeting. All questions answered.

## 2014-10-31 NOTE — Progress Notes (Signed)
Spoke with Dr. Lavetta Nielsen if verify if pt needs to continue cardiac monitoring and pulse oximetry continuously. MD gave telephone order to discontinue.  Clarise Cruz, RN

## 2014-11-01 DIAGNOSIS — Z7189 Other specified counseling: Secondary | ICD-10-CM | POA: Insufficient documentation

## 2014-11-01 DIAGNOSIS — C9200 Acute myeloblastic leukemia, not having achieved remission: Secondary | ICD-10-CM | POA: Insufficient documentation

## 2014-11-01 LAB — TYPE AND SCREEN
ABO/RH(D): O POS
ANTIBODY SCREEN: NEGATIVE
Unit division: 0

## 2014-11-01 LAB — COMPREHENSIVE METABOLIC PANEL
ALK PHOS: 64 U/L (ref 38–126)
ALT: <5 U/L — ABNORMAL LOW (ref 14–54)
AST: 8 U/L — ABNORMAL LOW (ref 15–41)
Albumin: 2.9 g/dL — ABNORMAL LOW (ref 3.5–5.0)
Anion gap: 5 (ref 5–15)
BILIRUBIN TOTAL: 0.6 mg/dL (ref 0.3–1.2)
BUN: 22 mg/dL — ABNORMAL HIGH (ref 6–20)
CALCIUM: 9.6 mg/dL (ref 8.9–10.3)
CO2: 25 mmol/L (ref 22–32)
Chloride: 110 mmol/L (ref 101–111)
Creatinine, Ser: 1.4 mg/dL — ABNORMAL HIGH (ref 0.44–1.00)
GFR calc non Af Amer: 35 mL/min — ABNORMAL LOW (ref >60)
GFR, EST AFRICAN AMERICAN: 41 mL/min — AB (ref >60)
Glucose, Bld: 73 mg/dL (ref 65–99)
Potassium: 3.8 mmol/L (ref 3.5–5.1)
SODIUM: 140 mmol/L (ref 135–145)
TOTAL PROTEIN: 6 g/dL — AB (ref 6.5–8.1)

## 2014-11-01 LAB — CBC
HEMATOCRIT: 27.9 % — AB (ref 35.0–47.0)
Hemoglobin: 9.4 g/dL — ABNORMAL LOW (ref 12.0–16.0)
MCH: 30.9 pg (ref 26.0–34.0)
MCHC: 33.8 g/dL (ref 32.0–36.0)
MCV: 91.3 fL (ref 80.0–100.0)
PLATELETS: 93 10*3/uL — AB (ref 150–440)
RBC: 3.05 MIL/uL — ABNORMAL LOW (ref 3.80–5.20)
RDW: 21.9 % — ABNORMAL HIGH (ref 11.5–14.5)
WBC: 2.6 10*3/uL — ABNORMAL LOW (ref 3.6–11.0)

## 2014-11-01 MED ORDER — LEVOFLOXACIN 500 MG PO TABS
500.0000 mg | ORAL_TABLET | Freq: Once | ORAL | Status: AC
  Administered 2014-11-01: 500 mg via ORAL
  Filled 2014-11-01: qty 1

## 2014-11-01 MED ORDER — DOCUSATE SODIUM 100 MG PO CAPS
100.0000 mg | ORAL_CAPSULE | Freq: Two times a day (BID) | ORAL | Status: DC
  Administered 2014-11-01 – 2014-11-02 (×2): 100 mg via ORAL
  Filled 2014-11-01 (×2): qty 1

## 2014-11-01 MED ORDER — ALTEPLASE 100 MG IV SOLR
2.0000 mg | Freq: Once | INTRAVENOUS | Status: AC
Start: 2014-11-01 — End: 2014-11-02
  Administered 2014-11-02: 2 mg
  Filled 2014-11-01 (×2): qty 2

## 2014-11-01 MED ORDER — ALTEPLASE 100 MG IV SOLR
2.0000 mg | Freq: Once | INTRAVENOUS | Status: AC
  Administered 2014-11-01: 16:00:00 1 mg
  Filled 2014-11-01 (×2): qty 2

## 2014-11-01 MED ORDER — LEVOFLOXACIN 250 MG PO TABS
250.0000 mg | ORAL_TABLET | Freq: Every day | ORAL | Status: DC
  Administered 2014-11-02: 10:00:00 250 mg via ORAL
  Filled 2014-11-01: qty 1

## 2014-11-01 MED ORDER — STERILE WATER FOR INJECTION IJ SOLN
INTRAMUSCULAR | Status: AC
  Filled 2014-11-01: qty 10

## 2014-11-01 MED ORDER — ALTEPLASE 100 MG IV SOLR
2.0000 mg | Freq: Once | INTRAVENOUS | Status: AC
Start: 2014-11-01 — End: 2014-11-01
  Administered 2014-11-01: 1 mg
  Filled 2014-11-01 (×2): qty 2

## 2014-11-01 MED ORDER — LEVOFLOXACIN 500 MG PO TABS: 500.0000 mg | ORAL_TABLET | Freq: Every day | ORAL | Status: DC

## 2014-11-01 NOTE — Progress Notes (Signed)
SUBJECTIVE:  Pt with h/o AML, undergoing chemotherapy through [place], with recent prolonged hospital stays at [facility] due to portacath infections/chest wall cellulitis, with h/o chronic left side systolic CHF, mech aortic valve, afib, multiple prior admissions for acute blood loss anemia, typically with dizziness as presenting symptom.  Has been on lovenox for anticoagulation, as felt too high risk for oral anticoagulants.  Developed dizziness with occ vertigo symptoms, consistent with prior anemia symptoms; also noted to have hypothermia.  S/p 1 unit pRBC, with marked improvement.  Temp has normalized; cx remain negative.  Voices no other c/o currently.  Having vascular access issues with clotting of line  ______________________________________________________________________  ROS: Please see HPI; remainder of complete 10 point ROS is negative   Past Medical History  Diagnosis Date  . Atrial fibrillation   . Parkinson's disease   . Glaucoma   . Hypertension   . Hypothyroidism   . GERD (gastroesophageal reflux disease)   . High cholesterol   . Diabetes mellitus without complication   . CAD (coronary artery disease) of bypass graft   . History of appendectomy   . H/O: hysterectomy   . Leukemia     Past Surgical History  Procedure Laterality Date  . Cardiac valve replacement    . Pacemaker insertion       Current facility-administered medications:  .  acetaminophen (TYLENOL) tablet 650 mg, 650 mg, Oral, Q6H PRN **OR** acetaminophen (TYLENOL) suppository 650 mg, 650 mg, Rectal, Q6H PRN, Henreitta Leber, MD .  allopurinol (ZYLOPRIM) tablet 300 mg, 300 mg, Oral, Daily, Henreitta Leber, MD, 300 mg at 10/31/14 0951 .  alteplase (ACTIVASE) injection 2 mg, 2 mg, Intracatheter, Once, Glendon Axe, MD .  alteplase (ACTIVASE) injection 2 mg, 2 mg, Intracatheter, Once, Glendon Axe, MD .  brimonidine (ALPHAGAN) 0.15 % ophthalmic solution 1 drop, 1 drop, Both Eyes, BID, Henreitta Leber,  MD, 1 drop at 10/31/14 2318 .  calcium-vitamin D (OSCAL WITH D) 500-200 MG-UNIT per tablet 1 tablet, 1 tablet, Oral, Daily, Henreitta Leber, MD, 1 tablet at 10/31/14 [phone] .  carbidopa-levodopa (SINEMET IR) 25-100 MG per tablet immediate release 1 tablet, 1 tablet, Oral, TID, Henreitta Leber, MD, 1 tablet at 10/31/14 2318 .  ceFEPIme (MAXIPIME) 2 g in dextrose 5 % 50 mL IVPB, 2 g, Intravenous, Q24H, Henreitta Leber, MD, 2 g at 10/31/14 2300 .  cholecalciferol (VITAMIN D) tablet 1,000 Units, 1,000 Units, Oral, Daily, Henreitta Leber, MD, 1,000 Units at 10/31/14 [phone] .  enoxaparin (LOVENOX) injection 50 mg, 50 mg, Subcutaneous, Daily, Henreitta Leber, MD, 50 mg at 10/31/14 1216 .  ferrous sulfate tablet 325 mg, 325 mg, Oral, Q breakfast, Henreitta Leber, MD, 325 mg at 10/31/14 0745 .  glipiZIDE (GLUCOTROL XL) 24 hr tablet 5 mg, 5 mg, Oral, Q breakfast, Henreitta Leber, MD, 5 mg at 10/31/14 0745 .  latanoprost (XALATAN) 0.005 % ophthalmic solution 1 drop, 1 drop, Both Eyes, QHS, Henreitta Leber, MD, 1 drop at 10/31/14 2318 .  levothyroxine (SYNTHROID, LEVOTHROID) tablet 100 mcg, 100 mcg, Oral, QAC breakfast, Henreitta Leber, MD, 100 mcg at 10/31/14 0745 .  losartan (COZAAR) tablet 100 mg, 100 mg, Oral, Daily, Henreitta Leber, MD, 100 mg at 10/31/14 0950 .  ondansetron (ZOFRAN) tablet 4 mg, 4 mg, Oral, Q6H PRN **OR** ondansetron (ZOFRAN) injection 4 mg, 4 mg, Intravenous, Q6H PRN, Henreitta Leber, MD .  pantoprazole (PROTONIX) EC tablet 40 mg, 40 mg, Oral, Daily,  Henreitta Leber, MD, 40 mg at 10/31/14 0951 .  simvastatin (ZOCOR) tablet 20 mg, 20 mg, Oral, QHS, Henreitta Leber, MD, 20 mg at 10/31/14 2318 .  sotalol (BETAPACE) tablet 80 mg, 80 mg, Oral, BID, Henreitta Leber, MD, 80 mg at 10/31/14 2318 .  vancomycin (VANCOCIN) IVPB 750 mg/150 ml premix, 750 mg, Intravenous, Q36H, Henreitta Leber, MD, 750 mg at 10/31/14 0249 .  vitamin B-12 (CYANOCOBALAMIN) tablet 500 mcg, 500 mcg, Oral, Daily, Henreitta Leber, MD, 500 mcg at 10/31/14 1117  Facility-Administered Medications Ordered in Other Encounters:  .  0.9 %  sodium chloride infusion, , Intravenous, Continuous, Lloyd Huger, MD, Last Rate: 999 mL/hr at 10/29/14 1550  PHYSICAL EXAM:  BP 127/51 mmHg  Pulse 70  Temp(Src) 98.5 F (36.9 C) (Oral)  Resp 18  Ht 5\' 2"  (1.575 m)  Wt 53.071 kg (117 lb)  BMI 21.39 kg/m2  SpO2 99%  General: pleasant  female, in NAD HEENT: conjunctiva pale; OP moist without lesions. Neck: supple, trachea midline, no thyromegaly Chest: hyperpigmentation from prior inflammation; no erythema or tenderness Lungs: clear bilaterally without retractions or wheezes Cardiovascular: irr irr with mech heart valve, 3/6 SEM Abdomen: soft, nontender, nondistended, positive bowel sounds Extremities: no clubbing, cyanosis, edema Neuro: alert, moves all extremities Derm: no significant rashes or nodules Lymph: no cervical or supraclavicular lymphadenopathy  Labs and images were reviewed  ASSESSMENT/PLAN:   1. Pancytopenia- improved symptoms after transfusion; unclear if counts falling due to AML or most recent treatment.  No bleed on CT.  Following counts; await guidance from oncology as to any further workup, but given her poor performance status, multiple challenges with chemotherapy recently, and other comorbidities, as well as her reluctance to go through much more medical procedure, Hospice is more and more appropriate as a next step.  Follow CBC. 2. HTN- reasonably stable 3. CKD stage 3- consistent with prior labs; follow periodically 4. Afib/chronic left side systolic CHF- cont sotalol; no other BP meds due to hypotension 5. Parkinson's- cont home meds. 6. mech heart valve- continue anticoagulation with lovenox

## 2014-11-01 NOTE — Plan of Care (Signed)
Problem: Discharge Progression Outcomes Goal: Discharge plan in place and appropriate Individualization of Care  Outcome: Progressing 1. Pt likes to be called [name] 2. Lives at home with husband 3. Pt has leukemia and had chemo 5/24 4. Hx of a-fib, parkinson's disease, glaucoma, HTN, hypothyroidism, GERD, DM, CAD, high cholesterol and ALS (dx in February) controlled by homes.        Goal: Other Discharge Outcomes/Goals Outcome: Progressing Plan of care progress to goal for: Pain-no c/o pain this shift Hemodynamically-VSS, pt received 1 unit pRBC Complications-no c/o this shift  Diet-pt tolerating diet at this time Activity-pt up to [facility] with 1 assist

## 2014-11-01 NOTE — Progress Notes (Signed)
ANTIBIOTIC CONSULT NOTE - INITIAL  Pharmacy Consult for Levaquin Indication: unknown indication   Allergies  Allergen Reactions  . Hydrochlorothiazide Other (See Comments)    Reaction:  Unknown   . Lisinopril Cough    Patient Measurements: Height: 5\' 2"  (157.5 cm) Weight: 117 lb (53.071 kg) IBW/kg (Calculated) : 50.1   Vital Signs: BP: 123/85 mmHg (05/26 1209) Pulse Rate: 72 (05/26 1228) Intake/Output from previous day: 05/25 0701 - 05/26 0700 In: 420 [P.O.:120; Blood:300] Out: 1500 [Urine:1500] Intake/Output from this shift: Total I/O In: -  Out: 300 [Urine:300]  Labs:  Recent Labs  10/30/14 1715 10/31/14 0443 11/01/14 0419  WBC 3.1* 3.0* 2.6*  HGB 8.7* 7.5* 9.4*  PLT 126* 106* 93*  CREATININE 1.75* 1.49* 1.40*   Estimated Creatinine Clearance: 26.6 mL/min (by C-G formula based on Cr of 1.4). No results for input(s): VANCOTROUGH, VANCOPEAK, VANCORANDOM, GENTTROUGH, GENTPEAK, GENTRANDOM, TOBRATROUGH, TOBRAPEAK, TOBRARND, AMIKACINPEAK, AMIKACINTROU, AMIKACIN in the last 72 hours.   Microbiology: Recent Results (from the past 720 hour(s))  Urine culture     Status: None (Preliminary result)   Collection Time: 10/30/14  5:27 PM  Result Value Ref Range Status   Specimen Description URINE, RANDOM  Final   Special Requests NONE  Final   Culture   Final    >=100,000 COLONIES/mL PROTEUS MIRABILIS SUSCEPTIBILITIES TO FOLLOW ISOLATING ADDITIONAL POSSIBLE ORGANISM.    Report Status PENDING  Incomplete  Blood culture (routine x 2)     Status: None (Preliminary result)   Collection Time: 10/30/14  6:03 PM  Result Value Ref Range Status   Specimen Description BLOOD  Final   Special Requests NONE  Final   Culture NO GROWTH 2 DAYS  Final   Report Status PENDING  Incomplete  Blood culture (routine x 2)     Status: None (Preliminary result)   Collection Time: 10/30/14  6:03 PM  Result Value Ref Range Status   Specimen Description BLOOD  Final   Special Requests NONE   Final   Culture NO GROWTH 2 DAYS  Final   Report Status PENDING  Incomplete    Medical History: Past Medical History  Diagnosis Date  . Atrial fibrillation   . Parkinson's disease   . Glaucoma   . Hypertension   . Hypothyroidism   . GERD (gastroesophageal reflux disease)   . High cholesterol   . Diabetes mellitus without complication   . CAD (coronary artery disease) of bypass graft   . History of appendectomy   . H/O: hysterectomy   . Leukemia     Medications:  Scheduled:  . allopurinol  300 mg Oral Daily  . alteplase  2 mg Intracatheter Once  . brimonidine  1 drop Both Eyes BID  . calcium-vitamin D  1 tablet Oral Daily  . carbidopa-levodopa  1 tablet Oral TID  . cholecalciferol  1,000 Units Oral Daily  . enoxaparin  50 mg Subcutaneous Daily  . ferrous sulfate  325 mg Oral Q breakfast  . glipiZIDE  5 mg Oral Q breakfast  . latanoprost  1 drop Both Eyes QHS  . [START ON 11/02/2014] levofloxacin  250 mg Oral Daily  . levofloxacin  500 mg Oral Once  . levothyroxine  100 mcg Oral QAC breakfast  . losartan  100 mg Oral Daily  . pantoprazole  40 mg Oral Daily  . simvastatin  20 mg Oral QHS  . sotalol  80 mg Oral BID  . sterile water (preservative free)      .  vitamin B-12  500 mcg Oral Daily   Infusions:   Assessment: Patient with AML on chemotherapy and pancytopenia previously on vancomycin and cefepime for r/o sepsis now ordered Levaquin.   Plan:  Levaquin 500 mg daily ordered. Will change to Levaquin 500 mg oral once then 250 mg daily.   Ulice Dash D 11/01/2014,5:20 PM

## 2014-11-01 NOTE — Progress Notes (Signed)
Physical Therapy Evaluation Patient Details Name: [name] MRN: [id number] DOB: 16-Dec-1935 Today's Date: 11/01/2014   History of Present Illness  Pt is admitted for SIRS. Pt with history of Afib, CHF, CAD, HTN, Parkingson's dx, and now is currently on chemo for AML.  Clinical Impression  Pt is a pleasant 79  year old female who was admitted for SIRs. Pt performs bed mobility, transfers, and ambulation with modified independence using SPC. Pt with no balance deficits at this time and appears to be at baseline level. Pt demonstrates no skilled needs at this time. Will dc currently orders. Safe to dc home.     Follow Up Recommendations No PT follow up    Equipment Recommendations       Recommendations for Other Services       Precautions / Restrictions Precautions Precautions: None Restrictions Weight Bearing Restrictions: No      Mobility  Bed Mobility Overal bed mobility: Independent             General bed mobility comments: safe technique performed.  Transfers Overall transfer level: Modified independent Equipment used: Straight cane             General transfer comment: sit<>stand with SPC and independence. Safe technique performed.  Ambulation/Gait Ambulation/Gait assistance: Modified independent (Device/Increase time) Ambulation Distance (Feet): 225 Feet Assistive device: Straight cane Gait Pattern/deviations: Step-through pattern     General Gait Details: ambulated with reciprocal gait pattern and safe technique. No LOB noted and good cadence performed.  Stairs            Wheelchair Mobility    Modified Rankin (Stroke Patients Only)       Balance Overall balance assessment: Modified Independent                                           Pertinent Vitals/Pain Pain Assessment: No/denies pain    Home Living Family/patient expects to be discharged to:: Private residence Living Arrangements: Spouse/significant  other Available Help at Discharge: Family Type of Home: House Home Access: [place]: One [employer]: Environmental consultant - 2 wheels;Cane - single point      Prior Function Level of Independence: Independent with assistive device(s)         Comments: uses SPC     Hand Dominance        Extremity/Trunk Assessment   Upper Extremity Assessment: Generalized weakness (grossly 4/5)           Lower Extremity Assessment: Overall WFL for tasks assessed         Communication   Communication: No difficulties  Cognition Arousal/Alertness: Awake/alert Behavior During Therapy: WFL for tasks assessed/performed Overall Cognitive Status: Within Functional Limits for tasks assessed                      General Comments      Exercises        Assessment/Plan    PT Assessment Patent does not need any further PT services  PT Diagnosis     PT Problem List    PT Treatment Interventions     PT Goals (Current goals can be found in the Care Plan section) Acute Rehab PT Goals Patient Stated Goal: to go home PT Goal Formulation: With patient Time For Goal Achievement: 11/01/14 Potential to Achieve Goals: Good  Frequency     Barriers to discharge        Co-evaluation               End of Session Equipment Utilized During Treatment: Gait belt Activity Tolerance: Patient tolerated treatment well Patient left: in bed;with bed alarm set Nurse Communication: Mobility status         Time: 1110-1130 PT Time Calculation (min) (ACUTE ONLY): 20 min   Charges:   PT Evaluation $Initial PT Evaluation Tier I: 1 Procedure     PT G CodesGreggory Stallion, PT, DPT [phone]  Malyah Ohlrich 11/01/2014, 1:10 PM

## 2014-11-01 NOTE — Consult Note (Signed)
Palliative Medicine Inpatient Consult Follow Up Note   Name: [name] Date: 11/01/2014 MRN: [id number]  DOB: [birth year]  Referring Physician: Adin Hector, MD  Palliative Care consult requested for this 79 y.o. female for goals of medical therapy in patient with goals of medical therapy in pt with AML, CM, CKD, admitted with weakness/dizziness  [name] is lying in bed, awake, oriented. Denies pain or other symptoms. Family not present.   REVIEW OF SYSTEMS:  Pain: None Dyspnea:  No Nausea/Vomiting:  No Diarrhea:  No Constipation:   No Depression:   No Anxiety:   No Fatigue:   Yes  CODE STATUS: DNR   PAST MEDICAL HISTORY: Past Medical History  Diagnosis Date  . Atrial fibrillation   . Parkinson's disease   . Glaucoma   . Hypertension   . Hypothyroidism   . GERD (gastroesophageal reflux disease)   . High cholesterol   . Diabetes mellitus without complication   . CAD (coronary artery disease) of bypass graft   . History of appendectomy   . H/O: hysterectomy   . Leukemia     PAST SURGICAL HISTORY:  Past Surgical History  Procedure Laterality Date  . Cardiac valve replacement    . Pacemaker insertion      Vital Signs: BP 127/51 mmHg  Pulse 70  Temp(Src) 98.5 F (36.9 C) (Oral)  Resp 18  Ht 5\' 2"  (1.575 m)  Wt 53.071 kg (117 lb)  BMI 21.39 kg/m2  SpO2 99% Filed Weights   10/30/14 1641 10/30/14 2114  Weight: 52.164 kg (115 lb) 53.071 kg (117 lb)    Estimated body mass index is 21.39 kg/(m^2) as calculated from the following:   Height as of this encounter: 5\' 2"  (1.575 m).   Weight as of this encounter: 53.071 kg (117 lb).  PHYSICAL EXAM: General: chronically ill-appearing HEENT: OP clear, good dentition Neck: Trachea midline, no LAN Cardiovascular: irregular rate and rhythm, mechanical click Pulmonary/Chest: good air movement, no wheeze Abdominal: Soft, NTTP, + bowel sounds GU: No SP tenderness Extremities: No edema  Neurological:  nonfocal Skin: hyperpigmented scars B.upper chest Psychiatric: alert and oriented x 3  LABS: CBC:    Component Value Date/Time   WBC 2.6* 11/01/2014 0419   WBC 3.9 06/28/2014 1242   HGB 9.4* 11/01/2014 0419   HGB 9.1* 06/28/2014 1242   HCT 27.9* 11/01/2014 0419   HCT 28.3* 06/28/2014 1242   PLT 93* 11/01/2014 0419   PLT 212 06/28/2014 1242   MCV 91.3 11/01/2014 0419   MCV 89 06/28/2014 1242   NEUTROABS 2.1 10/30/2014 1715   NEUTROABS 2.3 06/28/2014 1242   LYMPHSABS 0.8* 10/30/2014 1715   LYMPHSABS 1.0 06/28/2014 1242   MONOABS 0.1* 10/30/2014 1715   MONOABS 0.1* 06/28/2014 1242   EOSABS 0.1 10/30/2014 1715   EOSABS 0.4 06/28/2014 1242   BASOSABS 0.0 10/30/2014 1715   BASOSABS 0.1 06/28/2014 1242   Comprehensive Metabolic Panel:    Component Value Date/Time   NA 140 11/01/2014 0419   NA 140 06/18/2014 0515   K 3.8 11/01/2014 0419   K 4.1 06/18/2014 0515   CL 110 11/01/2014 0419   CL 107 06/18/2014 0515   CO2 25 11/01/2014 0419   CO2 25 06/18/2014 0515   BUN 22* 11/01/2014 0419   BUN 13 06/18/2014 0515   CREATININE 1.40* 11/01/2014 0419   CREATININE 1.06 06/18/2014 0515   GLUCOSE 73 11/01/2014 0419   GLUCOSE 93 06/18/2014 0515   CALCIUM 9.6 11/01/2014 0419  CALCIUM 9.1 06/18/2014 0515   AST 8* 11/01/2014 0419   AST 23 09/19/2013 0533   ALT <5* 11/01/2014 0419   ALT 33 09/19/2013 0533   ALKPHOS 64 11/01/2014 0419   ALKPHOS 81 09/19/2013 0533   BILITOT 0.6 11/01/2014 0419   PROT 6.0* 11/01/2014 0419   PROT 6.3* 09/19/2013 0533   ALBUMIN 2.9* 11/01/2014 0419   ALBUMIN 2.6* 09/19/2013 0533    IMPRESSION: [name] is a 79 yo woman with PMH of AML (dx 07/2014) on chemo, port infection ([facility]), CM with EF 30-35% (ECHO 10/19/2013), RHD s/p MVR with mechanical valve, chronic anemia, CKD III, Parkinsons, HTN, hyperlipidemia, DM, PPM for SSS, a.fib, DM, hypothyroidism, DVT, GERD, s/p appendectomy & hysterectomy. She was admitted 10/30/14 with weakness and dizziness.  Work-up significant for hypothermia and pancytopenia. Pt received 6th chemo of 7 day course the day of admission.   Pt says that she is feeling better today. She is uncertain about continuing tx for AML. Agrees to meeting with husband when he gets to hospital this AM. I will follow up.   PLAN: Family meeting  REFERRALS TO BE ORDERED:  Chaplain   More than 50% of the visit was spent in counseling/coordination of care: YES  Time spent: 35 minutes

## 2014-11-01 NOTE — Consult Note (Signed)
Using 10cc syringe, unable to get blood return from catheter. Will reassess at 1800.

## 2014-11-01 NOTE — Progress Notes (Signed)
Spoke with Dr. Candiss Norse about both lumens on pt's picc being occluded.  MD gave order for both lumen to receive tPA.

## 2014-11-01 NOTE — Progress Notes (Signed)
Pt on ly received 2 mg of 4 mg tPA to de-clot double lumen picc.Spoke with Dr. Ginette Pitman to make sure pt ok to receive additional 2 mg.  MD stated that was ok to give pt.

## 2014-11-01 NOTE — Plan of Care (Signed)
Problem: Discharge Progression Outcomes Goal: Other Discharge Outcomes/Goals Outcome: Progressing Patient doing well today.  VSS, attempted to declot PICC both lumens are occluded.  Attempted IV access and was unsuccessful.  [name] aware, discontinued IV antibiotics and started on po levaquin daily.  Please call for any changes especially increase in temp.  Patient seen by palliative care today with no changes made.  Patient remains Alert and oriented and pleasant, no discharge plans at this time but comes from home with caresouth.

## 2014-11-01 NOTE — Consult Note (Signed)
I met with pt and her husband. Discussed pt's wishes for continued tx of AML. Pt has a BM bx scheduled at [facility] on 11/12/14. Pt wants to have the BM bx to see if tx has been effective. She will then decide whether she wants to continue tx. Pt and husband understand that pt qualifies for hospice services if she decides to stop tx.

## 2014-11-01 NOTE — Consult Note (Signed)
Unable to flush or get blood return from PICC line using 10cc syringe after 2nd dose of TPA had dwelled for 120 minutes.

## 2014-11-02 LAB — BASIC METABOLIC PANEL
Anion gap: 5 (ref 5–15)
BUN: 19 mg/dL (ref 6–20)
CO2: 27 mmol/L (ref 22–32)
CREATININE: 1.32 mg/dL — AB (ref 0.44–1.00)
Calcium: 9.8 mg/dL (ref 8.9–10.3)
Chloride: 106 mmol/L (ref 101–111)
GFR, EST AFRICAN AMERICAN: 44 mL/min — AB (ref >60)
GFR, EST NON AFRICAN AMERICAN: 38 mL/min — AB (ref >60)
Glucose, Bld: 87 mg/dL (ref 65–99)
Potassium: 3.8 mmol/L (ref 3.5–5.1)
Sodium: 138 mmol/L (ref 135–145)

## 2014-11-02 LAB — CBC
HCT: 28.6 % — ABNORMAL LOW (ref 35.0–47.0)
Hemoglobin: 9.5 g/dL — ABNORMAL LOW (ref 12.0–16.0)
MCH: 30.3 pg (ref 26.0–34.0)
MCHC: 33.4 g/dL (ref 32.0–36.0)
MCV: 90.8 fL (ref 80.0–100.0)
Platelets: 89 10*3/uL — ABNORMAL LOW (ref 150–440)
RBC: 3.15 MIL/uL — ABNORMAL LOW (ref 3.80–5.20)
RDW: 21.8 % — ABNORMAL HIGH (ref 11.5–14.5)
WBC: 2.6 10*3/uL — AB (ref 3.6–11.0)

## 2014-11-02 MED ORDER — DOCUSATE SODIUM 100 MG PO CAPS: 100.0000 mg | ORAL_CAPSULE | Freq: Two times a day (BID) | ORAL | Status: DC

## 2014-11-02 MED ORDER — ENOXAPARIN SODIUM 60 MG/0.6ML ~~LOC~~ SOLN
1.0000 mg/kg | SUBCUTANEOUS | Status: DC
  Administered 2014-11-02: 55 mg via SUBCUTANEOUS
  Filled 2014-11-02: qty 0.6

## 2014-11-02 MED ORDER — LEVOFLOXACIN 250 MG PO TABS: 250.0000 mg | ORAL_TABLET | Freq: Every day | ORAL | Status: DC

## 2014-11-02 MED ORDER — STERILE WATER FOR INJECTION IJ SOLN
INTRAMUSCULAR | Status: AC
  Administered 2014-11-02: 02:00:00
  Filled 2014-11-02: qty 10

## 2014-11-02 NOTE — Plan of Care (Signed)
Problem: Discharge Progression Outcomes Goal: Discharge plan in place and appropriate Individualization of Care  Outcome: Progressing Individualization of Care   Outcome: Progressing 1. Pt likes to be called [name] 2. Lives at home with husband 3. Pt has leukemia (AML) and had chemo 5/24 4. Hx of a-fib, parkinson's disease, glaucoma, HTN, hypothyroidism, GERD, DM, CAD, and high cholesterol controlled by homes.             Goal: Other Discharge Outcomes/Goals Outcome: Progressing Plan of care progress to goal for: Pain-no c/o pain this shift Hemodynamically-VSS, pt received 1 unit pRBC Complications-no c/o this shift   Diet-pt tolerating diet at this time Activity-pt up to [facility] with 1 assist

## 2014-11-02 NOTE — Progress Notes (Signed)
[facility]  [phone] [phone]  ID: MCKENSIE SCOTTI OB: May 11, 1936  MR#: [id number]  AGT#:[id number]  Patient Care Team: Adin Hector, MD as PCP - General (Internal Medicine)  CHIEF COMPLAINT:  Chief Complaint  Patient presents with  . Follow-up    AML, follow up from [place]: Patient returns to clinic today for further evaluation and reinitiation of treatment for her acute leukemia. Patient's induction therapy at [facility] had multiple complications and patient was in the hospital for nearly one month. Details of this hospitalization can be seen in patient's care everywhere. She is currently in a nursing home, but states her strength is improving and she is nearly back to her baseline. She otherwise feels well. She denies any fevers. She has no neurologic complaints. She has a fair appetite. She denies any chest pain or shortness of breath. She has no nausea, vomiting, constipation, or diarrhea. Patient otherwise feels well and offers no further specific complaints.  REVIEW OF SYSTEMS:   Review of Systems  Constitutional: Positive for malaise/fatigue. Negative for fever.  Respiratory: Negative.   Cardiovascular: Negative.   Gastrointestinal: Negative.   Neurological: Positive for weakness.    As per HPI. Otherwise, a complete review of systems is negatve.  PAST MEDICAL HISTORY: Past Medical History  Diagnosis Date  . Atrial fibrillation   . Parkinson's disease   . Glaucoma   . Hypertension   . Hypothyroidism   . GERD (gastroesophageal reflux disease)   . High cholesterol   . Diabetes mellitus without complication   . CAD (coronary artery disease) of bypass graft   . History of appendectomy   . H/O: hysterectomy   . Leukemia     PAST SURGICAL HISTORY: Past Surgical History  Procedure Laterality Date  . Cardiac valve replacement    . Pacemaker insertion      FAMILY HISTORY Family History  Problem Relation  Age of Onset  . CAD Other   . Heart disease Father        ADVANCED DIRECTIVES:    HEALTH MAINTENANCE: History  Substance Use Topics  . Smoking status: Former Smoker -- 1.00 packs/day for 10 years    Types: Cigarettes  . Smokeless tobacco: Not on file  . Alcohol Use: No     Colonoscopy:  PAP:  Bone density:  Lipid panel:  Allergies  Allergen Reactions  . Hydrochlorothiazide Other (See Comments)    Reaction:  Unknown   . Lisinopril Cough    Current Outpatient Prescriptions  Medication Sig Dispense Refill  . acetaminophen (TYLENOL) 325 MG tablet Take 650 mg by mouth every 4 (four) hours as needed for mild pain or headache.    . allopurinol (ZYLOPRIM) 300 MG tablet Take 300 mg by mouth daily.    Marland Kitchen amLODipine (NORVASC) 5 MG tablet Take 10 mg by mouth daily.    . brimonidine (ALPHAGAN) 0.15 % ophthalmic solution Place 1 drop into both eyes 2 (two) times daily.    . carbidopa-levodopa (SINEMET IR) 25-100 MG per tablet Take 1 tablet by mouth 3 (three) times daily.    . cholecalciferol (VITAMIN D) 1000 UNITS tablet Take 1,000 Units by mouth daily.    Marland Kitchen docusate sodium (COLACE) 100 MG capsule Take 1 capsule (100 mg total) by mouth 2 (two) times daily. 10 capsule 0  . enoxaparin (LOVENOX) 60 MG/0.6ML injection Inject 60 mg into the skin daily.    . ferrous sulfate 325 (65 FE) MG tablet  Take 325 mg by mouth daily with breakfast.    . furosemide (LASIX) 40 MG tablet Take 40 mg by mouth 2 (two) times daily.    Marland Kitchen glipiZIDE (GLUCOTROL XL) 5 MG 24 hr tablet Take 5 mg by mouth daily.    Marland Kitchen latanoprost (XALATAN) 0.005 % ophthalmic solution Place 1 drop into both eyes at bedtime.    Marland Kitchen levofloxacin (LEVAQUIN) 250 MG tablet Take 1 tablet (250 mg total) by mouth daily. X 7 days 7 tablet 0  . levothyroxine (SYNTHROID, LEVOTHROID) 100 MCG tablet Take 100 mcg by mouth daily before breakfast.    . losartan (COZAAR) 100 MG tablet Take 100 mg by mouth daily.    Marland Kitchen omeprazole (PRILOSEC) 40 MG capsule  Take 40 mg by mouth 2 (two) times daily.    . potassium chloride SA (K-DUR,KLOR-CON) 20 MEQ tablet Take 20 mEq by mouth 2 (two) times daily.    . prochlorperazine (COMPAZINE) 10 MG tablet Take 1 tablet (10 mg total) by mouth every 6 (six) hours as needed (Nausea or vomiting). (Patient not taking: Reported on 10/30/2014) 30 tablet 1  . simvastatin (ZOCOR) 20 MG tablet Take 20 mg by mouth at bedtime.    . sotalol (BETAPACE) 80 MG tablet Take 80 mg by mouth 2 (two) times daily.    . vitamin B-12 (CYANOCOBALAMIN) 250 MCG tablet Take 250 mcg by mouth daily.     No current facility-administered medications for this visit.   Facility-Administered Medications Ordered in Other Visits  Medication Dose Route Frequency Provider Last Rate Last Dose  . 0.9 %  sodium chloride infusion   Intravenous Continuous Lloyd Huger, MD 999 mL/hr at 10/29/14 1550      OBJECTIVE: Filed Vitals:   10/16/14 1220  BP: 95/66  Pulse: 116  Temp: 96.8 F (36 C)  Resp: 20     There is no height on file to calculate BMI.    ECOG FS:1 - Symptomatic but completely ambulatory  General: Well-developed, well-nourished, no acute distress. Eyes: Pink conjunctiva, anicteric sclera. HEENT: Normocephalic, moist mucous membranes, clear oropharnyx. Lungs: Clear to auscultation bilaterally. Heart: Regular rate and rhythm. No rubs, murmurs, or gallops. Abdomen: Soft, nontender, nondistended. No organomegaly noted, normoactive bowel sounds. Musculoskeletal: No edema, cyanosis, or clubbing. Neuro: Alert, answering all questions appropriately. Cranial nerves grossly intact. Skin: No rashes or petechiae noted. Psych: Normal affect. Lymphatics: No cervical, calvicular, axillary or inguinal LAD.   LAB RESULTS:  Lab Results  Component Value Date   NA 138 11/02/2014   K 3.8 11/02/2014   CL 106 11/02/2014   CO2 27 11/02/2014   GLUCOSE 87 11/02/2014   BUN 19 11/02/2014   CREATININE 1.32* 11/02/2014   CALCIUM 9.8 11/02/2014     PROT 6.0* 11/01/2014   ALBUMIN 2.9* 11/01/2014   AST 8* 11/01/2014   ALT <5* 11/01/2014   ALKPHOS 64 11/01/2014   BILITOT 0.6 11/01/2014   GFRNONAA 38* 11/02/2014   GFRAA 44* 11/02/2014    Lab Results  Component Value Date   WBC 2.6* 11/02/2014   NEUTROABS 2.1 10/30/2014   HGB 9.5* 11/02/2014   HCT 28.6* 11/02/2014   MCV 90.8 11/02/2014   PLT 89* 11/02/2014     STUDIES: Ct Abdomen Pelvis Wo Contrast  10/31/2014   CLINICAL DATA:  Anemia, weakness. 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  COMPARISON:  02/27/2011  FINDINGS: Lower chest: Pacer leads partly visualized. Mild cardiomegaly. Evidence of mitral valvuloplasty. Descending thoracic aortic ectasia is noted measuring 2.8 cm maximally at the diaphragmatic hiatus image 21.  Hepatobiliary: 3 mm too small to characterize hypodensity at the dome of the lateral segment left hepatic lobe identified image 12. Portal venous distention is noted subjectively. No intrahepatic ductal dilatation. Dependent gallstones are noted within the otherwise normal-appearing gallbladder, image 26.  Pancreas: Normal  Spleen: Normal  Adrenals/Urinary Tract: Mild fullness of the adrenal glands noted bilaterally without measurable mass. Mild nonspecific perinephric stranding is identified. No hydroureteronephrosis. No radiopaque renal or ureteral calculi. The bladder is physiologically distended but otherwise unremarkable.  Stomach/Bowel: Moderate stool burden. Stomach is grossly normal. No bowel wall thickening or focal segmental dilatation is identified. The appendix is normal, image 65.  Vascular/Lymphatic: Severe atheromatous aortic calcification without abdominal aortic aneurysm. No lymphadenopathy.  Reproductive: Uterus and ovaries not visualized.  Other: Small  pelvic free fluid is noted. Presumed injection sites noted within the anterior abdominal wall.  Musculoskeletal: Extensive lumbar spine disc degenerative change is noted, most prominent at L4-S1.  IMPRESSION: No acute intra-abdominal pathology.   Electronically Signed   By: Conchita Paris M.D.   On: 10/31/2014 11:35   Dg Chest Portable 1 View  10/30/2014   CLINICAL DATA:  Weakness and lightheaded since yesterday. Mild shortness of breath.  EXAM: PORTABLE CHEST - 1 VIEW  COMPARISON:  06/18/2014  FINDINGS: Sternotomy wires and left-sided pacemaker unchanged. There is a right-sided PICC line with tip overlying the expected region of the SVC. Lungs are adequately inflated with mild stable prominence of the bronchovascular markings over the right base and infrahilar regions. No focal consolidation or effusion. Mild stable cardiomegaly. There is calcified plaque over the aortic arch. Remainder of the exam is unchanged.  IMPRESSION: No acute cardiopulmonary disease.  Chronic stable prominence of the bronchovascular markings in the right base and infrahilar regions.  Stable cardiomegaly.   Electronically Signed   By: Marin Olp M.D.   On: 10/30/2014 18:17    ASSESSMENT: AML  PLAN:    1. AML: Patient is now fully recovered from her extended hospital stay and can proceed with her next treatment of azacitidine. She will receive azacitidine 75 mg/m IV on days 1 through 5 as well as on day 8 and 9. This will be considered cycle 3. Patient will then follow-[facility] at [facility] prior to her fourth cycle for bone marrow biopsy. Return to clinic in approximately one week to initiate cycle 3, day 1.  Patient expressed understanding and was in agreement with this plan. She also understands that She can call clinic at any time with any questions, concerns, or complaints.   No matching staging information was found for the patient.  Lloyd Huger, MD   11/02/2014 3:40 PM

## 2014-11-02 NOTE — Discharge Summary (Signed)
Physician Discharge Summary  Patient ID: [name] MRN: [id number] DOB/AGE: [birth year] 77 y.o.  Admit date: 10/30/2014 Discharge date: 11/02/2014  Admission Diagnoses:  Discharge Diagnoses:  Symptomatic anemia due to AML Active Problems:   SIRS (systemic inflammatory response syndrome)   Atrial fibrillation   CHF (congestive heart failure)   Mechanical heart valve present   Coronary artery disease   Essential hypertension   Antineoplastic chemotherapy induced pancytopenia   AML (acute myeloblastic leukemia)   Goals of care, counseling/discussion   Discharged Condition: stable  Hospital Course: admitted with hypothermia, dizziness, pancytopenia, with story concerning for sepsis/SIRS.  Blood and urine cultures sent, and remained negative.  Started on vancomycin/cefepime empirically.  Hgb<8, and pt with h/o similar symptoms when anemic.  Transfused 1 unit pRBC, with hgb>9 and essentially resolution of symptoms.  Cultures followed, with pt converted to levaquin on 5/26 when unable to obtain IV access and with no source of infection.  Tolerated well, with no fever or hypothermia throughout remainder of hospitalization.  Mobilized with PT and felt close to baseline.  Evaluated by Oncology and Palliative care.  Given extensive hospitalizations and complications, as well as current AML being treated for palliation (per oncology notes, not curable), discussed her desire for further treatment (with husband involved as well).  Pt to have bone marrow biopsy on 6/6 and will decide based on results on whether to continue therapy or not.    Maintained on lovenox for mechanical heart valve/afib; no bleeding noted.  Has been felt too high risk, esp with ongoing chemotherapy, to convert to PO anticoagulants.  AODM stable through hospitalization; bp initially low, but improved after transfusion, with home medications restarted.  No CHF symptoms.  Will d/c home in stable condition; regular diet, given  reduced PO intake.  Follow HR, BP, FSBS at home.  F/u with oncology.    Consults: oncology, palliative care   Discharge Exam: Blood pressure 140/66, pulse 83, temperature 98.5 F (36.9 C), temperature source Oral, resp. rate 18, height 5' 2"  (1.575 m), weight 53.071 kg (117 lb), SpO2 100 %.  Disposition:       Discharge Instructions    Diet general    Complete by:  As directed      Discharge instructions    Complete by:  As directed   Check your blood sugar daily and record this Check your heart rate and blood pressure daily and record this     Follow-up    Complete by:  As directed   With Dr. Grayland Ormond in 1 week     Increase activity slowly    Complete by:  As directed   With cane            Medication List    STOP taking these medications        Calcium + D3 600-200 MG-UNIT Tabs     lidocaine-prilocaine cream  Commonly known as:  EMLA      TAKE these medications        acetaminophen 325 MG tablet  Commonly known as:  TYLENOL  Take 650 mg by mouth every 4 (four) hours as needed for mild pain or headache.     allopurinol 300 MG tablet  Commonly known as:  ZYLOPRIM  Take 300 mg by mouth daily.     amLODipine 5 MG tablet  Commonly known as:  NORVASC  Take 10 mg by mouth daily.     brimonidine 0.15 % ophthalmic solution  Commonly known as:  ALPHAGAN  Place 1 drop into both eyes 2 (two) times daily.     carbidopa-levodopa 25-100 MG per tablet  Commonly known as:  SINEMET IR  Take 1 tablet by mouth 3 (three) times daily.     cholecalciferol 1000 UNITS tablet  Commonly known as:  VITAMIN D  Take 1,000 Units by mouth daily.     docusate sodium 100 MG capsule  Commonly known as:  COLACE  Take 1 capsule (100 mg total) by mouth 2 (two) times daily.     enoxaparin 60 MG/0.6ML injection  Commonly known as:  LOVENOX  Inject 60 mg into the skin daily.     ferrous sulfate 325 (65 FE) MG tablet  Take 325 mg by mouth daily with breakfast.     furosemide 40  MG tablet  Commonly known as:  LASIX  Take 40 mg by mouth 2 (two) times daily.     glipiZIDE 5 MG 24 hr tablet  Commonly known as:  GLUCOTROL XL  Take 5 mg by mouth daily.     latanoprost 0.005 % ophthalmic solution  Commonly known as:  XALATAN  Place 1 drop into both eyes at bedtime.     levofloxacin 250 MG tablet  Commonly known as:  LEVAQUIN  Take 1 tablet (250 mg total) by mouth daily. X 7 days     levothyroxine 100 MCG tablet  Commonly known as:  SYNTHROID, LEVOTHROID  Take 100 mcg by mouth daily before breakfast.     losartan 100 MG tablet  Commonly known as:  COZAAR  Take 100 mg by mouth daily.     omeprazole 40 MG capsule  Commonly known as:  PRILOSEC  Take 40 mg by mouth 2 (two) times daily.     potassium chloride SA 20 MEQ tablet  Commonly known as:  K-DUR,KLOR-CON  Take 20 mEq by mouth 2 (two) times daily.     prochlorperazine 10 MG tablet  Commonly known as:  COMPAZINE  Take 1 tablet (10 mg total) by mouth every 6 (six) hours as needed (Nausea or vomiting).     simvastatin 20 MG tablet  Commonly known as:  ZOCOR  Take 20 mg by mouth at bedtime.     sotalol 80 MG tablet  Commonly known as:  BETAPACE  Take 80 mg by mouth 2 (two) times daily.     vitamin B-12 250 MCG tablet  Commonly known as:  CYANOCOBALAMIN  Take 250 mcg by mouth daily.         Signed: Tama High III 11/02/2014, 8:06 AM

## 2014-11-02 NOTE — Progress Notes (Addendum)
Spoke with Dr. Ginette Pitman to make aware of inability to de-clot pt's double lumen picc line.  Ask if he would like [place] to remove it.  MD would like to wait and see what [name] would like to do.  Clarise Cruz, RN

## 2014-11-02 NOTE — Progress Notes (Signed)
Attempted to declott purple lumen with TPA.  Let dwell per policy, unable to get blood return after dwell time.  Line capped and reported to care RN Clarise Cruz.  Donnita Falls, RN 11/02/2014. 5:19 AM

## 2014-11-02 NOTE — Progress Notes (Signed)
[place]  [phone]) [phone] [phone]) [phone]  ID: [name] OB: Apr 22, 1936  MR#: [id number]  PZW#:[id number]  Patient Care Team: Adin Hector, MD as PCP - General (Internal Medicine)  CHIEF COMPLAINT:  Chief Complaint  Patient presents with  . Follow-up  . Chemotherapy    INTERVAL HISTORY: Patient returns to clinic today to initiate cycle 3, day 1 of azacitidine.  She continues to feel weak and fatigued, but otherwise feels well.  She denies any fevers. She has no neurologic complaints. She has a fair appetite. She denies any chest pain or shortness of breath. She has no nausea, vomiting, constipation, or diarrhea. Patient otherwise feels well and offers no further specific complaints.  REVIEW OF SYSTEMS:   Review of Systems  Constitutional: Positive for malaise/fatigue. Negative for fever.  Respiratory: Negative.   Cardiovascular: Negative.   Neurological: Positive for weakness.    As per HPI. Otherwise, a complete review of systems is negatve.  PAST MEDICAL HISTORY: Past Medical History  Diagnosis Date  . Atrial fibrillation   . Parkinson's disease   . Glaucoma   . Hypertension   . Hypothyroidism   . GERD (gastroesophageal reflux disease)   . High cholesterol   . Diabetes mellitus without complication   . CAD (coronary artery disease) of bypass graft   . History of appendectomy   . H/O: hysterectomy   . Leukemia     PAST SURGICAL HISTORY: Past Surgical History  Procedure Laterality Date  . Cardiac valve replacement    . Pacemaker insertion      FAMILY HISTORY Family History  Problem Relation Age of Onset  . CAD Other   . Heart disease Father        ADVANCED DIRECTIVES:    HEALTH MAINTENANCE: History  Substance Use Topics  . Smoking status: Former Smoker -- 1.00 packs/day for 10 years    Types: Cigarettes  . Smokeless tobacco: Not on file  . Alcohol Use: No     Colonoscopy:  PAP:  Bone density:  Lipid  panel:  Allergies  Allergen Reactions  . Hydrochlorothiazide Other (See Comments)    Reaction:  Unknown   . Lisinopril Cough    Current Outpatient Prescriptions  Medication Sig Dispense Refill  . prochlorperazine (COMPAZINE) 10 MG tablet Take 1 tablet (10 mg total) by mouth every 6 (six) hours as needed (Nausea or vomiting). (Patient not taking: Reported on 10/30/2014) 30 tablet 1  . acetaminophen (TYLENOL) 325 MG tablet Take 650 mg by mouth every 4 (four) hours as needed for mild pain or headache.    . allopurinol (ZYLOPRIM) 300 MG tablet Take 300 mg by mouth daily.    Marland Kitchen amLODipine (NORVASC) 5 MG tablet Take 10 mg by mouth daily.    . brimonidine (ALPHAGAN) 0.15 % ophthalmic solution Place 1 drop into both eyes 2 (two) times daily.    . carbidopa-levodopa (SINEMET IR) 25-100 MG per tablet Take 1 tablet by mouth 3 (three) times daily.    . cholecalciferol (VITAMIN D) 1000 UNITS tablet Take 1,000 Units by mouth daily.    Marland Kitchen docusate sodium (COLACE) 100 MG capsule Take 1 capsule (100 mg total) by mouth 2 (two) times daily. 10 capsule 0  . enoxaparin (LOVENOX) 60 MG/0.6ML injection Inject 60 mg into the skin daily.    . ferrous sulfate 325 (65 FE) MG tablet Take 325 mg by mouth daily with breakfast.    . furosemide (LASIX) 40 MG tablet Take 40 mg  by mouth 2 (two) times daily.    Marland Kitchen glipiZIDE (GLUCOTROL XL) 5 MG 24 hr tablet Take 5 mg by mouth daily.    Marland Kitchen latanoprost (XALATAN) 0.005 % ophthalmic solution Place 1 drop into both eyes at bedtime.    Marland Kitchen levofloxacin (LEVAQUIN) 250 MG tablet Take 1 tablet (250 mg total) by mouth daily. X 7 days 7 tablet 0  . levothyroxine (SYNTHROID, LEVOTHROID) 100 MCG tablet Take 100 mcg by mouth daily before breakfast.    . losartan (COZAAR) 100 MG tablet Take 100 mg by mouth daily.    Marland Kitchen omeprazole (PRILOSEC) 40 MG capsule Take 40 mg by mouth 2 (two) times daily.    . potassium chloride SA (K-DUR,KLOR-CON) 20 MEQ tablet Take 20 mEq by mouth 2 (two) times daily.      . simvastatin (ZOCOR) 20 MG tablet Take 20 mg by mouth at bedtime.    . sotalol (BETAPACE) 80 MG tablet Take 80 mg by mouth 2 (two) times daily.    . vitamin B-12 (CYANOCOBALAMIN) 250 MCG tablet Take 250 mcg by mouth daily.     No current facility-administered medications for this visit.   Facility-Administered Medications Ordered in Other Visits  Medication Dose Route Frequency Provider Last Rate Last Dose  . 0.9 %  sodium chloride infusion   Intravenous Continuous Lloyd Huger, MD 999 mL/hr at 10/29/14 1550      OBJECTIVE: Filed Vitals:   10/22/14 1537  BP: 124/76  Pulse: 84  Temp: 95.8 F (35.4 C)  Resp: 20     There is no weight on file to calculate BMI.    ECOG FS:1 - Symptomatic but completely ambulatory  General: Well-developed, well-nourished, no acute distress. Eyes: anicteric sclera. Lungs: Clear to auscultation bilaterally. Heart: Regular rate and rhythm. No rubs, murmurs, or gallops. Abdomen: Soft, nontender, nondistended. No organomegaly noted, normoactive bowel sounds. Musculoskeletal: No edema, cyanosis, or clubbing. Neuro: Alert, answering all questions appropriately. Cranial nerves grossly intact. Skin: No rashes or petechiae noted. Psych: Normal affect.   LAB RESULTS:  Lab Results  Component Value Date   NA 138 11/02/2014   K 3.8 11/02/2014   CL 106 11/02/2014   CO2 27 11/02/2014   GLUCOSE 87 11/02/2014   BUN 19 11/02/2014   CREATININE 1.32* 11/02/2014   CALCIUM 9.8 11/02/2014   PROT 6.0* 11/01/2014   ALBUMIN 2.9* 11/01/2014   AST 8* 11/01/2014   ALT <5* 11/01/2014   ALKPHOS 64 11/01/2014   BILITOT 0.6 11/01/2014   GFRNONAA 38* 11/02/2014   GFRAA 44* 11/02/2014    Lab Results  Component Value Date   WBC 2.6* 11/02/2014   NEUTROABS 2.1 10/30/2014   HGB 9.5* 11/02/2014   HCT 28.6* 11/02/2014   MCV 90.8 11/02/2014   PLT 89* 11/02/2014     STUDIES: Ct Abdomen Pelvis Wo Contrast  10/31/2014   CLINICAL DATA:  Anemia, weakness.  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  COMPARISON:  02/27/2011  FINDINGS: Lower chest: Pacer leads partly visualized. Mild cardiomegaly. Evidence of mitral valvuloplasty. Descending thoracic aortic ectasia is noted measuring 2.8 cm maximally at the diaphragmatic hiatus image 21.  Hepatobiliary: 3 mm too small to characterize hypodensity at the dome of the lateral segment left hepatic lobe identified image 12. Portal venous distention is noted subjectively. No intrahepatic ductal dilatation. Dependent gallstones  are noted within the otherwise normal-appearing gallbladder, image 26.  Pancreas: Normal  Spleen: Normal  Adrenals/Urinary Tract: Mild fullness of the adrenal glands noted bilaterally without measurable mass. Mild nonspecific perinephric stranding is identified. No hydroureteronephrosis. No radiopaque renal or ureteral calculi. The bladder is physiologically distended but otherwise unremarkable.  Stomach/Bowel: Moderate stool burden. Stomach is grossly normal. No bowel wall thickening or focal segmental dilatation is identified. The appendix is normal, image 65.  Vascular/Lymphatic: Severe atheromatous aortic calcification without abdominal aortic aneurysm. No lymphadenopathy.  Reproductive: Uterus and ovaries not visualized.  Other: Small pelvic free fluid is noted. Presumed injection sites noted within the anterior abdominal wall.  Musculoskeletal: Extensive lumbar spine disc degenerative change is noted, most prominent at L4-S1.  IMPRESSION: No acute intra-abdominal pathology.   Electronically Signed   By: Conchita Paris M.D.   On: 10/31/2014 11:35   Dg Chest Portable 1 View  10/30/2014   CLINICAL DATA:  Weakness and lightheaded since yesterday. Mild shortness of breath.  EXAM: PORTABLE CHEST - 1 VIEW   COMPARISON:  06/18/2014  FINDINGS: Sternotomy wires and left-sided pacemaker unchanged. There is a right-sided PICC line with tip overlying the expected region of the SVC. Lungs are adequately inflated with mild stable prominence of the bronchovascular markings over the right base and infrahilar regions. No focal consolidation or effusion. Mild stable cardiomegaly. There is calcified plaque over the aortic arch. Remainder of the exam is unchanged.  IMPRESSION: No acute cardiopulmonary disease.  Chronic stable prominence of the bronchovascular markings in the right base and infrahilar regions.  Stable cardiomegaly.   Electronically Signed   By: Marin Olp M.D.   On: 10/30/2014 18:17    ASSESSMENT: AML  PLAN:    1. AML: Proceed with cycle 3, day 1 of azacitidine 75 mg/m IV day. She will receive this treatment on days 1 through 5 as well as on day 8 and 9. Return to clinic daily for chemotherapy. Patient will have follow-up at [facility] for further evaluation and bone marrow biopsy in 2-3 weeks. She will then follow-up in approximately 4 weeks for consideration of cycle 4.  Patient expressed understanding and was in agreement with this plan. She also understands that She can call clinic at any time with any questions, concerns, or complaints.   No matching staging information was found for the patient.  Lloyd Huger, MD   11/02/2014 3:48 PM

## 2014-11-03 LAB — URINE CULTURE: Culture: >=100000

## 2014-11-04 LAB — CULTURE, BLOOD (ROUTINE X 2)
Culture: NO GROWTH
Culture: NO GROWTH

## 2014-11-06 ENCOUNTER — Inpatient Hospital Stay: Payer: Medicare Other

## 2014-11-06 ENCOUNTER — Inpatient Hospital Stay (HOSPITAL_BASED_OUTPATIENT_CLINIC_OR_DEPARTMENT_OTHER): Payer: Medicare Other | Admitting: Oncology

## 2014-11-06 VITALS — BP 114/72 | HR 73 | Temp 97.7°F | Resp 20 | Wt 115.1 lb

## 2014-11-06 DIAGNOSIS — C9200 Acute myeloblastic leukemia, not having achieved remission: Secondary | ICD-10-CM

## 2014-11-06 DIAGNOSIS — I4891 Unspecified atrial fibrillation: Secondary | ICD-10-CM | POA: Diagnosis not present

## 2014-11-06 DIAGNOSIS — G620 Drug-induced polyneuropathy: Secondary | ICD-10-CM

## 2014-11-06 DIAGNOSIS — G20 Parkinson's disease: Secondary | ICD-10-CM | POA: Diagnosis not present

## 2014-11-06 DIAGNOSIS — Z79899 Other long term (current) drug therapy: Secondary | ICD-10-CM | POA: Diagnosis not present

## 2014-11-06 DIAGNOSIS — K219 Gastro-esophageal reflux disease without esophagitis: Secondary | ICD-10-CM | POA: Diagnosis not present

## 2014-11-06 DIAGNOSIS — I10 Essential (primary) hypertension: Secondary | ICD-10-CM

## 2014-11-06 DIAGNOSIS — Z87891 Personal history of nicotine dependence: Secondary | ICD-10-CM | POA: Diagnosis not present

## 2014-11-06 DIAGNOSIS — D61818 Other pancytopenia: Secondary | ICD-10-CM

## 2014-11-06 DIAGNOSIS — R5383 Other fatigue: Secondary | ICD-10-CM

## 2014-11-06 DIAGNOSIS — T451X5S Adverse effect of antineoplastic and immunosuppressive drugs, sequela: Secondary | ICD-10-CM | POA: Diagnosis not present

## 2014-11-06 DIAGNOSIS — I2510 Atherosclerotic heart disease of native coronary artery without angina pectoris: Secondary | ICD-10-CM

## 2014-11-06 DIAGNOSIS — R531 Weakness: Secondary | ICD-10-CM

## 2014-11-06 DIAGNOSIS — D61811 Other drug-induced pancytopenia: Secondary | ICD-10-CM

## 2014-11-06 DIAGNOSIS — E780 Pure hypercholesterolemia: Secondary | ICD-10-CM | POA: Diagnosis not present

## 2014-11-06 DIAGNOSIS — E039 Hypothyroidism, unspecified: Secondary | ICD-10-CM | POA: Diagnosis not present

## 2014-11-06 DIAGNOSIS — R5381 Other malaise: Secondary | ICD-10-CM | POA: Diagnosis not present

## 2014-11-06 DIAGNOSIS — Z5111 Encounter for antineoplastic chemotherapy: Secondary | ICD-10-CM | POA: Diagnosis present

## 2014-11-06 DIAGNOSIS — E785 Hyperlipidemia, unspecified: Secondary | ICD-10-CM

## 2014-11-06 DIAGNOSIS — E119 Type 2 diabetes mellitus without complications: Secondary | ICD-10-CM

## 2014-11-06 LAB — CBC WITH DIFFERENTIAL/PLATELET
BASOS ABS: 0 10*3/uL (ref 0–0.1)
Eosinophils Absolute: 0.1 10*3/uL (ref 0–0.7)
Eosinophils Relative: 5 %
HEMATOCRIT: 29.9 % — AB (ref 35.0–47.0)
HEMOGLOBIN: 9.7 g/dL — AB (ref 12.0–16.0)
Lymphs Abs: 0.8 10*3/uL — ABNORMAL LOW (ref 1.0–3.6)
MCH: 29.7 pg (ref 26.0–34.0)
MCHC: 32.5 g/dL (ref 32.0–36.0)
MCV: 91.2 fL (ref 80.0–100.0)
MONO ABS: 0.1 10*3/uL — AB (ref 0.2–0.9)
Monocytes Relative: 3 %
Neutro Abs: 1.4 10*3/uL (ref 1.4–6.5)
Neutrophils Relative %: 60 %
Platelets: 94 10*3/uL — ABNORMAL LOW (ref 150–440)
RBC: 3.28 MIL/uL — ABNORMAL LOW (ref 3.80–5.20)
RDW: 21 % — AB (ref 11.5–14.5)
WBC: 2.4 10*3/uL — AB (ref 3.6–11.0)

## 2014-11-06 LAB — COMPREHENSIVE METABOLIC PANEL
ALK PHOS: 84 U/L (ref 38–126)
ALT: 5 U/L — AB (ref 14–54)
ANION GAP: 7 (ref 5–15)
AST: 15 U/L (ref 15–41)
Albumin: 3.8 g/dL (ref 3.5–5.0)
BILIRUBIN TOTAL: 0.5 mg/dL (ref 0.3–1.2)
BUN: 32 mg/dL — ABNORMAL HIGH (ref 6–20)
CO2: 25 mmol/L (ref 22–32)
Calcium: 9.4 mg/dL (ref 8.9–10.3)
Chloride: 101 mmol/L (ref 101–111)
Creatinine, Ser: 1.93 mg/dL — ABNORMAL HIGH (ref 0.44–1.00)
GFR calc non Af Amer: 24 mL/min — ABNORMAL LOW (ref >60)
GFR, EST AFRICAN AMERICAN: 28 mL/min — AB (ref >60)
GLUCOSE: 102 mg/dL — AB (ref 65–99)
POTASSIUM: 4.9 mmol/L (ref 3.5–5.1)
SODIUM: 133 mmol/L — AB (ref 135–145)
Total Protein: 7.2 g/dL (ref 6.5–8.1)

## 2014-11-19 ENCOUNTER — Telehealth: Payer: Self-pay

## 2014-11-19 ENCOUNTER — Inpatient Hospital Stay: Payer: Medicare Other | Attending: Oncology

## 2014-11-19 ENCOUNTER — Ambulatory Visit: Payer: Medicare Other

## 2014-11-19 ENCOUNTER — Inpatient Hospital Stay (HOSPITAL_BASED_OUTPATIENT_CLINIC_OR_DEPARTMENT_OTHER): Payer: Medicare Other | Admitting: Oncology

## 2014-11-19 VITALS — BP 100/63 | HR 102 | Temp 97.4°F | Resp 16 | Wt 112.0 lb

## 2014-11-19 DIAGNOSIS — E785 Hyperlipidemia, unspecified: Secondary | ICD-10-CM

## 2014-11-19 DIAGNOSIS — C9200 Acute myeloblastic leukemia, not having achieved remission: Secondary | ICD-10-CM | POA: Insufficient documentation

## 2014-11-19 DIAGNOSIS — I25810 Atherosclerosis of coronary artery bypass graft(s) without angina pectoris: Secondary | ICD-10-CM | POA: Diagnosis not present

## 2014-11-19 DIAGNOSIS — R531 Weakness: Secondary | ICD-10-CM

## 2014-11-19 DIAGNOSIS — Z87891 Personal history of nicotine dependence: Secondary | ICD-10-CM | POA: Insufficient documentation

## 2014-11-19 DIAGNOSIS — E039 Hypothyroidism, unspecified: Secondary | ICD-10-CM | POA: Insufficient documentation

## 2014-11-19 DIAGNOSIS — I10 Essential (primary) hypertension: Secondary | ICD-10-CM | POA: Insufficient documentation

## 2014-11-19 DIAGNOSIS — R5383 Other fatigue: Secondary | ICD-10-CM | POA: Diagnosis not present

## 2014-11-19 DIAGNOSIS — Z950 Presence of cardiac pacemaker: Secondary | ICD-10-CM | POA: Diagnosis not present

## 2014-11-19 DIAGNOSIS — E119 Type 2 diabetes mellitus without complications: Secondary | ICD-10-CM | POA: Diagnosis not present

## 2014-11-19 DIAGNOSIS — G620 Drug-induced polyneuropathy: Secondary | ICD-10-CM

## 2014-11-19 DIAGNOSIS — G20 Parkinson's disease: Secondary | ICD-10-CM | POA: Diagnosis not present

## 2014-11-19 DIAGNOSIS — Z79899 Other long term (current) drug therapy: Secondary | ICD-10-CM | POA: Diagnosis not present

## 2014-11-19 DIAGNOSIS — D61811 Other drug-induced pancytopenia: Secondary | ICD-10-CM

## 2014-11-19 DIAGNOSIS — D630 Anemia in neoplastic disease: Secondary | ICD-10-CM | POA: Insufficient documentation

## 2014-11-19 DIAGNOSIS — E780 Pure hypercholesterolemia: Secondary | ICD-10-CM | POA: Insufficient documentation

## 2014-11-19 DIAGNOSIS — D61818 Other pancytopenia: Secondary | ICD-10-CM | POA: Diagnosis not present

## 2014-11-19 DIAGNOSIS — Z5111 Encounter for antineoplastic chemotherapy: Secondary | ICD-10-CM | POA: Diagnosis present

## 2014-11-19 DIAGNOSIS — Z9889 Other specified postprocedural states: Secondary | ICD-10-CM | POA: Diagnosis not present

## 2014-11-19 DIAGNOSIS — K219 Gastro-esophageal reflux disease without esophagitis: Secondary | ICD-10-CM | POA: Diagnosis not present

## 2014-11-19 DIAGNOSIS — T451X5S Adverse effect of antineoplastic and immunosuppressive drugs, sequela: Secondary | ICD-10-CM | POA: Diagnosis not present

## 2014-11-19 DIAGNOSIS — I4891 Unspecified atrial fibrillation: Secondary | ICD-10-CM | POA: Insufficient documentation

## 2014-11-19 DIAGNOSIS — F17211 Nicotine dependence, cigarettes, in remission: Secondary | ICD-10-CM | POA: Insufficient documentation

## 2014-11-19 DIAGNOSIS — I2510 Atherosclerotic heart disease of native coronary artery without angina pectoris: Secondary | ICD-10-CM

## 2014-11-19 LAB — CBC WITH DIFFERENTIAL/PLATELET
Basophils Absolute: 0 10*3/uL (ref 0–0.1)
Basophils Relative: 0 %
Eosinophils Absolute: 0 10*3/uL (ref 0–0.7)
Eosinophils Relative: 2 %
HEMATOCRIT: 25.7 % — AB (ref 35.0–47.0)
Hemoglobin: 8.5 g/dL — ABNORMAL LOW (ref 12.0–16.0)
Lymphocytes Relative: 49 %
Lymphs Abs: 0.7 10*3/uL — ABNORMAL LOW (ref 1.0–3.6)
MCH: 30.9 pg (ref 26.0–34.0)
MCHC: 33.1 g/dL (ref 32.0–36.0)
MCV: 93.3 fL (ref 80.0–100.0)
MONO ABS: 0 10*3/uL — AB (ref 0.2–0.9)
Monocytes Relative: 2 %
Neutro Abs: 0.7 10*3/uL — ABNORMAL LOW (ref 1.4–6.5)
Neutrophils Relative %: 47 %
Platelets: 135 10*3/uL — ABNORMAL LOW (ref 150–440)
RBC: 2.75 MIL/uL — AB (ref 3.80–5.20)
RDW: 22.1 % — AB (ref 11.5–14.5)
WBC: 1.4 10*3/uL — CL (ref 3.6–11.0)

## 2014-11-19 LAB — BASIC METABOLIC PANEL
Anion gap: 5 (ref 5–15)
BUN: 35 mg/dL — ABNORMAL HIGH (ref 6–20)
CO2: 25 mmol/L (ref 22–32)
CREATININE: 1.86 mg/dL — AB (ref 0.44–1.00)
Calcium: 9.5 mg/dL (ref 8.9–10.3)
Chloride: 103 mmol/L (ref 101–111)
GFR calc Af Amer: 29 mL/min — ABNORMAL LOW (ref >60)
GFR calc non Af Amer: 25 mL/min — ABNORMAL LOW (ref >60)
Glucose, Bld: 109 mg/dL — ABNORMAL HIGH (ref 65–99)
Potassium: 4.3 mmol/L (ref 3.5–5.1)
Sodium: 133 mmol/L — ABNORMAL LOW (ref 135–145)

## 2014-11-19 MED ORDER — ONDANSETRON HCL 4 MG PO TABS
8.0000 mg | ORAL_TABLET | Freq: Once | ORAL | Status: AC
  Administered 2014-11-19: 8 mg via ORAL
  Filled 2014-11-19: qty 2

## 2014-11-19 MED ORDER — AZACITIDINE CHEMO SQ INJECTION
75.0000 mg/m2 | Freq: Once | INTRAMUSCULAR | Status: AC
Start: 2014-11-19 — End: 2014-11-19
  Administered 2014-11-19: 112.5 mg via SUBCUTANEOUS
  Filled 2014-11-19: qty 4.5

## 2014-11-19 NOTE — Progress Notes (Signed)
[place]  Telephone:(336) [phone] [phone]) [phone]  ID: [name] OB: 1935-10-05  MR#: [id number]  EUM#:[id number]  Patient Care Team: Adin Hector, MD as PCP - General (Internal Medicine)  CHIEF COMPLAINT:  Chief Complaint  Patient presents with  . Follow-up    AML    INTERVAL HISTORY: Patient returns to clinic today for further evaluation and consideration of cycle 4 of azacitidine. She feels significantly improved and nearly back to her baseline. She denies any fevers. She has no neurologic complaints. She has a fair appetite. She denies any chest pain or shortness of breath. She has no nausea, vomiting, constipation, or diarrhea. Patient offers no further specific complaints.  REVIEW OF SYSTEMS:   Review of Systems  Constitutional: Positive for malaise/fatigue. Negative for fever.  Respiratory: Negative.   Cardiovascular: Negative.   Neurological: Positive for weakness.    As per HPI. Otherwise, a complete review of systems is negatve.  PAST MEDICAL HISTORY: Past Medical History  Diagnosis Date  . Atrial fibrillation   . Parkinson's disease   . Glaucoma   . Hypertension   . Hypothyroidism   . GERD (gastroesophageal reflux disease)   . High cholesterol   . Diabetes mellitus without complication   . CAD (coronary artery disease) of bypass graft   . History of appendectomy   . H/O: hysterectomy   . Leukemia     PAST SURGICAL HISTORY: Past Surgical History  Procedure Laterality Date  . Cardiac valve replacement    . Pacemaker insertion      FAMILY HISTORY Family History  Problem Relation Age of Onset  . CAD Other   . Heart disease Father        ADVANCED DIRECTIVES:    HEALTH MAINTENANCE: History  Substance Use Topics  . Smoking status: Former Smoker -- 1.00 packs/day for 10 years    Types: Cigarettes  . Smokeless tobacco: Not on file  . Alcohol Use: No     Colonoscopy:  PAP:  Bone density:  Lipid  panel:  Allergies  Allergen Reactions  . Hydrochlorothiazide Other (See Comments)    Reaction:  Unknown   . Lisinopril Cough    Current Outpatient Prescriptions  Medication Sig Dispense Refill  . acetaminophen (TYLENOL) 325 MG tablet Take 650 mg by mouth every 4 (four) hours as needed for mild pain or headache.    . allopurinol (ZYLOPRIM) 300 MG tablet Take 300 mg by mouth daily.    Marland Kitchen amLODipine (NORVASC) 5 MG tablet Take 10 mg by mouth daily.    . brimonidine (ALPHAGAN) 0.15 % ophthalmic solution Place 1 drop into both eyes 2 (two) times daily.    . carbidopa-levodopa (SINEMET IR) 25-100 MG per tablet Take 1 tablet by mouth 3 (three) times daily.    . cholecalciferol (VITAMIN D) 1000 UNITS tablet Take 1,000 Units by mouth daily.    Marland Kitchen docusate sodium (COLACE) 100 MG capsule Take 1 capsule (100 mg total) by mouth 2 (two) times daily. 10 capsule 0  . enoxaparin (LOVENOX) 60 MG/0.6ML injection Inject 60 mg into the skin daily.    . ferrous sulfate 325 (65 FE) MG tablet Take 325 mg by mouth daily with breakfast.    . glipiZIDE (GLUCOTROL XL) 5 MG 24 hr tablet Take 5 mg by mouth daily.    Marland Kitchen latanoprost (XALATAN) 0.005 % ophthalmic solution Place 1 drop into both eyes at bedtime.    Marland Kitchen levothyroxine (SYNTHROID, LEVOTHROID) 100 MCG tablet Take 100 mcg  by mouth daily before breakfast.    . losartan (COZAAR) 100 MG tablet Take 100 mg by mouth daily.    Marland Kitchen omeprazole (PRILOSEC) 40 MG capsule Take 40 mg by mouth 2 (two) times daily.    . prochlorperazine (COMPAZINE) 10 MG tablet Take 1 tablet (10 mg total) by mouth every 6 (six) hours as needed (Nausea or vomiting). 30 tablet 1  . simvastatin (ZOCOR) 20 MG tablet Take 20 mg by mouth at bedtime.    . sotalol (BETAPACE) 80 MG tablet Take 80 mg by mouth 2 (two) times daily.    . vitamin B-12 (CYANOCOBALAMIN) 250 MCG tablet Take 250 mcg by mouth daily.     No current facility-administered medications for this visit.   Facility-Administered Medications  Ordered in Other Visits  Medication Dose Route Frequency Provider Last Rate Last Dose  . 0.9 %  sodium chloride infusion   Intravenous Continuous Lloyd Huger, MD 999 mL/hr at 10/29/14 1550      OBJECTIVE: Filed Vitals:   11/19/14 1507  BP: 100/63  Pulse: 102  Temp: 97.4 F (36.3 C)  Resp: 16     Body mass index is 20.48 kg/(m^2).    ECOG FS:2 - Symptomatic, <50% confined to bed  General: Well-developed, well-nourished, no acute distress. Eyes: anicteric sclera. Lungs: Clear to auscultation bilaterally. Heart: Regular rate and rhythm. No rubs, murmurs, or gallops. Abdomen: Soft, nontender, nondistended. No organomegaly noted, normoactive bowel sounds. Musculoskeletal: No edema, cyanosis, or clubbing. Neuro: Alert, answering all questions appropriately. Cranial nerves grossly intact. Skin: No rashes or petechiae noted. Psych: Normal affect.   LAB RESULTS:  Lab Results  Component Value Date   NA 133* 11/19/2014   K 4.3 11/19/2014   CL 103 11/19/2014   CO2 25 11/19/2014   GLUCOSE 109* 11/19/2014   BUN 35* 11/19/2014   CREATININE 1.86* 11/19/2014   CALCIUM 9.5 11/19/2014   PROT 7.2 11/06/2014   ALBUMIN 3.8 11/06/2014   AST 15 11/06/2014   ALT 5* 11/06/2014   ALKPHOS 84 11/06/2014   BILITOT 0.5 11/06/2014   GFRNONAA 25* 11/19/2014   GFRAA 29* 11/19/2014    Lab Results  Component Value Date   WBC 1.4* 11/19/2014   NEUTROABS 0.7* 11/19/2014   HGB 8.5* 11/19/2014   HCT 25.7* 11/19/2014   MCV 93.3 11/19/2014   PLT 135* 11/19/2014     STUDIES: Ct Abdomen Pelvis Wo Contrast  10/31/2014   CLINICAL DATA:  Anemia, weakness. 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  COMPARISON:  02/27/2011  FINDINGS: Lower chest: Pacer leads partly visualized. Mild cardiomegaly. Evidence  of mitral valvuloplasty. Descending thoracic aortic ectasia is noted measuring 2.8 cm maximally at the diaphragmatic hiatus image 21.  Hepatobiliary: 3 mm too small to characterize hypodensity at the dome of the lateral segment left hepatic lobe identified image 12. Portal venous distention is noted subjectively. No intrahepatic ductal dilatation. Dependent gallstones are noted within the otherwise normal-appearing gallbladder, image 26.  Pancreas: Normal  Spleen: Normal  Adrenals/Urinary Tract: Mild fullness of the adrenal glands noted bilaterally without measurable mass. Mild nonspecific perinephric stranding is identified. No hydroureteronephrosis. No radiopaque renal or ureteral calculi. The bladder is physiologically distended but otherwise unremarkable.  Stomach/Bowel: Moderate stool burden. Stomach is grossly normal. No bowel wall thickening or focal segmental dilatation is identified. The appendix is  normal, image 65.  Vascular/Lymphatic: Severe atheromatous aortic calcification without abdominal aortic aneurysm. No lymphadenopathy.  Reproductive: Uterus and ovaries not visualized.  Other: Small pelvic free fluid is noted. Presumed injection sites noted within the anterior abdominal wall.  Musculoskeletal: Extensive lumbar spine disc degenerative change is noted, most prominent at L4-S1.  IMPRESSION: No acute intra-abdominal pathology.   Electronically Signed   By: Conchita Paris M.D.   On: 10/31/2014 11:35   Dg Chest Portable 1 View  10/30/2014   CLINICAL DATA:  Weakness and lightheaded since yesterday. Mild shortness of breath.  EXAM: PORTABLE CHEST - 1 VIEW  COMPARISON:  06/18/2014  FINDINGS: Sternotomy wires and left-sided pacemaker unchanged. There is a right-sided PICC line with tip overlying the expected region of the SVC. Lungs are adequately inflated with mild stable prominence of the bronchovascular markings over the right base and infrahilar regions. No focal consolidation or effusion. Mild  stable cardiomegaly. There is calcified plaque over the aortic arch. Remainder of the exam is unchanged.  IMPRESSION: No acute cardiopulmonary disease.  Chronic stable prominence of the bronchovascular markings in the right base and infrahilar regions.  Stable cardiomegaly.   Electronically Signed   By: Marin Olp M.D.   On: 10/30/2014 18:17    ASSESSMENT: AML  PLAN:    1. AML: Hospice and comfort care were previously discussed with the patient, but she wishes to pursue additional treatment at this time. Bone marrow biopsy was improved, but still contained AML at approximately 19% of her sample. Proceed with cycle 4, day 1 of azacitidine 75 mg/m IV today. She will receive this treatment on days 1 through 5 as well as on day 8 and 9. Return to clinic on Thursday and Monday for laboratory work as well and then in 2 weeks for further evaluation. Will plan for cycle 5 in approximately 4 weeks. Follow-up with [facility] as previously scheduled. 2. Pancytopenia: Secondary to AML as well as chemotherapy. Monitor.  Patient expressed understanding and was in agreement with this plan. She also understands that She can call clinic at any time with any questions, concerns, or complaints.   Lloyd Huger, MD   11/19/2014 5:23 PM

## 2014-11-19 NOTE — Progress Notes (Signed)
[place]  [phone] [phone]  ID: [name] OB: 12-Dec-1935  MR#: [id number]  OVF#:[id number]  Patient Care Team: Adin Hector, MD as PCP - General (Internal Medicine)  CHIEF COMPLAINT:  Chief Complaint  Patient presents with  . Follow-up    AML    INTERVAL HISTORY: Patient returns to clinic today for hospital follow-up and treatment planning. She continues to feel weak and fatigued, but has improved since her hospitalization. She denies any fevers. She has no neurologic complaints. She has a fair appetite. She denies any chest pain or shortness of breath. She has no nausea, vomiting, constipation, or diarrhea. Patient otherwise feels well and offers no further specific complaints.  REVIEW OF SYSTEMS:   Review of Systems  Constitutional: Positive for malaise/fatigue. Negative for fever.  Respiratory: Negative.   Cardiovascular: Negative.   Neurological: Positive for weakness.    As per HPI. Otherwise, a complete review of systems is negatve.  PAST MEDICAL HISTORY: Past Medical History  Diagnosis Date  . Atrial fibrillation   . Parkinson's disease   . Glaucoma   . Hypertension   . Hypothyroidism   . GERD (gastroesophageal reflux disease)   . High cholesterol   . Diabetes mellitus without complication   . CAD (coronary artery disease) of bypass graft   . History of appendectomy   . H/O: hysterectomy   . Leukemia     PAST SURGICAL HISTORY: Past Surgical History  Procedure Laterality Date  . Cardiac valve replacement    . Pacemaker insertion      FAMILY HISTORY Family History  Problem Relation Age of Onset  . CAD Other   . Heart disease Father        ADVANCED DIRECTIVES:    HEALTH MAINTENANCE: History  Substance Use Topics  . Smoking status: Former Smoker -- 1.00 packs/day for 10 years    Types: Cigarettes  . Smokeless tobacco: Not on file  . Alcohol Use: No     Colonoscopy:  PAP:  Bone  density:  Lipid panel:  Allergies  Allergen Reactions  . Hydrochlorothiazide Other (See Comments)    Reaction:  Unknown   . Lisinopril Cough    Current Outpatient Prescriptions  Medication Sig Dispense Refill  . acetaminophen (TYLENOL) 325 MG tablet Take 650 mg by mouth every 4 (four) hours as needed for mild pain or headache.    . allopurinol (ZYLOPRIM) 300 MG tablet Take 300 mg by mouth daily.    Marland Kitchen amLODipine (NORVASC) 5 MG tablet Take 10 mg by mouth daily.    . brimonidine (ALPHAGAN) 0.15 % ophthalmic solution Place 1 drop into both eyes 2 (two) times daily.    . carbidopa-levodopa (SINEMET IR) 25-100 MG per tablet Take 1 tablet by mouth 3 (three) times daily.    . cholecalciferol (VITAMIN D) 1000 UNITS tablet Take 1,000 Units by mouth daily.    Marland Kitchen docusate sodium (COLACE) 100 MG capsule Take 1 capsule (100 mg total) by mouth 2 (two) times daily. 10 capsule 0  . enoxaparin (LOVENOX) 60 MG/0.6ML injection Inject 60 mg into the skin daily.    . ferrous sulfate 325 (65 FE) MG tablet Take 325 mg by mouth daily with breakfast.    . glipiZIDE (GLUCOTROL XL) 5 MG 24 hr tablet Take 5 mg by mouth daily.    Marland Kitchen latanoprost (XALATAN) 0.005 % ophthalmic solution Place 1 drop into both eyes at bedtime.    Marland Kitchen levothyroxine (SYNTHROID, LEVOTHROID) 100 MCG tablet  Take 100 mcg by mouth daily before breakfast.    . losartan (COZAAR) 100 MG tablet Take 100 mg by mouth daily.    Marland Kitchen omeprazole (PRILOSEC) 40 MG capsule Take 40 mg by mouth 2 (two) times daily.    . prochlorperazine (COMPAZINE) 10 MG tablet Take 1 tablet (10 mg total) by mouth every 6 (six) hours as needed (Nausea or vomiting). 30 tablet 1  . simvastatin (ZOCOR) 20 MG tablet Take 20 mg by mouth at bedtime.    . sotalol (BETAPACE) 80 MG tablet Take 80 mg by mouth 2 (two) times daily.    . vitamin B-12 (CYANOCOBALAMIN) 250 MCG tablet Take 250 mcg by mouth daily.     No current facility-administered medications for this visit.    Facility-Administered Medications Ordered in Other Visits  Medication Dose Route Frequency Provider Last Rate Last Dose  . 0.9 %  sodium chloride infusion   Intravenous Continuous Lloyd Huger, MD 999 mL/hr at 10/29/14 1550      OBJECTIVE: Filed Vitals:   11/06/14 1607  BP: 114/72  Pulse: 73  Temp: 97.7 F (36.5 C)  Resp: 20     Body mass index is 21.04 kg/(m^2).    ECOG FS:2 - Symptomatic, <50% confined to bed  General: Well-developed, well-nourished, no acute distress. Eyes: anicteric sclera. Lungs: Clear to auscultation bilaterally. Heart: Regular rate and rhythm. No rubs, murmurs, or gallops. Abdomen: Soft, nontender, nondistended. No organomegaly noted, normoactive bowel sounds. Musculoskeletal: No edema, cyanosis, or clubbing. Neuro: Alert, answering all questions appropriately. Cranial nerves grossly intact. Skin: No rashes or petechiae noted. Psych: Normal affect.   LAB RESULTS:  Lab Results  Component Value Date   NA 133* 11/19/2014   K 4.3 11/19/2014   CL 103 11/19/2014   CO2 25 11/19/2014   GLUCOSE 109* 11/19/2014   BUN 35* 11/19/2014   CREATININE 1.86* 11/19/2014   CALCIUM 9.5 11/19/2014   PROT 7.2 11/06/2014   ALBUMIN 3.8 11/06/2014   AST 15 11/06/2014   ALT 5* 11/06/2014   ALKPHOS 84 11/06/2014   BILITOT 0.5 11/06/2014   GFRNONAA 25* 11/19/2014   GFRAA 29* 11/19/2014    Lab Results  Component Value Date   WBC 1.4* 11/19/2014   NEUTROABS 0.7* 11/19/2014   HGB 8.5* 11/19/2014   HCT 25.7* 11/19/2014   MCV 93.3 11/19/2014   PLT 135* 11/19/2014     STUDIES: Ct Abdomen Pelvis Wo Contrast  10/31/2014   CLINICAL DATA:  Anemia, weakness. 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  COMPARISON:  02/27/2011  FINDINGS: Lower chest: Pacer leads partly  visualized. Mild cardiomegaly. Evidence of mitral valvuloplasty. Descending thoracic aortic ectasia is noted measuring 2.8 cm maximally at the diaphragmatic hiatus image 21.  Hepatobiliary: 3 mm too small to characterize hypodensity at the dome of the lateral segment left hepatic lobe identified image 12. Portal venous distention is noted subjectively. No intrahepatic ductal dilatation. Dependent gallstones are noted within the otherwise normal-appearing gallbladder, image 26.  Pancreas: Normal  Spleen: Normal  Adrenals/Urinary Tract: Mild fullness of the adrenal glands noted bilaterally without measurable mass. Mild nonspecific perinephric stranding is identified. No hydroureteronephrosis. No radiopaque renal or ureteral calculi. The bladder is physiologically distended but otherwise unremarkable.  Stomach/Bowel: Moderate stool burden. Stomach is grossly normal. No bowel wall thickening or focal segmental dilatation is identified.  The appendix is normal, image 65.  Vascular/Lymphatic: Severe atheromatous aortic calcification without abdominal aortic aneurysm. No lymphadenopathy.  Reproductive: Uterus and ovaries not visualized.  Other: Small pelvic free fluid is noted. Presumed injection sites noted within the anterior abdominal wall.  Musculoskeletal: Extensive lumbar spine disc degenerative change is noted, most prominent at L4-S1.  IMPRESSION: No acute intra-abdominal pathology.   Electronically Signed   By: Conchita Paris M.D.   On: 10/31/2014 11:35   Dg Chest Portable 1 View  10/30/2014   CLINICAL DATA:  Weakness and lightheaded since yesterday. Mild shortness of breath.  EXAM: PORTABLE CHEST - 1 VIEW  COMPARISON:  06/18/2014  FINDINGS: Sternotomy wires and left-sided pacemaker unchanged. There is a right-sided PICC line with tip overlying the expected region of the SVC. Lungs are adequately inflated with mild stable prominence of the bronchovascular markings over the right base and infrahilar regions. No  focal consolidation or effusion. Mild stable cardiomegaly. There is calcified plaque over the aortic arch. Remainder of the exam is unchanged.  IMPRESSION: No acute cardiopulmonary disease.  Chronic stable prominence of the bronchovascular markings in the right base and infrahilar regions.  Stable cardiomegaly.   Electronically Signed   By: Marin Olp M.D.   On: 10/30/2014 18:17    ASSESSMENT: AML  PLAN:    1. AML: Hospice and comfort care were discussed with the patient, but she wishes delayed his treatment until she has her bone marrow biopsy in follow-up at [facility] in approximately one week. Patient will return to clinic on November 19, 2014 to discuss the results of her bone marrow biopsy and decide whether she wishes to continue to pursue treatment. If she pursues chemotherapy will proceed with cycle 4, day 1 of azacitidine 75 mg/m IV day. She will receive this treatment on days 1 through 5 as well as on day 8 and 9.  2. Pancytopenia: Secondary to AML as well as chemotherapy. Monitor.  Patient expressed understanding and was in agreement with this plan. She also understands that She can call clinic at any time with any questions, concerns, or complaints.   No matching staging information was found for the patient.  Lloyd Huger, MD   11/19/2014 5:18 PM

## 2014-11-19 NOTE — Telephone Encounter (Signed)
Neut =0.7, md ok with treatment

## 2014-11-20 ENCOUNTER — Inpatient Hospital Stay: Payer: Medicare Other

## 2014-11-20 DIAGNOSIS — C9200 Acute myeloblastic leukemia, not having achieved remission: Secondary | ICD-10-CM | POA: Diagnosis not present

## 2014-11-20 MED ORDER — AZACITIDINE CHEMO SQ INJECTION
75.0000 mg/m2 | Freq: Once | INTRAMUSCULAR | Status: AC
  Administered 2014-11-20: 112.5 mg via SUBCUTANEOUS
  Filled 2014-11-20: qty 4.5

## 2014-11-20 MED ORDER — ONDANSETRON HCL 4 MG PO TABS
8.0000 mg | ORAL_TABLET | Freq: Once | ORAL | Status: AC
Start: 2014-11-20 — End: 2014-11-20
  Administered 2014-11-20: 8 mg via ORAL
  Filled 2014-11-20: qty 2

## 2014-11-21 ENCOUNTER — Inpatient Hospital Stay: Payer: Medicare Other

## 2014-11-21 DIAGNOSIS — C9200 Acute myeloblastic leukemia, not having achieved remission: Secondary | ICD-10-CM

## 2014-11-21 MED ORDER — ONDANSETRON HCL 4 MG PO TABS
ORAL_TABLET | ORAL | Status: AC
  Filled 2014-11-21: qty 2

## 2014-11-21 MED ORDER — AZACITIDINE CHEMO SQ INJECTION
75.0000 mg/m2 | Freq: Once | INTRAMUSCULAR | Status: AC
  Administered 2014-11-21: 112.5 mg via SUBCUTANEOUS
  Filled 2014-11-21: qty 4.5

## 2014-11-21 MED ORDER — ONDANSETRON HCL 4 MG PO TABS
8.0000 mg | ORAL_TABLET | Freq: Once | ORAL | Status: AC
  Administered 2014-11-21: 8 mg via ORAL

## 2014-11-22 ENCOUNTER — Inpatient Hospital Stay: Payer: Medicare Other

## 2014-11-22 VITALS — BP 94/61 | HR 88 | Resp 20

## 2014-11-22 DIAGNOSIS — C9200 Acute myeloblastic leukemia, not having achieved remission: Secondary | ICD-10-CM

## 2014-11-22 MED ORDER — AZACITIDINE CHEMO SQ INJECTION
75.0000 mg/m2 | Freq: Once | INTRAMUSCULAR | Status: AC
  Administered 2014-11-22: 112.5 mg via SUBCUTANEOUS
  Filled 2014-11-22: qty 4.5

## 2014-11-22 MED ORDER — ONDANSETRON HCL 4 MG PO TABS
8.0000 mg | ORAL_TABLET | Freq: Once | ORAL | Status: AC
  Administered 2014-11-22: 8 mg via ORAL
  Filled 2014-11-22: qty 2

## 2014-11-23 ENCOUNTER — Inpatient Hospital Stay: Payer: Medicare Other

## 2014-11-23 VITALS — BP 104/70 | HR 94 | Temp 96.9°F | Resp 18

## 2014-11-23 DIAGNOSIS — C9200 Acute myeloblastic leukemia, not having achieved remission: Secondary | ICD-10-CM

## 2014-11-23 MED ORDER — ONDANSETRON HCL 4 MG PO TABS
8.0000 mg | ORAL_TABLET | Freq: Once | ORAL | Status: AC
  Administered 2014-11-23: 8 mg via ORAL
  Filled 2014-11-23: qty 2

## 2014-11-23 MED ORDER — AZACITIDINE CHEMO SQ INJECTION
75.0000 mg/m2 | Freq: Once | INTRAMUSCULAR | Status: AC
  Administered 2014-11-23: 112.5 mg via SUBCUTANEOUS
  Filled 2014-11-23: qty 4.5

## 2014-11-26 ENCOUNTER — Inpatient Hospital Stay: Payer: Medicare Other

## 2014-11-26 DIAGNOSIS — C9200 Acute myeloblastic leukemia, not having achieved remission: Secondary | ICD-10-CM | POA: Diagnosis not present

## 2014-11-26 MED ORDER — AZACITIDINE CHEMO SQ INJECTION
75.0000 mg/m2 | Freq: Once | INTRAMUSCULAR | Status: AC
  Administered 2014-11-26: 112.5 mg via SUBCUTANEOUS
  Filled 2014-11-26: qty 4.5

## 2014-11-26 MED ORDER — ONDANSETRON HCL 4 MG PO TABS
8.0000 mg | ORAL_TABLET | Freq: Once | ORAL | Status: AC
  Administered 2014-11-26: 8 mg via ORAL
  Filled 2014-11-26: qty 2

## 2014-11-27 ENCOUNTER — Inpatient Hospital Stay: Payer: Medicare Other

## 2014-11-27 VITALS — BP 103/61 | HR 93 | Temp 96.1°F | Resp 18

## 2014-11-27 DIAGNOSIS — C9200 Acute myeloblastic leukemia, not having achieved remission: Secondary | ICD-10-CM

## 2014-11-27 MED ORDER — AZACITIDINE CHEMO SQ INJECTION
75.0000 mg/m2 | Freq: Once | INTRAMUSCULAR | Status: AC
  Administered 2014-11-27: 112.5 mg via SUBCUTANEOUS
  Filled 2014-11-27: qty 4.5

## 2014-11-27 MED ORDER — ONDANSETRON HCL 4 MG PO TABS
8.0000 mg | ORAL_TABLET | Freq: Once | ORAL | Status: AC
  Administered 2014-11-27: 8 mg via ORAL
  Filled 2014-11-27: qty 2

## 2014-11-28 ENCOUNTER — Inpatient Hospital Stay (HOSPITAL_BASED_OUTPATIENT_CLINIC_OR_DEPARTMENT_OTHER): Payer: Medicare Other | Admitting: Family Medicine

## 2014-11-28 ENCOUNTER — Inpatient Hospital Stay: Payer: Medicare Other

## 2014-11-28 ENCOUNTER — Other Ambulatory Visit: Payer: Self-pay | Admitting: Family Medicine

## 2014-11-28 DIAGNOSIS — D630 Anemia in neoplastic disease: Secondary | ICD-10-CM | POA: Diagnosis not present

## 2014-11-28 DIAGNOSIS — C9200 Acute myeloblastic leukemia, not having achieved remission: Secondary | ICD-10-CM | POA: Diagnosis not present

## 2014-11-28 DIAGNOSIS — E039 Hypothyroidism, unspecified: Secondary | ICD-10-CM

## 2014-11-28 DIAGNOSIS — Z950 Presence of cardiac pacemaker: Secondary | ICD-10-CM

## 2014-11-28 DIAGNOSIS — E780 Pure hypercholesterolemia: Secondary | ICD-10-CM

## 2014-11-28 DIAGNOSIS — Z87891 Personal history of nicotine dependence: Secondary | ICD-10-CM | POA: Diagnosis not present

## 2014-11-28 DIAGNOSIS — Z9889 Other specified postprocedural states: Secondary | ICD-10-CM

## 2014-11-28 DIAGNOSIS — I25810 Atherosclerosis of coronary artery bypass graft(s) without angina pectoris: Secondary | ICD-10-CM

## 2014-11-28 DIAGNOSIS — K219 Gastro-esophageal reflux disease without esophagitis: Secondary | ICD-10-CM

## 2014-11-28 DIAGNOSIS — G20 Parkinson's disease: Secondary | ICD-10-CM

## 2014-11-28 DIAGNOSIS — E119 Type 2 diabetes mellitus without complications: Secondary | ICD-10-CM

## 2014-11-28 DIAGNOSIS — I10 Essential (primary) hypertension: Secondary | ICD-10-CM

## 2014-11-28 DIAGNOSIS — R6510 Systemic inflammatory response syndrome (SIRS) of non-infectious origin without acute organ dysfunction: Secondary | ICD-10-CM

## 2014-11-28 LAB — URINALYSIS COMPLETE WITH MICROSCOPIC (ARMC ONLY)
Bilirubin Urine: NEGATIVE
GLUCOSE, UA: NEGATIVE mg/dL
Hgb urine dipstick: NEGATIVE
Ketones, ur: NEGATIVE mg/dL
LEUKOCYTES UA: NEGATIVE
Nitrite: NEGATIVE
Protein, ur: NEGATIVE mg/dL
RBC / HPF: NONE SEEN RBC/hpf (ref 0–5)
Specific Gravity, Urine: 1.014 (ref 1.005–1.030)
pH: 5 (ref 5.0–8.0)

## 2014-11-28 LAB — COMPREHENSIVE METABOLIC PANEL
ALK PHOS: 70 U/L (ref 38–126)
AST: 11 U/L — ABNORMAL LOW (ref 15–41)
Albumin: 3.6 g/dL (ref 3.5–5.0)
Anion gap: 4 — ABNORMAL LOW (ref 5–15)
BUN: 20 mg/dL (ref 6–20)
CALCIUM: 10.2 mg/dL (ref 8.9–10.3)
CO2: 25 mmol/L (ref 22–32)
CREATININE: 1.31 mg/dL — AB (ref 0.44–1.00)
Chloride: 106 mmol/L (ref 101–111)
GFR calc Af Amer: 44 mL/min — ABNORMAL LOW (ref >60)
GFR calc non Af Amer: 38 mL/min — ABNORMAL LOW (ref >60)
GLUCOSE: 145 mg/dL — AB (ref 65–99)
Potassium: 4.4 mmol/L (ref 3.5–5.1)
Sodium: 135 mmol/L (ref 135–145)
Total Bilirubin: 0.5 mg/dL (ref 0.3–1.2)
Total Protein: 6.9 g/dL (ref 6.5–8.1)

## 2014-11-28 LAB — CBC WITH DIFFERENTIAL/PLATELET
Basophils Absolute: 0 10*3/uL (ref 0–0.1)
Basophils Relative: 0 %
Eosinophils Absolute: 0.1 10*3/uL (ref 0–0.7)
Eosinophils Relative: 7 %
HCT: 22 % — ABNORMAL LOW (ref 35.0–47.0)
Hemoglobin: 7 g/dL — ABNORMAL LOW (ref 12.0–16.0)
Lymphs Abs: 0.6 10*3/uL — ABNORMAL LOW (ref 1.0–3.6)
MCH: 30.3 pg (ref 26.0–34.0)
MCHC: 31.8 g/dL — ABNORMAL LOW (ref 32.0–36.0)
MCV: 95 fL (ref 80.0–100.0)
Monocytes Absolute: 0 10*3/uL — ABNORMAL LOW (ref 0.2–0.9)
Monocytes Relative: 1 %
Neutro Abs: 0.3 10*3/uL — ABNORMAL LOW (ref 1.4–6.5)
Platelets: 96 10*3/uL — ABNORMAL LOW (ref 150–440)
RBC: 2.31 MIL/uL — ABNORMAL LOW (ref 3.80–5.20)
RDW: 21.9 % — AB (ref 11.5–14.5)
WBC: 1 10*3/uL — CL (ref 3.6–11.0)

## 2014-11-28 LAB — SAMPLE TO BLOOD BANK

## 2014-11-28 MED ORDER — LEVOFLOXACIN 500 MG PO TABS: 500.0000 mg | ORAL_TABLET | Freq: Every day | ORAL | Status: DC

## 2014-11-28 NOTE — Progress Notes (Addendum)
[place]  [phone]) [phone]  Fax:(336) [place] DOB: 03/17/1936  MR#: [id number]  XTG#:[id number]  Patient Care Team: Adin Hector, MD as PCP - General (Internal Medicine)  CHIEF COMPLAINT:   AML  INTERVAL HISTORY:  Patient is here today in follow-up regarding weakness , fatigue. She was evaluated by her primary care provider  on 11/28/2014, [name] , and noted to be anemic with a hemoglobin of less than 7. She was treated with cycle 2 day 7 days on on 11/27/2014.  She is being evaluated today to obtain another CBC , type and crossmatch in preparation for 2 units of blood.  Patient is in wheelchair today appears very lethargic. She does awaken and answer questions appropriately but then lays her head back down on her arm.  REVIEW OF SYSTEMS:   Review of Systems  Constitutional: Positive for malaise/fatigue. Negative for fever, chills, weight loss and diaphoresis.  HENT: Negative for congestion, ear discharge, ear pain, hearing loss, nosebleeds, sore throat and tinnitus.   Eyes: Negative for blurred vision, double vision, photophobia, pain, discharge and redness.  Respiratory: Negative for cough, hemoptysis, sputum production, shortness of breath, wheezing and stridor.   Cardiovascular: Negative for chest pain, palpitations, orthopnea, claudication, leg swelling and PND.  Gastrointestinal: Negative for heartburn, nausea, vomiting, abdominal pain, diarrhea, constipation, blood in stool and melena.  Genitourinary: Negative.   Musculoskeletal: Negative.   Skin: Negative.   Neurological: Positive for weakness. Negative for dizziness, tingling, focal weakness, seizures and headaches.  Endo/Heme/Allergies: Does not bruise/bleed easily.  Psychiatric/Behavioral: Negative for depression. The patient is not nervous/anxious and does not have insomnia.     As per HPI. Otherwise, a complete review of systems is negatve.  ONCOLOGY HISTORY:  No  history exists.    PAST MEDICAL HISTORY: Past Medical History  Diagnosis Date  . Atrial fibrillation   . Parkinson's disease   . Glaucoma   . Hypertension   . Hypothyroidism   . GERD (gastroesophageal reflux disease)   . High cholesterol   . Diabetes mellitus without complication   . CAD (coronary artery disease) of bypass graft   . History of appendectomy   . H/O: hysterectomy   . Leukemia     PAST SURGICAL HISTORY: Past Surgical History  Procedure Laterality Date  . Cardiac valve replacement    . Pacemaker insertion      FAMILY HISTORY Family History  Problem Relation Age of Onset  . CAD Other   . Heart disease Father     GYNECOLOGIC HISTORY:  No LMP recorded. Patient is postmenopausal.     ADVANCED DIRECTIVES:    HEALTH MAINTENANCE: History  Substance Use Topics  . Smoking status: Former Smoker -- 1.00 packs/day for 10 years    Types: Cigarettes  . Smokeless tobacco: Not on file  . Alcohol Use: No     Colonoscopy:  PAP:  Bone density:  Lipid panel:  Allergies  Allergen Reactions  . Hydrochlorothiazide Other (See Comments)    Worsening hypercalcemia Worsening hypercalcemia Reaction:  Unknown   . Lisinopril Cough and Other (See Comments)    Worsening renal insufficiency at 40mg  dose Other reaction(s): Other (See Comments) Worsening renal insufficiency at 40mg  dose  . Losartan Other (See Comments)    Worsened renal insufficiency Other reaction(s): Other (See Comments) Worsened renal insufficiency Reaction:  Unknown     Current Outpatient Prescriptions  Medication Sig Dispense Refill  . acetaminophen (TYLENOL) 325 MG tablet  Take 650 mg by mouth every 4 (four) hours as needed for mild pain or headache.    . allopurinol (ZYLOPRIM) 300 MG tablet Take 300 mg by mouth daily.    Marland Kitchen amLODipine (NORVASC) 5 MG tablet Take 10 mg by mouth daily.    . brimonidine (ALPHAGAN) 0.15 % ophthalmic solution Place 1 drop into both eyes 2 (two) times daily.    .  carbidopa-levodopa (SINEMET IR) 25-100 MG per tablet Take 1 tablet by mouth 3 (three) times daily.    . cholecalciferol (VITAMIN D) 1000 UNITS tablet Take 1,000 Units by mouth daily.    Marland Kitchen docusate sodium (COLACE) 100 MG capsule Take 1 capsule (100 mg total) by mouth 2 (two) times daily. 10 capsule 0  . enoxaparin (LOVENOX) 60 MG/0.6ML injection Inject 60 mg into the skin daily.    . ferrous sulfate 325 (65 FE) MG tablet Take 325 mg by mouth daily with breakfast.    . glipiZIDE (GLUCOTROL XL) 5 MG 24 hr tablet Take 5 mg by mouth daily.    Marland Kitchen latanoprost (XALATAN) 0.005 % ophthalmic solution Place 1 drop into both eyes at bedtime.    Marland Kitchen levofloxacin (LEVAQUIN) 500 MG tablet Take 1 tablet (500 mg total) by mouth daily. 7 tablet 0  . levothyroxine (SYNTHROID, LEVOTHROID) 100 MCG tablet Take 100 mcg by mouth daily before breakfast.    . losartan (COZAAR) 100 MG tablet Take 100 mg by mouth daily.    Marland Kitchen omeprazole (PRILOSEC) 40 MG capsule Take 40 mg by mouth 2 (two) times daily.    . prochlorperazine (COMPAZINE) 10 MG tablet Take 1 tablet (10 mg total) by mouth every 6 (six) hours as needed (Nausea or vomiting). 30 tablet 1  . simvastatin (ZOCOR) 20 MG tablet Take 20 mg by mouth at bedtime.    . sotalol (BETAPACE) 80 MG tablet Take 80 mg by mouth 2 (two) times daily.    . vitamin B-12 (CYANOCOBALAMIN) 250 MCG tablet Take 250 mcg by mouth daily.     No current facility-administered medications for this visit.   Facility-Administered Medications Ordered in Other Visits  Medication Dose Route Frequency Provider Last Rate Last Dose  . 0.9 %  sodium chloride infusion   Intravenous Continuous Lloyd Huger, MD 999 mL/hr at 10/29/14 1550      OBJECTIVE: There were no vitals taken for this visit.   There is no weight on file to calculate BMI.    ECOG FS:3 - Symptomatic, >50% confined to bed  General: Well-developed, well-nourished,  Lethargic , in wheelchair. Eyes: Pink conjunctiva, anicteric  sclera. HEENT: Normocephalic, moist mucous membranes, clear oropharnyx. Lungs: Clear to auscultation bilaterally. Heart: Regular rate and rhythm. No rubs, murmurs, or gallops. Abdomen: Soft, nontender, nondistended. No organomegaly noted, normoactive bowel sounds. Musculoskeletal: No edema, cyanosis, or clubbing. Neuro: Alert, answering all questions appropriately. Cranial nerves grossly intact. Skin: No rashes or petechiae noted. Psych: Normal affect. Lymphatics: No cervical, calvicular, axillary or inguinal LAD.   LAB RESULTS:     Component Value Date/Time   NA 133* 12/05/2014 1337   NA 140 06/18/2014 0515   K 4.4 12/05/2014 1337   K 4.1 06/18/2014 0515   CL 107 12/05/2014 1337   CL 107 06/18/2014 0515   CO2 23 12/05/2014 1337   CO2 25 06/18/2014 0515   GLUCOSE 107* 12/05/2014 1337   GLUCOSE 93 06/18/2014 0515   BUN 19 12/05/2014 1337   BUN 13 06/18/2014 0515   CREATININE 1.13* 12/05/2014 1337  CREATININE 1.06 06/18/2014 0515   CALCIUM 9.5 12/05/2014 1337   CALCIUM 9.1 06/18/2014 0515   PROT 6.9 11/28/2014 1501   PROT 6.3* 09/19/2013 0533   ALBUMIN 3.6 11/28/2014 1501   ALBUMIN 2.6* 09/19/2013 0533   AST 11* 11/28/2014 1501   AST 23 09/19/2013 0533   ALT <5* 11/28/2014 1501   ALT 33 09/19/2013 0533   ALKPHOS 70 11/28/2014 1501   ALKPHOS 81 09/19/2013 0533   BILITOT 0.5 11/28/2014 1501   GFRNONAA 46* 12/05/2014 1337   GFRNONAA 48* 10/18/2013 0203   GFRAA 53* 12/05/2014 1337   GFRAA 56* 10/18/2013 0203    No results found for: SPEP, UPEP  Lab Results  Component Value Date   WBC 0.6* 12/05/2014   NEUTROABS 0.1* 12/05/2014   HGB 8.5* 12/05/2014   HCT 26.5* 12/05/2014   MCV 91.0 12/05/2014   PLT 61* 12/05/2014      Chemistry      Component Value Date/Time   NA 133* 12/05/2014 1337   NA 140 06/18/2014 0515   K 4.4 12/05/2014 1337   K 4.1 06/18/2014 0515   CL 107 12/05/2014 1337   CL 107 06/18/2014 0515   CO2 23 12/05/2014 1337   CO2 25 06/18/2014  0515   BUN 19 12/05/2014 1337   BUN 13 06/18/2014 0515   CREATININE 1.13* 12/05/2014 1337   CREATININE 1.06 06/18/2014 0515      Component Value Date/Time   CALCIUM 9.5 12/05/2014 1337   CALCIUM 9.1 06/18/2014 0515   ALKPHOS 70 11/28/2014 1501   ALKPHOS 81 09/19/2013 0533   AST 11* 11/28/2014 1501   AST 23 09/19/2013 0533   ALT <5* 11/28/2014 1501   ALT 33 09/19/2013 0533   BILITOT 0.5 11/28/2014 1501       No results found for: LABCA2  No components found for: ZOXWR604  No results for input(s): INR in the last 168 hours.     Component Value Date/Time   COLORURINE YELLOW* 11/28/2014 1647   APPEARANCEUR CLEAR* 11/28/2014 1647   LABSPEC 1.014 11/28/2014 1647   PHURINE 5.0 11/28/2014 1647   GLUCOSEU NEGATIVE 11/28/2014 1647   HGBUR NEGATIVE 11/28/2014 1647   BILIRUBINUR NEGATIVE 11/28/2014 1647   KETONESUR NEGATIVE 11/28/2014 1647   PROTEINUR NEGATIVE 11/28/2014 1647   NITRITE NEGATIVE 11/28/2014 1647   LEUKOCYTESUR NEGATIVE 11/28/2014 1647    STUDIES: No results found.  ASSESSMENT:   AML  PLAN:    1. AML. Patient noted to be anemic in primary care provider's office on 11/28/2014. She last received cycle 2 day 7 of  Vidaza 11/27/2014.  We'll prepare patient for 2 units of packed red blood cells tomorrow 11/29/2014. Instructions were given in regards to risk versus benefits, side effects, and expected outcomes.  Patient is agreeable to proceed with transfusion as his husband.   patient to return tomorrow morning for transfusion.  Patient expressed understanding and was in agreement with this plan. She also understands that She can call clinic at any time with any questions, concerns, or complaints.    Dr. Oliva Bustard was available for consultation and review of plan of care for this patient.  Evlyn Kanner, NP 11/28/2014 10:26 AM

## 2014-11-29 ENCOUNTER — Inpatient Hospital Stay: Payer: Medicare Other

## 2014-11-29 VITALS — BP 134/80 | HR 100 | Temp 96.9°F | Resp 20

## 2014-11-29 DIAGNOSIS — R6510 Systemic inflammatory response syndrome (SIRS) of non-infectious origin without acute organ dysfunction: Secondary | ICD-10-CM

## 2014-11-29 DIAGNOSIS — C9200 Acute myeloblastic leukemia, not having achieved remission: Secondary | ICD-10-CM

## 2014-11-29 LAB — PREPARE RBC (CROSSMATCH)

## 2014-11-29 MED ORDER — DIPHENHYDRAMINE HCL 50 MG/ML IJ SOLN
25.0000 mg | Freq: Once | INTRAMUSCULAR | Status: AC
  Administered 2014-11-29: 25 mg via INTRAVENOUS
  Filled 2014-11-29: qty 1

## 2014-11-29 MED ORDER — ACETAMINOPHEN 325 MG PO TABS
650.0000 mg | ORAL_TABLET | Freq: Once | ORAL | Status: AC
  Administered 2014-11-29: 650 mg via ORAL
  Filled 2014-11-29: qty 2

## 2014-11-29 MED ORDER — SODIUM CHLORIDE 0.9 % IV SOLN
250.0000 mL | Freq: Once | INTRAVENOUS | Status: AC
  Administered 2014-11-29: 250 mL via INTRAVENOUS
  Filled 2014-11-29: qty 250

## 2014-11-29 MED ORDER — SODIUM CHLORIDE 0.9 % IJ SOLN
10.0000 mL | INTRAMUSCULAR | Status: DC | PRN
  Filled 2014-11-29: qty 10

## 2014-11-30 LAB — TYPE AND SCREEN
ABO/RH(D): O POS
ANTIBODY SCREEN: NEGATIVE
UNIT DIVISION: 0
Unit division: 0

## 2014-11-30 LAB — URINE CULTURE

## 2014-12-03 ENCOUNTER — Other Ambulatory Visit: Payer: Self-pay | Admitting: Oncology

## 2014-12-03 LAB — CULTURE, BLOOD (ROUTINE X 2)
Culture: NO GROWTH
Culture: NO GROWTH

## 2014-12-05 ENCOUNTER — Inpatient Hospital Stay (HOSPITAL_BASED_OUTPATIENT_CLINIC_OR_DEPARTMENT_OTHER): Payer: Medicare Other | Admitting: Oncology

## 2014-12-05 ENCOUNTER — Inpatient Hospital Stay: Payer: Medicare Other

## 2014-12-05 VITALS — BP 117/71 | HR 102 | Temp 96.5°F | Resp 20 | Wt 117.1 lb

## 2014-12-05 DIAGNOSIS — Z9889 Other specified postprocedural states: Secondary | ICD-10-CM

## 2014-12-05 DIAGNOSIS — C9200 Acute myeloblastic leukemia, not having achieved remission: Secondary | ICD-10-CM | POA: Diagnosis not present

## 2014-12-05 DIAGNOSIS — D630 Anemia in neoplastic disease: Secondary | ICD-10-CM | POA: Diagnosis not present

## 2014-12-05 DIAGNOSIS — E119 Type 2 diabetes mellitus without complications: Secondary | ICD-10-CM

## 2014-12-05 DIAGNOSIS — Z87891 Personal history of nicotine dependence: Secondary | ICD-10-CM | POA: Diagnosis not present

## 2014-12-05 DIAGNOSIS — E780 Pure hypercholesterolemia: Secondary | ICD-10-CM

## 2014-12-05 DIAGNOSIS — G20 Parkinson's disease: Secondary | ICD-10-CM

## 2014-12-05 DIAGNOSIS — I4891 Unspecified atrial fibrillation: Secondary | ICD-10-CM | POA: Diagnosis not present

## 2014-12-05 DIAGNOSIS — K219 Gastro-esophageal reflux disease without esophagitis: Secondary | ICD-10-CM

## 2014-12-05 DIAGNOSIS — I25810 Atherosclerosis of coronary artery bypass graft(s) without angina pectoris: Secondary | ICD-10-CM

## 2014-12-05 DIAGNOSIS — E039 Hypothyroidism, unspecified: Secondary | ICD-10-CM

## 2014-12-05 DIAGNOSIS — I10 Essential (primary) hypertension: Secondary | ICD-10-CM

## 2014-12-05 LAB — CBC WITH DIFFERENTIAL/PLATELET
Basophils Absolute: 0 10*3/uL (ref 0–0.1)
Eosinophils Absolute: 0.1 10*3/uL (ref 0–0.7)
HCT: 26.5 % — ABNORMAL LOW (ref 35.0–47.0)
Hemoglobin: 8.5 g/dL — ABNORMAL LOW (ref 12.0–16.0)
Lymphocytes Relative: 75 %
Lymphs Abs: 0.4 10*3/uL — ABNORMAL LOW (ref 1.0–3.6)
MCH: 29.3 pg (ref 26.0–34.0)
MCHC: 32.2 g/dL (ref 32.0–36.0)
MCV: 91 fL (ref 80.0–100.0)
MONO ABS: 0 10*3/uL — AB (ref 0.2–0.9)
Monocytes Relative: 1 %
Neutro Abs: 0.1 10*3/uL — ABNORMAL LOW (ref 1.4–6.5)
Neutrophils Relative %: 12 %
PLATELETS: 61 10*3/uL — AB (ref 150–440)
RBC: 2.91 MIL/uL — ABNORMAL LOW (ref 3.80–5.20)
RDW: 18.9 % — AB (ref 11.5–14.5)
WBC: 0.6 10*3/uL — AB (ref 3.6–11.0)

## 2014-12-05 LAB — BASIC METABOLIC PANEL
Anion gap: 3 — ABNORMAL LOW (ref 5–15)
BUN: 19 mg/dL (ref 6–20)
CALCIUM: 9.5 mg/dL (ref 8.9–10.3)
CO2: 23 mmol/L (ref 22–32)
Chloride: 107 mmol/L (ref 101–111)
Creatinine, Ser: 1.13 mg/dL — ABNORMAL HIGH (ref 0.44–1.00)
GFR calc Af Amer: 53 mL/min — ABNORMAL LOW (ref >60)
GFR, EST NON AFRICAN AMERICAN: 46 mL/min — AB (ref >60)
Glucose, Bld: 107 mg/dL — ABNORMAL HIGH (ref 65–99)
Potassium: 4.4 mmol/L (ref 3.5–5.1)
Sodium: 133 mmol/L — ABNORMAL LOW (ref 135–145)

## 2014-12-14 NOTE — Progress Notes (Signed)
[facility]  [phone] [phone]  ID: [name] OB: 07/27/1935  MR#: [id number]  CSN#:[id number]  Patient Care Team: Bert J Klein III, MD as PCP - General (Internal Medicine)  CHIEF COMPLAINT:  Chief Complaint  Patient presents with  . Follow-up    AML    INTERVAL HISTORY: Patient returns to clinic today for further evaluation and treatment planning for her next infusion of Vidaza. She continues to feel weak and fatigued, but improved since receiving a blood transfusion approximately one week ago. She denies any fevers. She has no neurologic complaints. She has a fair appetite. She denies any chest pain or shortness of breath. She has no nausea, vomiting, constipation, or diarrhea. Patient offers no further specific complaints.  REVIEW OF SYSTEMS:   Review of Systems  Constitutional: Positive for malaise/fatigue. Negative for fever.  Respiratory: Negative.   Cardiovascular: Negative.   Neurological: Positive for weakness.    As per HPI. Otherwise, a complete review of systems is negatve.  PAST MEDICAL HISTORY: Past Medical History  Diagnosis Date  . Atrial fibrillation   . Parkinson's disease   . Glaucoma   . Hypertension   . Hypothyroidism   . GERD (gastroesophageal reflux disease)   . High cholesterol   . Diabetes mellitus without complication   . CAD (coronary artery disease) of bypass graft   . History of appendectomy   . H/O: hysterectomy   . Leukemia     PAST SURGICAL HISTORY: Past Surgical History  Procedure Laterality Date  . Cardiac valve replacement    . Pacemaker insertion      FAMILY HISTORY Family History  Problem Relation Age of Onset  . CAD Other   . Heart disease Father        ADVANCED DIRECTIVES:    HEALTH MAINTENANCE: History  Substance Use Topics  . Smoking status: Former Smoker -- 1.00 packs/day for 10 years    Types: Cigarettes  . Smokeless tobacco: Not on file  . Alcohol Use: No       Colonoscopy:  PAP:  Bone density:  Lipid panel:  Allergies  Allergen Reactions  . Hydrochlorothiazide Other (See Comments)    Worsening hypercalcemia Worsening hypercalcemia Reaction:  Unknown   . Lisinopril Cough and Other (See Comments)    Worsening renal insufficiency at 40mg dose Other reaction(s): Other (See Comments) Worsening renal insufficiency at 40mg dose  . Losartan Other (See Comments)    Worsened renal insufficiency Other reaction(s): Other (See Comments) Worsened renal insufficiency Reaction:  Unknown     Current Outpatient Prescriptions  Medication Sig Dispense Refill  . acetaminophen (TYLENOL) 325 MG tablet Take 650 mg by mouth every 4 (four) hours as needed for mild pain or headache.    . allopurinol (ZYLOPRIM) 300 MG tablet Take 300 mg by mouth daily.    . amLODipine (NORVASC) 5 MG tablet Take 10 mg by mouth daily.    . brimonidine (ALPHAGAN) 0.15 % ophthalmic solution Place 1 drop into both eyes 2 (two) times daily.    . carbidopa-levodopa (SINEMET IR) 25-100 MG per tablet Take 1 tablet by mouth 3 (three) times daily.    . cholecalciferol (VITAMIN D) 1000 UNITS tablet Take 1,000 Units by mouth daily.    . docusate sodium (COLACE) 100 MG capsule Take 1 capsule (100 mg total) by mouth 2 (two) times daily. 10 capsule 0  . enoxaparin (LOVENOX) 60 MG/0.6ML injection Inject 60 mg into the skin daily.    . ferrous   sulfate 325 (65 FE) MG tablet Take 325 mg by mouth daily with breakfast.    . glipiZIDE (GLUCOTROL XL) 5 MG 24 hr tablet Take 5 mg by mouth daily.    Marland Kitchen latanoprost (XALATAN) 0.005 % ophthalmic solution Place 1 drop into both eyes at bedtime.    Marland Kitchen levofloxacin (LEVAQUIN) 500 MG tablet Take 1 tablet (500 mg total) by mouth daily. 7 tablet 0  . levothyroxine (SYNTHROID, LEVOTHROID) 100 MCG tablet Take 100 mcg by mouth daily before breakfast.    . losartan (COZAAR) 100 MG tablet Take 100 mg by mouth daily.    Marland Kitchen omeprazole (PRILOSEC) 40 MG capsule Take 40  mg by mouth 2 (two) times daily.    . prochlorperazine (COMPAZINE) 10 MG tablet Take 1 tablet (10 mg total) by mouth every 6 (six) hours as needed (Nausea or vomiting). 30 tablet 1  . simvastatin (ZOCOR) 20 MG tablet Take 20 mg by mouth at bedtime.    . sotalol (BETAPACE) 80 MG tablet Take 80 mg by mouth 2 (two) times daily.    . vitamin B-12 (CYANOCOBALAMIN) 250 MCG tablet Take 250 mcg by mouth daily.     No current facility-administered medications for this visit.   Facility-Administered Medications Ordered in Other Visits  Medication Dose Route Frequency Provider Last Rate Last Dose  . 0.9 %  sodium chloride infusion   Intravenous Continuous Lloyd Huger, MD 999 mL/hr at 10/29/14 1550      OBJECTIVE: Filed Vitals:   12/05/14 1410  BP: 117/71  Pulse: 102  Temp: 96.5 F (35.8 C)  Resp: 20     Body mass index is 21.41 kg/(m^2).    ECOG FS:2 - Symptomatic, <50% confined to bed  General: Well-developed, well-nourished, no acute distress. Eyes: anicteric sclera. Lungs: Clear to auscultation bilaterally. Heart: Regular rate and rhythm. No rubs, murmurs, or gallops. Abdomen: Soft, nontender, nondistended. No organomegaly noted, normoactive bowel sounds. Musculoskeletal: No edema, cyanosis, or clubbing. Neuro: Alert, answering all questions appropriately. Cranial nerves grossly intact. Skin: No rashes or petechiae noted. Psych: Normal affect.   LAB RESULTS:  Lab Results  Component Value Date   NA 133* 12/05/2014   K 4.4 12/05/2014   CL 107 12/05/2014   CO2 23 12/05/2014   GLUCOSE 107* 12/05/2014   BUN 19 12/05/2014   CREATININE 1.13* 12/05/2014   CALCIUM 9.5 12/05/2014   PROT 6.9 11/28/2014   ALBUMIN 3.6 11/28/2014   AST 11* 11/28/2014   ALT <5* 11/28/2014   ALKPHOS 70 11/28/2014   BILITOT 0.5 11/28/2014   GFRNONAA 46* 12/05/2014   GFRAA 53* 12/05/2014    Lab Results  Component Value Date   WBC 0.6* 12/05/2014   NEUTROABS 0.1* 12/05/2014   HGB 8.5*  12/05/2014   HCT 26.5* 12/05/2014   MCV 91.0 12/05/2014   PLT 61* 12/05/2014     STUDIES: No results found.  ASSESSMENT: AML  PLAN:    1. AML: Hospice and comfort care were previously discussed with the patient, but she wishes to pursue additional treatment at this time. Bone marrow biopsy was improved, but still contained AML at approximately 19% of her sample. She has now completed 4 cycles of azacitidine 75 mg/m IV today. She will receive this treatment on days 1 through 5 as well as on day 8 and 9. She has an appointment at [facility] in approximately one week and then return to clinic on December 17, 2014 for consideration of cycle 5. 2. Pancytopenia: Secondary to AML as  well as chemotherapy. Monitor.  Patient expressed understanding and was in agreement with this plan. She also understands that She can call clinic at any time with any questions, concerns, or complaints.   Timothy J Finnegan, MD   12/14/2014 9:05 AM     

## 2014-12-17 ENCOUNTER — Inpatient Hospital Stay (HOSPITAL_BASED_OUTPATIENT_CLINIC_OR_DEPARTMENT_OTHER): Payer: Medicare Other | Admitting: Oncology

## 2014-12-17 ENCOUNTER — Telehealth: Payer: Self-pay | Admitting: *Deleted

## 2014-12-17 ENCOUNTER — Inpatient Hospital Stay: Payer: Medicare Other

## 2014-12-17 ENCOUNTER — Inpatient Hospital Stay: Payer: Medicare Other | Attending: Oncology

## 2014-12-17 VITALS — Temp 97.1°F

## 2014-12-17 DIAGNOSIS — R531 Weakness: Secondary | ICD-10-CM

## 2014-12-17 DIAGNOSIS — I10 Essential (primary) hypertension: Secondary | ICD-10-CM | POA: Diagnosis not present

## 2014-12-17 DIAGNOSIS — I4891 Unspecified atrial fibrillation: Secondary | ICD-10-CM | POA: Insufficient documentation

## 2014-12-17 DIAGNOSIS — R5381 Other malaise: Secondary | ICD-10-CM | POA: Insufficient documentation

## 2014-12-17 DIAGNOSIS — I2510 Atherosclerotic heart disease of native coronary artery without angina pectoris: Secondary | ICD-10-CM | POA: Insufficient documentation

## 2014-12-17 DIAGNOSIS — G620 Drug-induced polyneuropathy: Secondary | ICD-10-CM

## 2014-12-17 DIAGNOSIS — Z950 Presence of cardiac pacemaker: Secondary | ICD-10-CM | POA: Diagnosis not present

## 2014-12-17 DIAGNOSIS — Z951 Presence of aortocoronary bypass graft: Secondary | ICD-10-CM | POA: Diagnosis not present

## 2014-12-17 DIAGNOSIS — Z87891 Personal history of nicotine dependence: Secondary | ICD-10-CM | POA: Insufficient documentation

## 2014-12-17 DIAGNOSIS — D61810 Antineoplastic chemotherapy induced pancytopenia: Secondary | ICD-10-CM | POA: Insufficient documentation

## 2014-12-17 DIAGNOSIS — C9200 Acute myeloblastic leukemia, not having achieved remission: Secondary | ICD-10-CM

## 2014-12-17 DIAGNOSIS — D619 Aplastic anemia, unspecified: Secondary | ICD-10-CM

## 2014-12-17 DIAGNOSIS — R5383 Other fatigue: Secondary | ICD-10-CM | POA: Diagnosis not present

## 2014-12-17 DIAGNOSIS — E119 Type 2 diabetes mellitus without complications: Secondary | ICD-10-CM | POA: Insufficient documentation

## 2014-12-17 DIAGNOSIS — E039 Hypothyroidism, unspecified: Secondary | ICD-10-CM | POA: Insufficient documentation

## 2014-12-17 DIAGNOSIS — Z794 Long term (current) use of insulin: Secondary | ICD-10-CM | POA: Diagnosis not present

## 2014-12-17 DIAGNOSIS — Z79899 Other long term (current) drug therapy: Secondary | ICD-10-CM | POA: Insufficient documentation

## 2014-12-17 LAB — BASIC METABOLIC PANEL
Anion gap: 4 — ABNORMAL LOW (ref 5–15)
BUN: 11 mg/dL (ref 6–20)
CO2: 24 mmol/L (ref 22–32)
Calcium: 9.1 mg/dL (ref 8.9–10.3)
Chloride: 105 mmol/L (ref 101–111)
Creatinine, Ser: 1.06 mg/dL — ABNORMAL HIGH (ref 0.44–1.00)
GFR calc Af Amer: 57 mL/min — ABNORMAL LOW (ref >60)
GFR calc non Af Amer: 49 mL/min — ABNORMAL LOW (ref >60)
Glucose, Bld: 159 mg/dL — ABNORMAL HIGH (ref 65–99)
Potassium: 3.6 mmol/L (ref 3.5–5.1)
Sodium: 133 mmol/L — ABNORMAL LOW (ref 135–145)

## 2014-12-17 LAB — CBC WITH DIFFERENTIAL/PLATELET
Basophils Absolute: 0 10*3/uL (ref 0–0.1)
Basophils Relative: 1 %
Eosinophils Absolute: 0 10*3/uL (ref 0–0.7)
Eosinophils Relative: 5 %
HEMATOCRIT: 22.5 % — AB (ref 35.0–47.0)
Hemoglobin: 7.3 g/dL — ABNORMAL LOW (ref 12.0–16.0)
Lymphocytes Relative: 69 %
Lymphs Abs: 0.3 10*3/uL — ABNORMAL LOW (ref 1.0–3.6)
MCH: 29.8 pg (ref 26.0–34.0)
MCHC: 32.4 g/dL (ref 32.0–36.0)
MCV: 92 fL (ref 80.0–100.0)
Monocytes Absolute: 0 10*3/uL — ABNORMAL LOW (ref 0.2–0.9)
Neutro Abs: 0.1 10*3/uL — ABNORMAL LOW (ref 1.4–6.5)
Platelets: 271 10*3/uL (ref 150–440)
RBC: 2.44 MIL/uL — ABNORMAL LOW (ref 3.80–5.20)
RDW: 20.5 % — ABNORMAL HIGH (ref 11.5–14.5)
WBC: 0.4 10*3/uL — CL (ref 3.6–11.0)

## 2014-12-19 ENCOUNTER — Inpatient Hospital Stay: Payer: Medicare Other

## 2014-12-19 VITALS — BP 157/90 | HR 84 | Temp 95.0°F | Resp 18

## 2014-12-19 DIAGNOSIS — C9200 Acute myeloblastic leukemia, not having achieved remission: Secondary | ICD-10-CM

## 2014-12-19 LAB — PREPARE RBC (CROSSMATCH)

## 2014-12-19 MED ORDER — DIPHENHYDRAMINE HCL 25 MG PO TABS
25.0000 mg | ORAL_TABLET | Freq: Once | ORAL | Status: AC
  Administered 2014-12-19: 25 mg via ORAL
  Filled 2014-12-19: qty 1

## 2014-12-19 MED ORDER — SODIUM CHLORIDE 0.9 % IV SOLN
INTRAVENOUS | Status: DC
  Administered 2014-12-19: 10:00:00 via INTRAVENOUS
  Filled 2014-12-19: qty 1000

## 2014-12-19 MED ORDER — SODIUM CHLORIDE 0.9 % IJ SOLN
10.0000 mL | INTRAMUSCULAR | Status: DC | PRN
  Filled 2014-12-19: qty 10

## 2014-12-19 MED ORDER — ACETAMINOPHEN 325 MG PO TABS
650.0000 mg | ORAL_TABLET | Freq: Once | ORAL | Status: AC
  Administered 2014-12-19: 650 mg via ORAL
  Filled 2014-12-19: qty 2

## 2014-12-19 MED ORDER — HEPARIN SOD (PORK) LOCK FLUSH 100 UNIT/ML IV SOLN
250.0000 [IU] | INTRAVENOUS | Status: DC | PRN
  Filled 2014-12-19: qty 5

## 2014-12-19 MED ORDER — DIPHENHYDRAMINE HCL 25 MG PO CAPS
ORAL_CAPSULE | ORAL | Status: AC
  Filled 2014-12-19: qty 1

## 2014-12-20 LAB — TYPE AND SCREEN
ABO/RH(D): O POS
Antibody Screen: NEGATIVE
Unit division: 0

## 2014-12-21 ENCOUNTER — Other Ambulatory Visit: Payer: Self-pay

## 2014-12-21 ENCOUNTER — Inpatient Hospital Stay
Admission: EM | Admit: 2014-12-21 | Discharge: 2014-12-26 | DRG: 809 | Disposition: A | Discharge status: Home / Self Care | Payer: Medicare Other | Attending: Internal Medicine | Admitting: Internal Medicine

## 2014-12-21 ENCOUNTER — Encounter: Payer: Self-pay | Admitting: Emergency Medicine

## 2014-12-21 DIAGNOSIS — Z7901 Long term (current) use of anticoagulants: Secondary | ICD-10-CM

## 2014-12-21 DIAGNOSIS — D61810 Antineoplastic chemotherapy induced pancytopenia: Principal | ICD-10-CM | POA: Diagnosis present

## 2014-12-21 DIAGNOSIS — R7989 Other specified abnormal findings of blood chemistry: Secondary | ICD-10-CM

## 2014-12-21 DIAGNOSIS — E780 Pure hypercholesterolemia: Secondary | ICD-10-CM | POA: Diagnosis present

## 2014-12-21 DIAGNOSIS — G20 Parkinson's disease: Secondary | ICD-10-CM | POA: Diagnosis present

## 2014-12-21 DIAGNOSIS — T451X5A Adverse effect of antineoplastic and immunosuppressive drugs, initial encounter: Secondary | ICD-10-CM | POA: Diagnosis present

## 2014-12-21 DIAGNOSIS — E039 Hypothyroidism, unspecified: Secondary | ICD-10-CM | POA: Diagnosis present

## 2014-12-21 DIAGNOSIS — Z6820 Body mass index (BMI) 20.0-20.9, adult: Secondary | ICD-10-CM | POA: Diagnosis not present

## 2014-12-21 DIAGNOSIS — Z9049 Acquired absence of other specified parts of digestive tract: Secondary | ICD-10-CM | POA: Diagnosis present

## 2014-12-21 DIAGNOSIS — I509 Heart failure, unspecified: Secondary | ICD-10-CM

## 2014-12-21 DIAGNOSIS — H409 Unspecified glaucoma: Secondary | ICD-10-CM | POA: Diagnosis present

## 2014-12-21 DIAGNOSIS — R778 Other specified abnormalities of plasma proteins: Secondary | ICD-10-CM

## 2014-12-21 DIAGNOSIS — Z950 Presence of cardiac pacemaker: Secondary | ICD-10-CM

## 2014-12-21 DIAGNOSIS — I482 Chronic atrial fibrillation: Secondary | ICD-10-CM | POA: Diagnosis present

## 2014-12-21 DIAGNOSIS — Z66 Do not resuscitate: Secondary | ICD-10-CM | POA: Diagnosis present

## 2014-12-21 DIAGNOSIS — I350 Nonrheumatic aortic (valve) stenosis: Secondary | ICD-10-CM | POA: Diagnosis present

## 2014-12-21 DIAGNOSIS — K219 Gastro-esophageal reflux disease without esophagitis: Secondary | ICD-10-CM | POA: Diagnosis present

## 2014-12-21 DIAGNOSIS — Z8249 Family history of ischemic heart disease and other diseases of the circulatory system: Secondary | ICD-10-CM | POA: Diagnosis not present

## 2014-12-21 DIAGNOSIS — Z952 Presence of prosthetic heart valve: Secondary | ICD-10-CM

## 2014-12-21 DIAGNOSIS — I2510 Atherosclerotic heart disease of native coronary artery without angina pectoris: Secondary | ICD-10-CM | POA: Diagnosis present

## 2014-12-21 DIAGNOSIS — I4891 Unspecified atrial fibrillation: Secondary | ICD-10-CM | POA: Diagnosis present

## 2014-12-21 DIAGNOSIS — C9200 Acute myeloblastic leukemia, not having achieved remission: Secondary | ICD-10-CM | POA: Diagnosis present

## 2014-12-21 DIAGNOSIS — Z888 Allergy status to other drugs, medicaments and biological substances status: Secondary | ICD-10-CM | POA: Diagnosis not present

## 2014-12-21 DIAGNOSIS — I10 Essential (primary) hypertension: Secondary | ICD-10-CM | POA: Diagnosis present

## 2014-12-21 DIAGNOSIS — D61818 Other pancytopenia: Secondary | ICD-10-CM | POA: Diagnosis not present

## 2014-12-21 DIAGNOSIS — R531 Weakness: Secondary | ICD-10-CM

## 2014-12-21 DIAGNOSIS — I495 Sick sinus syndrome: Secondary | ICD-10-CM | POA: Diagnosis present

## 2014-12-21 DIAGNOSIS — Z79899 Other long term (current) drug therapy: Secondary | ICD-10-CM | POA: Diagnosis not present

## 2014-12-21 DIAGNOSIS — I5032 Chronic diastolic (congestive) heart failure: Secondary | ICD-10-CM | POA: Diagnosis present

## 2014-12-21 DIAGNOSIS — Z9221 Personal history of antineoplastic chemotherapy: Secondary | ICD-10-CM | POA: Diagnosis not present

## 2014-12-21 DIAGNOSIS — R9431 Abnormal electrocardiogram [ECG] [EKG]: Secondary | ICD-10-CM | POA: Diagnosis present

## 2014-12-21 DIAGNOSIS — I248 Other forms of acute ischemic heart disease: Secondary | ICD-10-CM | POA: Diagnosis present

## 2014-12-21 DIAGNOSIS — Z90710 Acquired absence of both cervix and uterus: Secondary | ICD-10-CM | POA: Diagnosis not present

## 2014-12-21 DIAGNOSIS — E11649 Type 2 diabetes mellitus with hypoglycemia without coma: Secondary | ICD-10-CM | POA: Diagnosis present

## 2014-12-21 DIAGNOSIS — Z87891 Personal history of nicotine dependence: Secondary | ICD-10-CM | POA: Diagnosis not present

## 2014-12-21 DIAGNOSIS — R5383 Other fatigue: Secondary | ICD-10-CM | POA: Diagnosis not present

## 2014-12-21 DIAGNOSIS — E440 Moderate protein-calorie malnutrition: Secondary | ICD-10-CM | POA: Diagnosis present

## 2014-12-21 LAB — CBC WITH DIFFERENTIAL/PLATELET
Basophils Absolute: 0 10*3/uL (ref 0–0.1)
Basophils Relative: 1 %
EOS ABS: 0 10*3/uL (ref 0–0.7)
Eosinophils Relative: 3 %
HEMATOCRIT: 30 % — AB (ref 35.0–47.0)
Hemoglobin: 10.1 g/dL — ABNORMAL LOW (ref 12.0–16.0)
Lymphs Abs: 0.3 10*3/uL — ABNORMAL LOW (ref 1.0–3.6)
MCH: 30 pg (ref 26.0–34.0)
MCHC: 33.5 g/dL (ref 32.0–36.0)
MCV: 89.4 fL (ref 80.0–100.0)
Monocytes Absolute: 0 10*3/uL — ABNORMAL LOW (ref 0.2–0.9)
Monocytes Relative: 1 %
NEUTROS ABS: 0.2 10*3/uL — AB (ref 1.4–6.5)
Neutrophils Relative %: 44 %
Platelets: 233 10*3/uL (ref 150–440)
RBC: 3.36 MIL/uL — ABNORMAL LOW (ref 3.80–5.20)
RDW: 19 % — ABNORMAL HIGH (ref 11.5–14.5)
WBC: 0.6 10*3/uL — CL (ref 3.6–11.0)

## 2014-12-21 LAB — URINALYSIS COMPLETE WITH MICROSCOPIC (ARMC ONLY)
BILIRUBIN URINE: NEGATIVE
GLUCOSE, UA: NEGATIVE mg/dL
HGB URINE DIPSTICK: NEGATIVE
Nitrite: NEGATIVE
PH: 6 (ref 5.0–8.0)
Protein, ur: NEGATIVE mg/dL
Specific Gravity, Urine: 1.013 (ref 1.005–1.030)

## 2014-12-21 LAB — BASIC METABOLIC PANEL
Anion gap: 10 (ref 5–15)
BUN: 12 mg/dL (ref 6–20)
CALCIUM: 10.2 mg/dL (ref 8.9–10.3)
CO2: 25 mmol/L (ref 22–32)
CREATININE: 1.17 mg/dL — AB (ref 0.44–1.00)
Chloride: 102 mmol/L (ref 101–111)
GFR calc Af Amer: 51 mL/min — ABNORMAL LOW (ref >60)
GFR calc non Af Amer: 44 mL/min — ABNORMAL LOW (ref >60)
Glucose, Bld: 99 mg/dL (ref 65–99)
POTASSIUM: 3.6 mmol/L (ref 3.5–5.1)
Sodium: 137 mmol/L (ref 135–145)

## 2014-12-21 LAB — TSH: TSH: 0.542 u[IU]/mL (ref 0.350–4.500)

## 2014-12-21 LAB — TROPONIN I: TROPONIN I: 0.04 ng/mL — AB (ref <0.031)

## 2014-12-21 MED ORDER — ASPIRIN 81 MG PO CHEW
CHEWABLE_TABLET | ORAL | Status: AC
  Administered 2014-12-21: 324 mg via ORAL
  Filled 2014-12-21: qty 4

## 2014-12-21 MED ORDER — ALLOPURINOL 100 MG PO TABS
300.0000 mg | ORAL_TABLET | Freq: Every day | ORAL | Status: DC
  Administered 2014-12-21 – 2014-12-26 (×6): 300 mg via ORAL
  Filled 2014-12-21 (×6): qty 3

## 2014-12-21 MED ORDER — BISACODYL 5 MG PO TBEC
5.0000 mg | DELAYED_RELEASE_TABLET | Freq: Every day | ORAL | Status: DC | PRN
  Administered 2014-12-23: 22:00:00 5 mg via ORAL
  Filled 2014-12-21: qty 1

## 2014-12-21 MED ORDER — GLIPIZIDE ER 5 MG PO TB24
5.0000 mg | ORAL_TABLET | Freq: Every day | ORAL | Status: DC
  Administered 2014-12-21 – 2014-12-24 (×4): 5 mg via ORAL
  Filled 2014-12-21 (×4): qty 1

## 2014-12-21 MED ORDER — SODIUM CHLORIDE 0.9 % IV BOLUS (SEPSIS)
1000.0000 mL | Freq: Once | INTRAVENOUS | Status: AC
  Administered 2014-12-21: 1000 mL via INTRAVENOUS

## 2014-12-21 MED ORDER — ASPIRIN 81 MG PO CHEW
324.0000 mg | CHEWABLE_TABLET | Freq: Once | ORAL | Status: AC
  Administered 2014-12-21: 324 mg via ORAL

## 2014-12-21 MED ORDER — VITAMIN B-12 100 MCG PO TABS
250.0000 ug | ORAL_TABLET | Freq: Every day | ORAL | Status: DC
  Administered 2014-12-21 – 2014-12-26 (×5): 250 ug via ORAL
  Filled 2014-12-21 (×5): qty 3

## 2014-12-21 MED ORDER — ONDANSETRON HCL 4 MG/2ML IJ SOLN: 4.0000 mg | Freq: Four times a day (QID) | INTRAMUSCULAR | Status: DC | PRN

## 2014-12-21 MED ORDER — LEVOTHYROXINE SODIUM 100 MCG PO TABS
100.0000 ug | ORAL_TABLET | Freq: Every day | ORAL | Status: DC
  Administered 2014-12-22 – 2014-12-26 (×5): 100 ug via ORAL
  Filled 2014-12-21 (×5): qty 1

## 2014-12-21 MED ORDER — SODIUM CHLORIDE 0.9 % IV SOLN
Freq: Once | INTRAVENOUS | Status: AC
Start: 2014-12-21 — End: 2014-12-22
  Administered 2014-12-21: 12:00:00 via INTRAVENOUS

## 2014-12-21 MED ORDER — LOSARTAN POTASSIUM 50 MG PO TABS
100.0000 mg | ORAL_TABLET | Freq: Every day | ORAL | Status: DC
  Administered 2014-12-21 – 2014-12-26 (×6): 100 mg via ORAL
  Filled 2014-12-21 (×6): qty 2

## 2014-12-21 MED ORDER — LATANOPROST 0.005 % OP SOLN
1.0000 [drp] | Freq: Every day | OPHTHALMIC | Status: DC
  Administered 2014-12-21 – 2014-12-25 (×5): 1 [drp] via OPHTHALMIC
  Filled 2014-12-21: qty 2.5

## 2014-12-21 MED ORDER — PANTOPRAZOLE SODIUM 40 MG PO TBEC
40.0000 mg | DELAYED_RELEASE_TABLET | Freq: Every day | ORAL | Status: DC
  Administered 2014-12-21 – 2014-12-26 (×6): 40 mg via ORAL
  Filled 2014-12-21 (×6): qty 1

## 2014-12-21 MED ORDER — ACETAMINOPHEN 650 MG RE SUPP: 650.0000 mg | Freq: Four times a day (QID) | RECTAL | Status: DC | PRN

## 2014-12-21 MED ORDER — SOTALOL HCL 80 MG PO TABS
80.0000 mg | ORAL_TABLET | Freq: Two times a day (BID) | ORAL | Status: DC
  Administered 2014-12-21 – 2014-12-26 (×11): 80 mg via ORAL
  Filled 2014-12-21 (×12): qty 1

## 2014-12-21 MED ORDER — AMLODIPINE BESYLATE 10 MG PO TABS
10.0000 mg | ORAL_TABLET | Freq: Every day | ORAL | Status: DC
  Administered 2014-12-21 – 2014-12-23 (×3): 10 mg via ORAL
  Filled 2014-12-21 (×3): qty 1

## 2014-12-21 MED ORDER — SIMVASTATIN 20 MG PO TABS
20.0000 mg | ORAL_TABLET | Freq: Every day | ORAL | Status: DC
  Administered 2014-12-21 – 2014-12-25 (×5): 20 mg via ORAL
  Filled 2014-12-21 (×6): qty 1

## 2014-12-21 MED ORDER — CARBIDOPA-LEVODOPA 25-100 MG PO TABS
1.0000 | ORAL_TABLET | Freq: Three times a day (TID) | ORAL | Status: DC
  Administered 2014-12-21 – 2014-12-24 (×11): 1 via ORAL
  Filled 2014-12-21 (×11): qty 1

## 2014-12-21 MED ORDER — OXYCODONE HCL 5 MG PO TABS
5.0000 mg | ORAL_TABLET | ORAL | Status: DC | PRN
  Administered 2014-12-26: 5 mg via ORAL
  Filled 2014-12-21: qty 1

## 2014-12-21 MED ORDER — ACETAMINOPHEN 325 MG PO TABS: 650.0000 mg | ORAL_TABLET | ORAL | Status: DC | PRN

## 2014-12-21 MED ORDER — FERROUS SULFATE 325 (65 FE) MG PO TABS
325.0000 mg | ORAL_TABLET | Freq: Every day | ORAL | Status: DC
  Administered 2014-12-22 – 2014-12-26 (×5): 325 mg via ORAL
  Filled 2014-12-21 (×5): qty 1

## 2014-12-21 MED ORDER — ACETAMINOPHEN 325 MG PO TABS
650.0000 mg | ORAL_TABLET | Freq: Four times a day (QID) | ORAL | Status: DC | PRN
  Administered 2014-12-22: 01:00:00 650 mg via ORAL
  Filled 2014-12-21 (×2): qty 2

## 2014-12-21 MED ORDER — BRIMONIDINE TARTRATE 0.15 % OP SOLN
1.0000 [drp] | Freq: Two times a day (BID) | OPHTHALMIC | Status: DC
  Administered 2014-12-21 – 2014-12-26 (×11): 1 [drp] via OPHTHALMIC
  Filled 2014-12-21: qty 5

## 2014-12-21 MED ORDER — ONDANSETRON HCL 4 MG PO TABS: 4.0000 mg | ORAL_TABLET | Freq: Four times a day (QID) | ORAL | Status: DC | PRN

## 2014-12-21 MED ORDER — DOCUSATE SODIUM 100 MG PO CAPS
100.0000 mg | ORAL_CAPSULE | Freq: Two times a day (BID) | ORAL | Status: DC
  Administered 2014-12-21 – 2014-12-24 (×8): 100 mg via ORAL
  Filled 2014-12-21 (×8): qty 1

## 2014-12-21 MED ORDER — ENOXAPARIN SODIUM 60 MG/0.6ML ~~LOC~~ SOLN
60.0000 mg | Freq: Every day | SUBCUTANEOUS | Status: DC
  Administered 2014-12-21 – 2014-12-22 (×2): 60 mg via SUBCUTANEOUS
  Filled 2014-12-21 (×2): qty 0.6

## 2014-12-21 MED ORDER — VITAMIN D 1000 UNITS PO TABS
1000.0000 [IU] | ORAL_TABLET | Freq: Every day | ORAL | Status: DC
  Administered 2014-12-21 – 2014-12-26 (×6): 1000 [IU] via ORAL
  Filled 2014-12-21 (×6): qty 1

## 2014-12-21 NOTE — ED Provider Notes (Signed)
[facility] Emergency Department Provider Note  ____________________________________________  Time seen: 7:00 AM  I have reviewed the triage vital signs and the nursing notes.   HISTORY  Chief Complaint Weakness    HPI [name] is a 79 y.o. female who complains of generalized weakness and difficulty walking for the past 2 days. It seems to be getting worse over the last 24 hours. She feels particularly bad when she stands up and tries to walk to the bathroom she denies dysuria frequency urgency or hematuria. Denies syncope although her husband states that there have been a few episodes where she would have fallen to the ground if you're not there to support her and catch her. Pain or shortness of breath, fevers chills cough runny nose or recent illness.  Her doctors recently changed some medications including restarting Lasix and increasing her Synthroid dosage one week ago. She has been on chemotherapy for the past 4 months, without any changes.   Past Medical History  Diagnosis Date  . Atrial fibrillation   . Parkinson's disease   . Glaucoma   . Hypertension   . Hypothyroidism   . GERD (gastroesophageal reflux disease)   . High cholesterol   . Diabetes mellitus without complication   . CAD (coronary artery disease) of bypass graft   . History of appendectomy   . H/O: hysterectomy   . Leukemia     Patient Active Problem List   Diagnosis Date Noted  . AML (acute myeloblastic leukemia)   . Goals of care, counseling/discussion   . Atrial fibrillation 10/31/2014  . CHF (congestive heart failure) 10/31/2014  . Mechanical heart valve present 10/31/2014  . Coronary artery disease 10/31/2014  . Essential hypertension 10/31/2014  . Antineoplastic chemotherapy induced pancytopenia 10/31/2014  . SIRS (systemic inflammatory response syndrome) 10/30/2014  . AML (acute myelogenous leukemia) 10/21/2014    Past Surgical History  Procedure  Laterality Date  . Cardiac valve replacement    . Pacemaker insertion      Current Outpatient Rx  Name  Route  Sig  Dispense  Refill  . acetaminophen (TYLENOL) 325 MG tablet   Oral   Take 650 mg by mouth every 4 (four) hours as needed for mild pain or headache.         . allopurinol (ZYLOPRIM) 300 MG tablet   Oral   Take 300 mg by mouth daily.         Marland Kitchen amLODipine (NORVASC) 5 MG tablet   Oral   Take 10 mg by mouth daily.         . brimonidine (ALPHAGAN) 0.15 % ophthalmic solution   Both Eyes   Place 1 drop into both eyes 2 (two) times daily.         . carbidopa-levodopa (SINEMET IR) 25-100 MG per tablet   Oral   Take 1 tablet by mouth 3 (three) times daily.         . cholecalciferol (VITAMIN D) 1000 UNITS tablet   Oral   Take 1,000 Units by mouth daily.         Marland Kitchen docusate sodium (COLACE) 100 MG capsule   Oral   Take 1 capsule (100 mg total) by mouth 2 (two) times daily.   10 capsule   0   . enoxaparin (LOVENOX) 60 MG/0.6ML injection   Subcutaneous   Inject 60 mg into the skin daily.         . ferrous sulfate 325 (65 FE) MG tablet  Oral   Take 325 mg by mouth daily with breakfast.         . glipiZIDE (GLUCOTROL XL) 5 MG 24 hr tablet   Oral   Take 5 mg by mouth daily.         Marland Kitchen latanoprost (XALATAN) 0.005 % ophthalmic solution   Both Eyes   Place 1 drop into both eyes at bedtime.         Marland Kitchen levofloxacin (LEVAQUIN) 500 MG tablet   Oral   Take 1 tablet (500 mg total) by mouth daily.   7 tablet   0   . levothyroxine (SYNTHROID, LEVOTHROID) 100 MCG tablet   Oral   Take 100 mcg by mouth daily before breakfast.         . losartan (COZAAR) 100 MG tablet   Oral   Take 100 mg by mouth daily.         Marland Kitchen omeprazole (PRILOSEC) 40 MG capsule   Oral   Take 40 mg by mouth 2 (two) times daily.         . prochlorperazine (COMPAZINE) 10 MG tablet   Oral   Take 1 tablet (10 mg total) by mouth every 6 (six) hours as needed (Nausea or  vomiting).   30 tablet   1   . simvastatin (ZOCOR) 20 MG tablet   Oral   Take 20 mg by mouth at bedtime.         . sotalol (BETAPACE) 80 MG tablet   Oral   Take 80 mg by mouth 2 (two) times daily.         . vitamin B-12 (CYANOCOBALAMIN) 250 MCG tablet   Oral   Take 250 mcg by mouth daily.         . furosemide (LASIX) 20 MG tablet   Oral   Take 20 mg by mouth daily.         . posaconazole (NOXAFIL) 100 MG TBEC delayed-release tablet   Oral   Take 300 mg by mouth.         . valACYclovir (VALTREX) 500 MG tablet                 Allergies Hydrochlorothiazide; Lisinopril; and Losartan  Family History  Problem Relation Age of Onset  . CAD Other   . Heart disease Father     Social History History  Substance Use Topics  . Smoking status: Former Smoker -- 1.00 packs/day for 10 years    Types: Cigarettes  . Smokeless tobacco: Not on file  . Alcohol Use: No    Review of Systems  Constitutional: No fever or chills. No weight changes Eyes:No blurry vision or double vision.  ENT: No sore throat. Cardiovascular: No chest pain. Respiratory: No dyspnea or cough. Gastrointestinal: Negative for abdominal pain, vomiting and diarrhea.  No BRBPR or melena. Genitourinary: Negative for dysuria, urinary retention, bloody urine, or difficulty urinating. Musculoskeletal: Negative for back pain. No joint swelling or pain. Skin: Negative for rash. Neurological: Negative for headaches, focal weakness or numbness. Psychiatric:No anxiety or depression.   Endocrine:No hot/cold intolerance, changes in energy, or sleep difficulty.  10-point ROS otherwise negative.  ____________________________________________   PHYSICAL EXAM:  VITAL SIGNS: ED Triage Vitals  Enc Vitals Group     BP 12/21/14 0610 107/51 mmHg     Pulse Rate 12/21/14 0610 106     Resp 12/21/14 0610 18     Temp 12/21/14 0610 97.9 F (36.6 C)     Temp  Source 12/21/14 0610 Oral     SpO2 12/21/14 0610  98 %     Weight 12/21/14 0610 113 lb 8 oz (51.483 kg)     Height 12/21/14 0610 5\' 2"  (1.575 m)     Head Cir --      Peak Flow --      Pain Score --      Pain Loc --      Pain Edu? --      Excl. in [place]? --      Constitutional: Alert and oriented. Well appearing and in no distress. Eyes: No scleral icterus. No conjunctival pallor. PERRL. EOMI ENT   Head: Normocephalic and atraumatic.   Nose: No congestion/rhinnorhea. No septal hematoma   Mouth/Throat: Dry mucous membranes, no pharyngeal erythema. No peritonsillar mass. No uvula shift.   Neck: No stridor. No SubQ emphysema. No meningismus. Hematological/Lymphatic/Immunilogical: No cervical lymphadenopathy. Cardiovascular: Irregularly irregular rhythm, rate controlled at 100-110. Normal and symmetric distal pulses are present in all extremities. No murmurs, rubs, or gallops. Respiratory: Normal respiratory effort without tachypnea nor retractions. Breath sounds are clear and equal bilaterally. No wheezes/rales/rhonchi. Gastrointestinal: Soft and nontender. No distention. There is no CVA tenderness.  No rebound, rigidity, or guarding. Genitourinary: deferred Musculoskeletal: Nontender with normal range of motion in all extremities. No joint effusions.  No lower extremity tenderness.  No edema. Neurologic:   Normal speech and language.  CN 2-10 normal. Motor grossly intact. No pronator drift.  Normal gait. No gross focal neurologic deficits are appreciated.  Skin:  Skin is warm, dry and intact. No rash noted.  No petechiae, purpura, or bullae. Poor skin turgor Psychiatric: Mood and affect are normal. Speech and behavior are normal. Patient exhibits appropriate insight and judgment.  ____________________________________________    LABS (pertinent positives/negatives) (all labs ordered are listed, but only abnormal results are displayed) Labs Reviewed  BASIC METABOLIC PANEL - Abnormal; Notable for the following:     Creatinine, Ser 1.17 (*)    GFR calc non Af Amer 44 (*)    GFR calc Af Amer 51 (*)    All other components within normal limits  CBC WITH DIFFERENTIAL/PLATELET - Abnormal; Notable for the following:    WBC 0.6 (*)    RBC 3.36 (*)    Hemoglobin 10.1 (*)    HCT 30.0 (*)    RDW 19.0 (*)    Neutro Abs 0.2 (*)    Lymphs Abs 0.3 (*)    Monocytes Absolute 0.0 (*)    All other components within normal limits  TROPONIN I - Abnormal; Notable for the following:    Troponin I 0.04 (*)    All other components within normal limits  URINALYSIS COMPLETEWITH MICROSCOPIC ([facility] ONLY) - Abnormal; Notable for the following:    Color, Urine YELLOW (*)    APPearance HAZY (*)    Ketones, ur TRACE (*)    Leukocytes, UA TRACE (*)    Bacteria, UA RARE (*)    Squamous Epithelial / LPF TOO NUMEROUS TO COUNT (*)    All other components within normal limits   ____________________________________________   EKG  Interpreted by me Atrial fibrillation with a rate of 120 with rapid ventricular response. Normal axis and intervals. Normal QRS complexes. Normal ST segments. There are T-wave inversions in 1 aVL and V4 through V6, which are new compared to 10/30/2014  ____________________________________________    RADIOLOGY  Chest x-ray unremarkable  ____________________________________________   PROCEDURES  ____________________________________________   INITIAL IMPRESSION / ASSESSMENT AND  PLAN / ED COURSE  Pertinent labs & imaging results that were available during my care of the patient were reviewed by me and considered in my medical decision making (see chart for details).  Patient presents with generalized weakness which seems to be orthostatic or exertional in nature. We'll give her IV fluids while checking a urinalysis and labs. She does have new EKG changes which are concerning for possible cardiac involvement and we will check a troponin.  ----------------------------------------- 9:19 AM  on 12/21/2014 -----------------------------------------  Results significant for a slightly elevated troponin of 0.04. Her urinalysis does not reveal any evidence of infection, chest x-ray is unremarkable. I discussed the case with the hospitalist who will evaluate the patient in the ED for admission. I'll add on a TSH given the recent increase in Synthroid dosage.  ____________________________________________   FINAL CLINICAL IMPRESSION(S) / ED DIAGNOSES  Final diagnoses:  Generalized weakness  Abnormal EKG  Elevated troponin I level      Carrie Mew, MD 12/21/14 [phone]

## 2014-12-21 NOTE — ED Notes (Signed)
Dr. Joni Fears notified of critical WBC of 0.6

## 2014-12-21 NOTE — Consult Note (Signed)
[facility] Consultation Note  Patient ID: [name], MRN: [id number], DOB/AGE: 06-25-1935 79 y.o. Admit date: 12/21/2014   Date of Consult: 12/21/2014 Primary Physician: Adin Hector, MD Primary Cardiologist: Towanda Malkin  Chief Complaint:  Chief Complaint  Patient presents with  . Weakness   Reason for Consult: aortic valve replacement with elevated troponin and atrial fibrillation  HPI: 79 y.o. 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 The patient has had an elevated troponin more consistent with demand ischemia rather than acute coronary syndrome and has no evidence of congestive heart failure at this time. The patient has appropriate medication management for current medical issues as well as cardiovascular disease and there appears not to be any significant signs or symptoms of endocarditis at this time  Past Medical History  Diagnosis Date  . Atrial fibrillation   . Parkinson's disease   . Glaucoma   . Hypertension   . Hypothyroidism   . GERD (gastroesophageal reflux disease)   . High cholesterol   . Diabetes mellitus without complication   . CAD (coronary artery disease) of bypass graft   . History of appendectomy   . H/O: hysterectomy   . Leukemia       Surgical History:  Past Surgical History  Procedure Laterality Date  . Cardiac valve replacement    . Pacemaker insertion       Home Meds: Prior to Admission medications   Medication Sig Start Date End Date Taking? Authorizing Provider  acetaminophen (TYLENOL) 325 MG  tablet Take 650 mg by mouth every 4 (four) hours as needed for mild pain or headache.   Yes Historical Provider, MD  allopurinol (ZYLOPRIM) 300 MG tablet Take 300 mg by mouth daily.   Yes Historical Provider, MD  amLODipine (NORVASC) 5 MG tablet Take 10 mg by mouth daily.   Yes Historical Provider, MD  brimonidine (ALPHAGAN) 0.15 % ophthalmic solution Place 1 drop into both eyes 2 (two) times daily.   Yes Historical Provider, MD  carbidopa-levodopa (SINEMET IR) 25-100 MG per tablet Take 1 tablet by mouth 3 (three) times daily.   Yes Historical Provider, MD  cholecalciferol (VITAMIN D) 1000 UNITS tablet Take 1,000 Units by mouth daily.   Yes Historical Provider, MD  docusate sodium (COLACE) 100 MG capsule Take 1 capsule (100 mg total) by mouth 2 (two) times daily. 11/02/14  Yes Tama High III, MD  enoxaparin (LOVENOX) 60 MG/0.6ML injection Inject 60 mg into the skin daily.   Yes Historical Provider, MD  ferrous sulfate 325 (65 FE) MG tablet Take 325 mg by mouth daily with breakfast.   Yes Historical Provider, MD  glipiZIDE (GLUCOTROL XL) 5 MG 24 hr tablet Take 5 mg by mouth daily.   Yes Historical Provider, MD  latanoprost (XALATAN) 0.005 % ophthalmic solution Place 1 drop into both eyes at bedtime.   Yes Historical Provider, MD  levofloxacin (LEVAQUIN) 500 MG tablet Take 1 tablet (500 mg total) by mouth daily. 11/28/14  Yes Evlyn Kanner, NP  levothyroxine (SYNTHROID, LEVOTHROID) 100 MCG tablet Take 100 mcg by mouth daily before breakfast.   Yes Historical Provider,  MD  losartan (COZAAR) 100 MG tablet Take 100 mg by mouth daily.   Yes Historical Provider, MD  omeprazole (PRILOSEC) 40 MG capsule Take 40 mg by mouth 2 (two) times daily.   Yes Historical Provider, MD  prochlorperazine (COMPAZINE) 10 MG tablet Take 1 tablet (10 mg total) by mouth every 6 (six) hours as needed (Nausea or vomiting). 10/22/14  Yes Lloyd Huger, MD  simvastatin (ZOCOR) 20 MG tablet Take 20 mg by mouth at bedtime.    Yes Historical Provider, MD  sotalol (BETAPACE) 80 MG tablet Take 80 mg by mouth 2 (two) times daily.   Yes Historical Provider, MD  vitamin B-12 (CYANOCOBALAMIN) 250 MCG tablet Take 250 mcg by mouth daily.   Yes Historical Provider, MD  furosemide (LASIX) 20 MG tablet Take 20 mg by mouth daily.    Historical Provider, MD  posaconazole (NOXAFIL) 100 MG TBEC delayed-release tablet Take 300 mg by mouth. 12/14/14   Historical Provider, MD  valACYclovir (VALTREX) 500 MG tablet  12/14/14   Historical Provider, MD    Inpatient Medications:  . allopurinol  300 mg Oral Daily  . amLODipine  10 mg Oral Daily  . brimonidine  1 drop Both Eyes BID  . carbidopa-levodopa  1 tablet Oral TID  . cholecalciferol  1,000 Units Oral Daily  . docusate sodium  100 mg Oral BID  . enoxaparin  60 mg Subcutaneous Daily  . [START ON 12/22/2014] ferrous sulfate  325 mg Oral Q breakfast  . glipiZIDE  5 mg Oral Daily  . latanoprost  1 drop Both Eyes QHS  . [START ON 12/22/2014] levothyroxine  100 mcg Oral QAC breakfast  . losartan  100 mg Oral Daily  . pantoprazole  40 mg Oral Daily  . simvastatin  20 mg Oral QHS  . sotalol  80 mg Oral BID  . vitamin B-12  250 mcg Oral Daily      Allergies:  Allergies  Allergen Reactions  . Hydrochlorothiazide Other (See Comments)    Worsening hypercalcemia Worsening hypercalcemia Reaction:  Unknown   . Lisinopril Cough and Other (See Comments)    Worsening renal insufficiency at 40mg  dose Other reaction(s): Other (See Comments) Worsening renal insufficiency at 40mg  dose  . Losartan Other (See Comments)    Worsened renal insufficiency Other reaction(s): Other (See Comments) Worsened renal insufficiency Reaction:  Unknown     History   Social History  . Marital Status: Married    Spouse Name: N/A  . Number of Children: N/A  . Years of Education: N/A   Occupational History  . Not on file.   Social History Main Topics  . Smoking status: Former Smoker -- 1.00  packs/day for 10 years    Types: Cigarettes  . Smokeless tobacco: Not on file  . Alcohol Use: No  . Drug Use: No  . Sexual Activity: Not on file   Other Topics Concern  . Not on file   Social History Narrative     Family History  Problem Relation Age of Onset  . CAD Other   . Heart disease Father      Review of Systems Positive for weakness and fatigue Negative for: General:   night sweats or weight changes.  Cardiovascular: PND orthopnea syncope dizziness  Dermatological skin lesions rashes Respiratory: Cough congestion Urologic: Frequent urination urination at night and hematuria Abdominal: negative for nausea, vomiting, diarrhea, bright red blood per rectum, melena, or hematemesis Neurologic: negative for visual changes, and/or hearing changes  All  other systems reviewed and are otherwise negative except as noted above.  Labs:  Recent Labs  12/21/14 0741  TROPONINI 0.04*   Lab Results  Component Value Date   WBC 0.6* 12/21/2014   HGB 10.1* 12/21/2014   HCT 30.0* 12/21/2014   MCV 89.4 12/21/2014   PLT 233 12/21/2014    Recent Labs Lab 12/21/14 0741  NA 137  K 3.6  CL 102  CO2 25  BUN 12  CREATININE 1.17*  CALCIUM 10.2  GLUCOSE 99   No results found for: CHOL, HDL, LDLCALC, TRIG No results found for: DDIMER  Radiology/Studies:  No results found.  EKG: Ventricular pacing  Weights: Filed Weights   12/21/14 0610  Weight: 113 lb 8 oz (51.483 kg)     Physical Exam: Blood pressure 141/57, pulse 64, temperature 98.1 F (36.7 C), temperature source Oral, resp. rate 20, height 5\' 2"  (1.575 m), weight 113 lb 8 oz (51.483 kg), SpO2 91 %. Body mass index is 20.75 kg/(m^2). General: Well developed, well nourished, in no acute distress. Head eyes ears nose throat: Normocephalic, atraumatic, sclera non-icteric, no xanthomas, nares are without discharge. No apparent thyromegaly and/or mass  Lungs: Normal respiratory effort.  no wheezes, few rales, no  rhonchi.  Heart: RRR with normal S1 mechanical S2. 2-3+ aortic murmur no gallop, no rub, PMI is inferior placement, carotid upstroke normal with bruit, jugular venous pressure is normal Abdomen: Soft, non-tender, non-distended with normoactive bowel sounds. No hepatomegaly. No rebound/guarding. No obvious abdominal masses. Abdominal aorta is normal size without bruit Extremities: Trace edema. no cyanosis, no clubbing, no ulcers  Peripheral : 2+ bilateral upper extremity pulses, 2+ bilateral femoral pulses, 2+ bilateral dorsal pedal pulse Neuro: Alert and oriented. No facial asymmetry. No focal deficit. Moves all extremities spontaneously. Musculoskeletal: Normal muscle tone with mild kyphosis Psych:  Responds to questions appropriately with a normal affect.    Assessment: 79 year old female with aortic valve stenosis status post aortic valve replacement coronary artery bypass grafted with chronic kidney disease stage III chronic atrial fibrillation status post pacemaker placement with essential hypertension and elevated troponin consistent with demand ischemia rather than acute coronary syndrome likely secondary to recent chemotherapy and infection  Plan: 1. Continue medication management for helpful and maintenance of normal sinus rhythm with sotalol 2. Anticoagulation using heparin and/or warfarin if able for further risk reduction in thrombosis of valve and or atrial fibrillation risk in stroke 3. No further cardiac diagnostics at this time due to no current evidence of acute congestive heart failure or myocardial infarction 4. Antibiotics as necessary for infection 5. Further adjustments of medication management after ambulation  Signed, Corey Skains M.D. [facility] 12/21/2014, 6:26 PM

## 2014-12-21 NOTE — H&P (Addendum)
[facility] [place] NAME: [name]    MR#:  [id number]  DATE OF BIRTH:  [birth year]  DATE OF ADMISSION:  12/21/2014  PRIMARY CARE PHYSICIAN: Tama High III, MD   REQUESTING/REFERRING PHYSICIAN: Dr Joni Fears  CHIEF COMPLAINT:  Generalized weakness not able to walk  HISTORY OF PRESENT ILLNESS:  [name]  is a 79 y.o. female with a known history of acute myeloid leukemia with ongoing chemotherapy at the cancer center, mechanical heart valve on chronic anticoagulation with Lovenox, hypertension, Parkinson's disease, comes to the emergency room accompanied by her husband with complaints of generalized weakness and difficulty ambulating at home and overall not feeling well. He denies any fever or chest pain shortness of breath abdominal pain. Patient normally ambulates using a cane she was not able to do that in the morning today. She recently completed her last chemotherapy in and of June. She has next dose of chemotherapy scheduled for next week. She received a blood transfusion on July 13. Hemoglobin today is 10. She is being admitted for further vascular management of her abnormal EKG, generalized weakness.  PAST MEDICAL HISTORY:   Past Medical History  Diagnosis Date  . Atrial fibrillation   . Parkinson's disease   . Glaucoma   . Hypertension   . Hypothyroidism   . GERD (gastroesophageal reflux disease)   . High cholesterol   . Diabetes mellitus without complication   . CAD (coronary artery disease) of bypass graft   . History of appendectomy   . H/O: hysterectomy   . Leukemia     PAST SURGICAL HISTOIRY:   Past Surgical History  Procedure Laterality Date  . Cardiac valve replacement    . Pacemaker insertion      SOCIAL HISTORY:   History  Substance Use Topics  . Smoking status: Former Smoker -- 1.00 packs/day for 10 years    Types: Cigarettes  . Smokeless tobacco: Not on file  . Alcohol Use: No     FAMILY HISTORY:   Family History  Problem Relation Age of Onset  . CAD Other   . Heart disease Father     DRUG ALLERGIES:   Allergies  Allergen Reactions  . Hydrochlorothiazide Other (See Comments)    Worsening hypercalcemia Worsening hypercalcemia Reaction:  Unknown   . Lisinopril Cough and Other (See Comments)    Worsening renal insufficiency at 40mg  dose Other reaction(s): Other (See Comments) Worsening renal insufficiency at 40mg  dose  . Losartan Other (See Comments)    Worsened renal insufficiency Other reaction(s): Other (See Comments) Worsened renal insufficiency Reaction:  Unknown     REVIEW OF SYSTEMS:  Review of Systems  Constitutional: Positive for malaise/fatigue. Negative for fever, chills and diaphoresis.  HENT: Negative for congestion, ear pain, hearing loss, nosebleeds and sore throat.   Eyes: Negative for blurred vision, double vision, photophobia and pain.  Respiratory: Negative for hemoptysis, sputum production, wheezing and stridor.   Cardiovascular: Negative for chest pain, orthopnea, claudication and leg swelling.  Gastrointestinal: Negative for heartburn, nausea and abdominal pain.  Genitourinary: Negative for dysuria and frequency.  Musculoskeletal: Negative for back pain, joint pain and neck pain.  Skin: Negative for rash.  Neurological: Positive for weakness. Negative for tingling, sensory change, speech change, focal weakness, seizures and headaches.  Endo/Heme/Allergies: Does not bruise/bleed easily.  Psychiatric/Behavioral: Negative for memory loss. The patient is not nervous/anxious.   All other systems reviewed and are negative.    MEDICATIONS AT HOME:  Prior to Admission medications   Medication Sig Start Date End Date Taking? Authorizing Provider  acetaminophen (TYLENOL) 325 MG tablet Take 650 mg by mouth every 4 (four) hours as needed for mild pain or headache.   Yes Historical Provider, MD  allopurinol (ZYLOPRIM) 300 MG tablet  Take 300 mg by mouth daily.   Yes Historical Provider, MD  amLODipine (NORVASC) 5 MG tablet Take 10 mg by mouth daily.   Yes Historical Provider, MD  brimonidine (ALPHAGAN) 0.15 % ophthalmic solution Place 1 drop into both eyes 2 (two) times daily.   Yes Historical Provider, MD  carbidopa-levodopa (SINEMET IR) 25-100 MG per tablet Take 1 tablet by mouth 3 (three) times daily.   Yes Historical Provider, MD  cholecalciferol (VITAMIN D) 1000 UNITS tablet Take 1,000 Units by mouth daily.   Yes Historical Provider, MD  docusate sodium (COLACE) 100 MG capsule Take 1 capsule (100 mg total) by mouth 2 (two) times daily. 11/02/14  Yes Tama High III, MD  enoxaparin (LOVENOX) 60 MG/0.6ML injection Inject 60 mg into the skin daily.   Yes Historical Provider, MD  ferrous sulfate 325 (65 FE) MG tablet Take 325 mg by mouth daily with breakfast.   Yes Historical Provider, MD  glipiZIDE (GLUCOTROL XL) 5 MG 24 hr tablet Take 5 mg by mouth daily.   Yes Historical Provider, MD  latanoprost (XALATAN) 0.005 % ophthalmic solution Place 1 drop into both eyes at bedtime.   Yes Historical Provider, MD  levofloxacin (LEVAQUIN) 500 MG tablet Take 1 tablet (500 mg total) by mouth daily. 11/28/14  Yes Evlyn Kanner, NP  levothyroxine (SYNTHROID, LEVOTHROID) 100 MCG tablet Take 100 mcg by mouth daily before breakfast.   Yes Historical Provider, MD  losartan (COZAAR) 100 MG tablet Take 100 mg by mouth daily.   Yes Historical Provider, MD  omeprazole (PRILOSEC) 40 MG capsule Take 40 mg by mouth 2 (two) times daily.   Yes Historical Provider, MD  prochlorperazine (COMPAZINE) 10 MG tablet Take 1 tablet (10 mg total) by mouth every 6 (six) hours as needed (Nausea or vomiting). 10/22/14  Yes Lloyd Huger, MD  simvastatin (ZOCOR) 20 MG tablet Take 20 mg by mouth at bedtime.   Yes Historical Provider, MD  sotalol (BETAPACE) 80 MG tablet Take 80 mg by mouth 2 (two) times daily.   Yes Historical Provider, MD  vitamin B-12  (CYANOCOBALAMIN) 250 MCG tablet Take 250 mcg by mouth daily.   Yes Historical Provider, MD  furosemide (LASIX) 20 MG tablet Take 20 mg by mouth daily.    Historical Provider, MD  posaconazole (NOXAFIL) 100 MG TBEC delayed-release tablet Take 300 mg by mouth. 12/14/14   Historical Provider, MD  valACYclovir (VALTREX) 500 MG tablet  12/14/14   Historical Provider, MD      VITAL SIGNS:  Blood pressure 140/76, pulse 99, temperature 97.9 F (36.6 C), temperature source Oral, resp. rate 22, height 5\' 2"  (1.575 m), weight 51.483 kg (113 lb 8 oz), SpO2 99 %.  PHYSICAL EXAMINATION:  GENERAL:  79 y.o.-year-old patient lying in the bed with no acute distress. Appears weak and chronically ill EYES: Pupils equal, round, reactive to light and accommodation. No scleral icterus. Extraocular muscles intact.  HEENT: Head atraumatic, normocephalic. Oropharynx and nasopharynx clear.  NECK:  Supple, no jugular venous distention. No thyroid enlargement, no tenderness.  LUNGS: Normal breath sounds bilaterally, no wheezing, rales,rhonchi or crepitation. No use of accessory muscles of respiration.  CARDIOVASCULAR: S1, S2 normal. No murmurs,  rubs, or gallops.  ABDOMEN: Soft, nontender, nondistended. Bowel sounds present. No organomegaly or mass.  EXTREMITIES: No pedal edema, cyanosis, or clubbing.  NEUROLOGIC: Cranial nerves II through XII are intact. Muscle strength 4/5 in all extremities. Sensation intact. Gait not checked.  PSYCHIATRIC: The patient is alert and oriented x 3.  SKIN: No obvious rash, lesion, or ulcer. She has lot of healed scars over her chest  LABORATORY PANEL:   CBC  Recent Labs Lab 12/21/14 0741  WBC 0.6*  HGB 10.1*  HCT 30.0*  PLT 233   ------------------------------------------------------------------------------------------------------------------  Chemistries   Recent Labs Lab 12/21/14 0741  NA 137  K 3.6  CL 102  CO2 25  GLUCOSE 99  BUN 12  CREATININE 1.17*  CALCIUM 10.2    ------------------------------------------------------------------------------------------------------------------  Cardiac Enzymes  Recent Labs Lab 12/21/14 0741  TROPONINI 0.04*    EKG:   SR with t wave inversion in V2-4 and lateral leads IMPRESSION AND PLAN:   79 year old Ms. Kiehn with multiple medical problems including ongoing active treatment with chemotherapy for her acute myeloleukemia comes to the emergency room with increasing weakness. She is being admitted with  #1 generalized weakness and difficulty ambulation.  Admit to oncology floor with off unit telemetry. Physical therapy to see patient. Management for discharge planning. No source of infection identified. Could be due to recent chemotherapy chronic anemia and chronic multiple comorbidities.  #2 abnormal EKG with flipped T waves in V2 V4 and lateral leads without any ongoing active chest pain We'll continue cardiac meds Cardiac consultation Cycle cardiac enzymes 2  #3 neutropenia appears chemotherapy-induced Neutropenic precautions Hold off any antibiotics since patient is afebrile no fever and no source of infection identified -d/w oncology need for neupogen  #4 history of mechanical heart valve Continue subcutaneous Lovenox for anticoagulation  #5 hypertension Resume home meds  #6 diabetes sliding scale insulin for now resume home meds of sugars are stable  Above was discussed with patient and patient's husband. Further workup of her to patient's clinical course  Case was discussed with Dr. Gilford Rile  All the records are reviewed and case discussed with ED provider. Management plans discussed with the patient, family and they are in agreement.  CODE STATUS: DO NOT RESUSCITATE this was discussed with patient and husband TOTAL TIME TAKING CARE OF THIS PATIENT: 9minutes.    Paolo Okane M.D on 12/21/2014 at 10:04 AM  Between 7am to 6pm - Pager - [phone]  After 6pm go to [email or web address] -  password [email or web address] [facility]  Office  [phone]  CC: Primary care physician; Adin Hector, MD   [phone]

## 2014-12-21 NOTE — Care Management (Addendum)
RNCM consult received.[facility] start care on this patient in April but did not which was a referral from [facility]. She was also referred by [facility] in January. They opened Jan 14-Feb 10 because patient refused further services per Tiffany with [facility]. Patient has walker, cane, shower chair in the home. ?Lives with husband. PCP is [name]. Referral again made to [facility]. Would patient benefit from LTAC?  RNCM to continue to follow.

## 2014-12-21 NOTE — ED Notes (Signed)
Pt brought to ER from home by [facility]. Per EMS pt reported feeling week and her legs were shaky when she got up to go to the bathroom. Pt was able to make it to the bathroom, but then was unable to get up from the toilet due to her legs feeling weak and shaky. VS for EMS 112/59 HR and Rhythm Afib 110-130, 96% on RA, BG 134

## 2014-12-21 NOTE — Plan of Care (Signed)
Problem: Discharge Progression Outcomes Goal: Discharge plan in place and appropriate Goes by [name], lives at home with husband, hx of:  Atrial fibrillation                        Parkinson's disease     Glaucoma         Hypertension     Hypothyroidism             GERD (gastroesophageal reflux disease)       High cholesterol            Diabetes mellitus without complication            CAD (coronary artery disease) of bypass graft            Leukemia         Pt is on home medications, her last chemo was the week of June 13th.       Goal: Other Discharge Outcomes/Goals Outcome: Progressing Pt alert and oriented, no complaints of pain or discomfort at this time will continue to monitor labs for WBC. Up to bsc x1 assist.

## 2014-12-21 NOTE — Progress Notes (Signed)
PT Cancellation Note  Patient Details Name: FRANCI OSHANA MRN: [id number] DOB: [birth year]   Cancelled Treatment:    Reason Eval/Treat Not Completed: Patient not medically ready. Patient presents with abnormal EKG and cardiology consult pending. PT will await cardio input before performing mobility evaluation. PT will continue to attempt as patient becomes more available and appropriate for mobility.   Kerman Passey, PT, DPT    12/21/2014, 11:09 AM

## 2014-12-22 DIAGNOSIS — R5383 Other fatigue: Secondary | ICD-10-CM

## 2014-12-22 DIAGNOSIS — G20 Parkinson's disease: Secondary | ICD-10-CM

## 2014-12-22 DIAGNOSIS — I10 Essential (primary) hypertension: Secondary | ICD-10-CM

## 2014-12-22 DIAGNOSIS — R531 Weakness: Secondary | ICD-10-CM

## 2014-12-22 DIAGNOSIS — I2510 Atherosclerotic heart disease of native coronary artery without angina pectoris: Secondary | ICD-10-CM

## 2014-12-22 DIAGNOSIS — R9431 Abnormal electrocardiogram [ECG] [EKG]: Secondary | ICD-10-CM

## 2014-12-22 DIAGNOSIS — I4891 Unspecified atrial fibrillation: Secondary | ICD-10-CM

## 2014-12-22 DIAGNOSIS — C9200 Acute myeloblastic leukemia, not having achieved remission: Secondary | ICD-10-CM

## 2014-12-22 DIAGNOSIS — Z9221 Personal history of antineoplastic chemotherapy: Secondary | ICD-10-CM

## 2014-12-22 DIAGNOSIS — E119 Type 2 diabetes mellitus without complications: Secondary | ICD-10-CM

## 2014-12-22 DIAGNOSIS — Z951 Presence of aortocoronary bypass graft: Secondary | ICD-10-CM

## 2014-12-22 DIAGNOSIS — E039 Hypothyroidism, unspecified: Secondary | ICD-10-CM

## 2014-12-22 LAB — CBC
HCT: 24 % — ABNORMAL LOW (ref 35.0–47.0)
HEMOGLOBIN: 8 g/dL — AB (ref 12.0–16.0)
MCH: 29.7 pg (ref 26.0–34.0)
MCHC: 33.5 g/dL (ref 32.0–36.0)
MCV: 88.8 fL (ref 80.0–100.0)
Platelets: 178 10*3/uL (ref 150–440)
RBC: 2.71 MIL/uL — ABNORMAL LOW (ref 3.80–5.20)
RDW: 19.1 % — ABNORMAL HIGH (ref 11.5–14.5)
WBC: 0.6 10*3/uL — CL (ref 3.6–11.0)

## 2014-12-22 LAB — PREPARE RBC (CROSSMATCH)

## 2014-12-22 MED ORDER — ACETAMINOPHEN 325 MG PO TABS
650.0000 mg | ORAL_TABLET | Freq: Once | ORAL | Status: AC
  Administered 2014-12-22: 17:00:00 650 mg via ORAL

## 2014-12-22 MED ORDER — SODIUM CHLORIDE 0.9 % IV SOLN
250.0000 mL | Freq: Once | INTRAVENOUS | Status: AC
  Administered 2014-12-22: 17:00:00 250 mL via INTRAVENOUS

## 2014-12-22 MED ORDER — SODIUM CHLORIDE 0.9 % IJ SOLN: 3.0000 mL | INTRAMUSCULAR | Status: DC | PRN

## 2014-12-22 MED ORDER — SODIUM CHLORIDE 0.9 % IJ SOLN
10.0000 mL | INTRAMUSCULAR | Status: DC | PRN
Start: 2014-12-22 — End: 2014-12-26

## 2014-12-22 MED ORDER — DIPHENHYDRAMINE HCL 25 MG PO CAPS
25.0000 mg | ORAL_CAPSULE | Freq: Once | ORAL | Status: AC
Start: 2014-12-22 — End: 2014-12-22
  Administered 2014-12-22: 17:00:00 25 mg via ORAL
  Filled 2014-12-22: qty 1

## 2014-12-22 MED ORDER — HEPARIN SOD (PORK) LOCK FLUSH 100 UNIT/ML IV SOLN: 500.0000 [IU] | Freq: Every day | INTRAVENOUS | Status: DC | PRN

## 2014-12-22 MED ORDER — HEPARIN SOD (PORK) LOCK FLUSH 100 UNIT/ML IV SOLN: 250.0000 [IU] | INTRAVENOUS | Status: DC | PRN

## 2014-12-22 MED ORDER — ENOXAPARIN SODIUM 60 MG/0.6ML ~~LOC~~ SOLN
1.0000 mg/kg | Freq: Two times a day (BID) | SUBCUTANEOUS | Status: DC
  Administered 2014-12-22 – 2014-12-26 (×8): 50 mg via SUBCUTANEOUS
  Filled 2014-12-22 (×8): qty 0.6

## 2014-12-22 NOTE — Progress Notes (Signed)
Initial Nutrition Assessment  DOCUMENTATION CODES:   Non-severe (moderate) malnutrition in context of chronic illness  INTERVENTION:   Meals and snacks: Cater to pt preferences Nutrition Supplement therapy: Will add no sugar added mightyshake BID (300 kcals and 11 gm of protein)   NUTRITION DIAGNOSIS:   Inadequate oral intake related to acute illness as evidenced by energy intake < 75% for > or equal to 1 month.    GOAL:   Patient will meet greater than or equal to 90% of their needs    MONITOR:    (Energy intake, Anthropometrics, Electrolyte and renal profile)  REASON FOR ASSESSMENT:   Malnutrition Screening Tool    ASSESSMENT:   Pt admitted with extreme weakness, anemia, recent chemo  Past Medical History  Diagnosis Date  . Atrial fibrillation   . Parkinson's disease   . Glaucoma   . Hypertension   . Hypothyroidism   . GERD (gastroesophageal reflux disease)   . High cholesterol   . Diabetes mellitus without complication   . CAD (coronary artery disease) of bypass graft   . History of appendectomy   . H/O: hysterectomy   . Leukemia     Current Nutrition: ate most of cereal and drank 100% of coffee this am  Food/Nutrition-Related History: pt reports poor po intake for the past month. Reports intake is up and down   Medications: NS at 23ml/hr, dulcolax, colace, vit b12, glipizide  Electrolyte/Renal Profile and Glucose Profile:   Recent Labs Lab 12/17/14 1335 12/21/14 0741  NA 133* 137  K 3.6 3.6  CL 105 102  CO2 24 25  BUN 11 12  CREATININE 1.06* 1.17*  CALCIUM 9.1 10.2  GLUCOSE 159* 99    Nutritional Anemia Profile:  CBC Latest Ref Rng 12/22/2014 12/21/2014 12/17/2014  WBC 3.6 - 11.0 K/uL 0.6(LL) 0.6(LL) 0.4(LL)  Hemoglobin 12.0 - 16.0 g/dL 8.0(L) 10.1(L) 7.3(L)  Hematocrit 35.0 - 47.0 % 24.0(L) 30.0(L) 22.5(L)  Platelets 150 - 440 K/uL 178 233 271     Last BM:7/14   Nutrition-Focused Physical Exam Findings: Nutrition-Focused  physical exam completed. Findings are normal fat depletion, mild/moderate muscle depletion, and mild edema.      Weight Change: pt thinks has lost weight but unsure how much. Reviewed last weight encounters and noted 2% weight loss in the last 2 months    Diet Order:  Diet Carb Modified Fluid consistency:: Thin; Room service appropriate?: Yes  Skin:  Reviewed, no issues   Height:   Ht Readings from Last 1 Encounters:  12/21/14 5\' 2"  (1.575 m)    Weight:   Wt Readings from Last 1 Encounters:  12/21/14 113 lb 8 oz (51.483 kg)    Ideal Body Weight:     Wt Readings from Last 10 Encounters:  12/21/14 113 lb 8 oz (51.483 kg)  12/05/14 117 lb 1 oz (53.1 kg)  11/19/14 111 lb 15.9 oz (50.8 kg)  11/06/14 115 lb 1.3 oz (52.2 kg)  10/30/14 117 lb (53.071 kg)  10/16/14 115 lb 1.3 oz (52.2 kg)    BMI:  Body mass index is 20.75 kg/(m^2).  Estimated Nutritional Needs:   Kcal:  BEE 948 kcals (IF 1.0-1.2, AF 1.3) 5462-7035 kcals/d.  Protein:  (1.2-1.5 gm/d) 61-77 g/d  Fluid:  (25-38ml/kg)1275-1530ml/d  EDUCATION NEEDS:   No education needs identified at this time  Carrier. Zenia Resides, [place], [place] (pager)

## 2014-12-22 NOTE — Consult Note (Signed)
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 Transfusion  HISTORY OF PRESENT ILLNESS:  79 year old female patient with history of multiple medical problems as detailed below including known history of AML on treatment with chemotherapy (Vidaza) by Dr. Grayland Ormond. Patient recently completed another course of chemotherapy about 2 weeks ago. States that she recently got a blood transfusion and it made her feel better for a few days. Patient was admitted to hospital yesterday after she came to the emergency room accompanied by her husband with complaints of generalized weakness and difficulty ambulating at home and overall not feeling well, and was unable to ambulate using cane which she had been doing up until recently. She was admitted to hospital because of abnormal EKG and progressive weakness. Otherwise denies any shortness of breath at rest, new cough, hemoptysis or chest pain. No orthopnea or PND. Denies any obvious bleeding issues. Hemoglobin is down to 8.0 today. No nausea vomiting or diarrhea.  REVIEW OF SYSTEMS:  CONSTITUTIONAL: As in HPI above. No chills, fever or sweats.    ENT:  No headache, dizziness at rest or epistaxis. No ear or jaw pain. RESPIRATORY:  Denies cough, sputum or hemoptysis. Has dyspnea on exertion and sometimes at rest. CARDIAC:  No palpitations.  No retrosternal chest pain. No orthopnea. GI:  No abdominal pain, nausea or vomiting. No diarrhea.   GU:  No dysuria or hematuria.  SKIN: No rashes or pruritus. HEMATOLOGIC: denies obvious bleeding symptoms MUSCULOSKELETAL:  Has chronic joint pains.  NEURO:  No new focal weakness, numbness or tingling of extremities.    ENDOCRINE:  No polyuria or polydipsia.   PAST MEDICAL HISTORY: Past Medical History  Diagnosis Date  . Atrial fibrillation   . Parkinson's disease   . Glaucoma   . Hypertension   .  Hypothyroidism   . GERD (gastroesophageal reflux disease)   . High cholesterol   . Diabetes mellitus without complication   . CAD (coronary artery disease) of bypass graft   . History of appendectomy   . H/O: hysterectomy   . Leukemia     PAST SURGICAL HISTORY: Past Surgical History  Procedure Laterality Date  . Cardiac valve replacement    . Pacemaker insertion      FAMILY HISTORY: Family History  Problem Relation Age of Onset  . CAD Other   . Heart disease Father    SOCIAL HISTORY: History  Substance Use Topics  . Smoking status: Former Smoker -- 1.00 packs/day for 10 years    Types: Cigarettes  . Smokeless tobacco: Not on file  . Alcohol Use: No    Allergies  Allergen Reactions  . Hydrochlorothiazide Other (See Comments)    Worsening hypercalcemia Worsening hypercalcemia Reaction: Unknown   . Lisinopril Cough and Other (See Comments)    Worsening renal insufficiency at 40mg  dose Other reaction(s): Other (See Comments) Worsening renal insufficiency at 40mg  dose  . Losartan Other (See Comments)    Worsened renal insufficiency Other reaction(s): Other (See Comments) Worsened renal insufficiency Reaction: Unknown     Current Outpatient Prescriptions  Medication Sig Dispense Refill  . acetaminophen (TYLENOL) 325 MG tablet Take 650 mg by mouth every 4 (four) hours as needed for mild pain or headache.    . allopurinol (ZYLOPRIM) 300 MG tablet Take 300 mg by mouth daily.    Marland Kitchen amLODipine (NORVASC) 5 MG tablet Take 10 mg by mouth daily.    . brimonidine (ALPHAGAN) 0.15 %  ophthalmic solution Place 1 drop into both eyes 2 (two) times daily.    . carbidopa-levodopa (SINEMET IR) 25-100 MG per tablet Take 1 tablet by mouth 3 (three) times daily.    . cholecalciferol (VITAMIN D) 1000 UNITS tablet Take 1,000 Units by mouth daily.    Marland Kitchen docusate  sodium (COLACE) 100 MG capsule Take 1 capsule (100 mg total) by mouth 2 (two) times daily. 10 capsule 0  . enoxaparin (LOVENOX) 60 MG/0.6ML injection Inject 60 mg into the skin daily.    . ferrous sulfate 325 (65 FE) MG tablet Take 325 mg by mouth daily with breakfast.    . glipiZIDE (GLUCOTROL XL) 5 MG 24 hr tablet Take 5 mg by mouth daily.    Marland Kitchen latanoprost (XALATAN) 0.005 % ophthalmic solution Place 1 drop into both eyes at bedtime.    Marland Kitchen levofloxacin (LEVAQUIN) 500 MG tablet Take 1 tablet (500 mg total) by mouth daily. 7 tablet 0  . levothyroxine (SYNTHROID, LEVOTHROID) 100 MCG tablet Take 100 mcg by mouth daily before breakfast.    . losartan (COZAAR) 100 MG tablet Take 100 mg by mouth daily.    Marland Kitchen omeprazole (PRILOSEC) 40 MG capsule Take 40 mg by mouth 2 (two) times daily.    . prochlorperazine (COMPAZINE) 10 MG tablet Take 1 tablet (10 mg total) by mouth every 6 (six) hours as needed (Nausea or vomiting). 30 tablet 1  . simvastatin (ZOCOR) 20 MG tablet Take 20 mg by mouth at bedtime.    . sotalol (BETAPACE) 80 MG tablet Take 80 mg by mouth 2 (two) times daily.    . vitamin B-12 (CYANOCOBALAMIN) 250 MCG tablet Take 250 mcg by mouth daily.      PHYSICAL EXAM: Filed Vitals:     BP: 129/60   Pulse: 84  Temp: 97  Resp: 18-22  CONSTITUTIONAL:   Patient is very weak and frail looking, sitting in chair, otherwise awake and oriented 3 and converses appropriately. She is in no acute distress. No icterus. Pallor 2+    HEENT:  Extraocular movements intact.  No oral thrush.  No cervical adenopathy.  RESPIRATORY: Bilateral good air entry, no crepitations or rhonchi. CARDIAC: S1S2, regular   ABDOMEN: soft, nontender.   EXTREMITIES: No pedal edema   SKIN:   No major bruising.  No rash.   NEUROLOGICAL:   awake and oriented x 3. Cranial nerves are intact.   Labs: WBC 0.6, hemoglobin 8.0, platelets 178.  On 12/21/14 -  creatinine 1.17, calcium 10.2, WBC 0.6, hemoglobin 10.1, platelets 233, ANC 0.2.  IMPRESSION / RECOMMENDATIONS: 79 year old female patient with known history of acute myeloid leukemia causing pancytopenia who has been on Vidaza chemotherapy which is also likely contributing to worsening pancytopenia, admitted with progressive weakness, abnormal EKG. Currently denies any palpitation or chest pain. She has dyspnea on minimal activity and is feeling very weak. Hemoglobin has drifted down to 8 g today, patient states that she recently got packed red blood cell transfusion and this did help improve the generalized weakness for a few days and wants to pursue transfusion support. Have explained the possible benefits and risks of blood product transfusion, she is agreeable to this and signed consent form. She has persistent significant leukopenia/neutropenia but does not have any ongoing fevers/chills/symptoms of active infection. Agree with ongoing neutropenic precautions, would avoid G-CSF given that she has AML. If she starts having fevers please  send cultures and consider broad-spectrum antibiotics (including double coverage for pseudomonas). Continue to monitor blood counts  and consider packed red blood cell transfusion as indicated by lab pains. Patient is aware that overall prognosis is poor. If condition declines, recommend palliative care consult to discuss goals of treatment/considering hospice. Will request primary oncologist Dr. Grayland Ormond to follow-up Monday onwards. Patient explained above details and she is agreeable to this plan. Thank you for the referral, please feel free to contact me if any additional questions.

## 2014-12-22 NOTE — Progress Notes (Signed)
Patient ID: [name], female   DOB: [birth year], 79 y.o.   MRN: [id number] [name] is a 79 y.o. female   SUBJECTIVE:  Admitted with extreme weakness following chemotherapy for AML. Hgb 10 on admission. Down to 8.1. No clinical signs of bleeding. WBC stable at 0.6. Remains in atrial fib. Cariology consult reviewed.  ______________________________________________________________________  ROS: Review of systems is unremarkable for any active cardiac,respiratory, GI, GU, hematologic, neurologic or psychiatric systems, 10 systems reviewed.  Marland Kitchen allopurinol  300 mg Oral Daily  . amLODipine  10 mg Oral Daily  . brimonidine  1 drop Both Eyes BID  . carbidopa-levodopa  1 tablet Oral TID  . cholecalciferol  1,000 Units Oral Daily  . docusate sodium  100 mg Oral BID  . enoxaparin  60 mg Subcutaneous Daily  . ferrous sulfate  325 mg Oral Q breakfast  . glipiZIDE  5 mg Oral Daily  . latanoprost  1 drop Both Eyes QHS  . levothyroxine  100 mcg Oral QAC breakfast  . losartan  100 mg Oral Daily  . pantoprazole  40 mg Oral Daily  . simvastatin  20 mg Oral QHS  . sotalol  80 mg Oral BID  . vitamin B-12  250 mcg Oral Daily   acetaminophen **OR** acetaminophen, bisacodyl, ondansetron **OR** ondansetron (ZOFRAN) IV, oxyCODONE   Past Medical History  Diagnosis Date  . Atrial fibrillation   . Parkinson's disease   . Glaucoma   . Hypertension   . Hypothyroidism   . GERD (gastroesophageal reflux disease)   . High cholesterol   . Diabetes mellitus without complication   . CAD (coronary artery disease) of bypass graft   . History of appendectomy   . H/O: hysterectomy   . Leukemia     Past Surgical History  Procedure Laterality Date  . Cardiac valve replacement    . Pacemaker insertion      PHYSICAL EXAM:  BP 127/52 mmHg  Pulse 79  Temp(Src) 98.7 F (37.1 C) (Oral)  Resp 20  Ht 5\' 2"  (1.575 m)  Wt 51.483 kg (113 lb 8 oz)  BMI 20.75 kg/m2  SpO2 100%  Wt Readings from  Last 3 Encounters:  12/21/14 51.483 kg (113 lb 8 oz)  12/05/14 53.1 kg (117 lb 1 oz)  11/19/14 50.8 kg (111 lb 15.9 oz)           BP Readings from Last 3 Encounters:  12/22/14 127/52  12/19/14 157/90  12/05/14 117/71    Constitutional: NAD. Marked dorsal kyphosis. Neck: supple, no thyromegaly Respiratory: CTA, no rales or wheezes Cardiovascular: irregular rhythm with 1-2/6 sem Abdomen: soft, good BS, nontender Extremities: no edema Neuro: alert and oriented, no focal motor or sensory deficits  ASSESSMENT/PLAN:  Labs and imaging studies were reviewed  1. Weakness due to anemia secondary to chemotherapy--monitor CBC for further drop in Hgb. Consult oncology. Order PT evaluation. 2. CAD with chronic atrial fib--cardoilogy consult reviewed. Rate under good control.

## 2014-12-22 NOTE — Plan of Care (Signed)
Problem: Discharge Progression Outcomes Goal: Other Discharge Outcomes/Goals Plan of care progress to goal for: weakness - Complained of headache, tylenol given with improvement. - VSS. - 1 assist, will continue to monitor.

## 2014-12-22 NOTE — Progress Notes (Signed)
[facility] Encounter Note  Patient: [name] / Admit Date: 12/21/2014 / Date of Encounter: 12/22/2014, 9:32 AM   Subjective: No significant changes from yesterday with some weakness and fatigue but no chest pain or shortness of breath  Review of Systems: Positive for: Weakness and fatigue Negative for: Vision change, hearing change, syncope, dizziness, nausea, vomiting,diarrhea, bloody stool, stomach pain, cough, congestion, diaphoresis, urinary frequency, urinary pain,skin lesions, skin rashes Others previously listed  Objective: Telemetry: Paced rhythm Physical Exam: Blood pressure 127/52, pulse 79, temperature 98.7 F (37.1 C), temperature source Oral, resp. rate 20, height 5\' 2"  (1.575 m), weight 113 lb 8 oz (51.483 kg), SpO2 100 %. Body mass index is 20.75 kg/(m^2). General: Well developed, well nourished, in no acute distress. Head: Normocephalic, atraumatic, sclera non-icteric, no xanthomas, nares are without discharge. Neck: No apparent masses Lungs: Normal respirations with no wheezes, no rhonchi, no rales , few basilar crackles   Heart: Regular rate and rhythm, normal S1 mechanical S2, no 2-3+ aortic murmur, no rub, no gallop, PMI is normal size and placement, carotid upstroke normal with bruit, jugular venous pressure normal Abdomen: Soft, non-tender, non-distended with normoactive bowel sounds. No hepatosplenomegaly. Abdominal aorta is normal size without bruit Extremities: Trace edema, no clubbing, no cyanosis, no ulcers,  Peripheral: 2+ radial, 2+ femoral, 2+ dorsal pedal pulses Neuro: Alert and oriented. Moves all extremities spontaneously. Psych:  Responds to questions appropriately with a normal affect.   Intake/Output Summary (Last 24 hours) at 12/22/14 0932 Last data filed at 12/22/14 0530  Gross per 24 hour  Intake      0 ml  Output    375 ml  Net   -375 ml    Inpatient Medications:  . allopurinol  300 mg Oral Daily  .  amLODipine  10 mg Oral Daily  . brimonidine  1 drop Both Eyes BID  . carbidopa-levodopa  1 tablet Oral TID  . cholecalciferol  1,000 Units Oral Daily  . docusate sodium  100 mg Oral BID  . enoxaparin  60 mg Subcutaneous Daily  . ferrous sulfate  325 mg Oral Q breakfast  . glipiZIDE  5 mg Oral Daily  . latanoprost  1 drop Both Eyes QHS  . levothyroxine  100 mcg Oral QAC breakfast  . losartan  100 mg Oral Daily  . pantoprazole  40 mg Oral Daily  . simvastatin  20 mg Oral QHS  . sotalol  80 mg Oral BID  . vitamin B-12  250 mcg Oral Daily   Infusions:    Labs:  Recent Labs  12/21/14 0741  NA 137  K 3.6  CL 102  CO2 25  GLUCOSE 99  BUN 12  CREATININE 1.17*  CALCIUM 10.2   No results for input(s): AST, ALT, ALKPHOS, BILITOT, PROT, ALBUMIN in the last 72 hours.  Recent Labs  12/21/14 0741 12/22/14 0526  WBC 0.6* 0.6*  NEUTROABS 0.2*  --   HGB 10.1* 8.0*  HCT 30.0* 24.0*  MCV 89.4 88.8  PLT 233 178    Recent Labs  12/21/14 0741  TROPONINI 0.04*   Invalid input(s): POCBNP No results for input(s): HGBA1C in the last 72 hours.   Weights: Filed Weights   12/21/14 0610  Weight: 113 lb 8 oz (51.483 kg)     Radiology/Studies:  No results found.   Assessment and Recommendation  79 y.o. female with known severe aortic valve stenosis and coronary artery disease status post aortic valve replacement and coronary artery  bypass graft with mechanical valve and chronic atrial fibrillation with sick sinus syndrome status post pacemaker placement chronic kidney disease stage III essential hypertension with elevated troponin consistent with demand ischemia rather than acute coronary syndrome and recent weakness and fatigue from chemotherapy and acute myelo leukemia stable from the cardiac standpoint 1. Continue hypertension control with her medical regimen and no change 2. Sotalol for maintenance of normal sinus rhythm and treatment of atrial fibrillation 3. Anticoagulation  for further risk reduction of thrombosis of mechanical aortic valve 4. No further intervention of elevated troponin consistent with demand ischemia 5. No further cardiac diagnostics necessary at this time  Signed, Serafina Royals M.D. FACC

## 2014-12-22 NOTE — Plan of Care (Signed)
Problem: Discharge Progression Outcomes Goal: Other Discharge Outcomes/Goals Outcome: Progressing Pt alert and oriented, no complaints of pain or discomfort. Monitoring labs wbc 0.6, still on neutopenic precautions. 1 unit of prbc infusing, pt tolerating well.

## 2014-12-22 NOTE — Evaluation (Signed)
Physical Therapy Evaluation Patient Details Name: [name] MRN: [id number] DOB: 07-05-1935 Today's Date: 12/22/2014   History of Present Illness  Patient is a 79 y/o female with past medical history of Parkinson's, AML, A-fib, CHF, CAD, and pacemaker placement. She has just completed a course of chemotherapy and continues to have low WBC. Patient admitted after feeling generally weak and having difficulty ambulating at home.   Clinical Impression  Patient is a 79 y/o female with complex medical history, that present with generalized weakness. She has had difficulty ambulating recently, and demonstrates LE weakness, decreased balance, and fatigue with ambulation during the session today. She is able to ambulate household distances without loss of balance, however she fatigues quickly and demonstrates high falls risk characteristics in gait (lateral sway with modified DGI and ambulating with eyes closed). Patient appears to be below her baseline level of strength and safety with mobility and would benefit from HHPT to increase her safety and independence with mobility. Skilled PT services are indicated while the patient is in the acute care setting. From a mobility perspective, patient appears safe to return home with 24 hour assist from husband for mobility needs once she is medically stable.     Follow Up Recommendations Home health PT - 24 hour assistance for mobility preferred, particularly at night.     Equipment Recommendations       Recommendations for Other Services       Precautions / Restrictions Precautions Precautions: Fall Restrictions Weight Bearing Restrictions: No      Mobility  Bed Mobility Overal bed mobility: Modified Independent             General bed mobility comments: Patient uses bed rails to perform supine to sit transfer.   Transfers Overall transfer level: Needs assistance Equipment used: Rolling walker (2 wheeled) Transfers: Sit to/from  Stand Sit to Stand: Min guard         General transfer comment: Patient demonstrates need for verbal cuing for safe hand placement with RW, but no physical assistance.   Ambulation/Gait Ambulation/Gait assistance: Min guard Ambulation Distance (Feet): 200 Feet Assistive device: Rolling walker (2 wheeled) Gait Pattern/deviations: Step-to pattern;Step-through pattern;Decreased step length - right;Decreased step length - left   Gait velocity interpretation: <1.8 ft/sec, indicative of risk for recurrent falls General Gait Details: Patient does not demonstrate typical Parkinsonian gait, however she displays irregularities throughout gait. She alternates between quick step through and slower step to. She demonstrates lateral sway, but no loss of balance. When she ambulates with step through pattern she begins to have minimal foot clearance.   Stairs            Wheelchair Mobility    Modified Rankin (Stroke Patients Only)       Balance Overall balance assessment: Needs assistance Sitting-balance support: Feet unsupported Sitting balance-Leahy Scale: Good     Standing balance support: Bilateral upper extremity supported Standing balance-Leahy Scale: Fair Standing balance comment: Patient is able to take her hands off RW for bringing up underwear, however dynamically she demonstrates poor balance (9/12 on modified DGI indicating higher risk for falling). Significant lateral swaying with eyes closed ambulation, though no loss of balance.                              Pertinent Vitals/Pain Pain Assessment:  (Patient does not complain of pain today, just that she isn't feeling great. )    Home Living Family/patient expects  to be discharged to:: Private residence Living Arrangements: Spouse/significant other Available Help at Discharge: Family Type of Home: House Home Access: [place]: One [place]: Environmental consultant - 2 wheels;Cane - single  point;Bedside commode;Shower seat      Prior Function Level of Independence: Independent with assistive device(s)         Comments: uses SPC     Hand Dominance        Extremity/Trunk Assessment   Upper Extremity Assessment: Overall WFL for tasks assessed           Lower Extremity Assessment: Overall WFL for tasks assessed (4/5 strength in hip flexors/knee extensors generally.)         Communication   Communication: No difficulties;Expressive difficulties (Mildly slurred speech. )  Cognition Arousal/Alertness: Awake/alert Behavior During Therapy: WFL for tasks assessed/performed Overall Cognitive Status: Within Functional Limits for tasks assessed                      General Comments      Exercises        Assessment/Plan    PT Assessment Patient needs continued PT services  PT Diagnosis Difficulty walking;Generalized weakness   PT Problem List Decreased strength;Decreased knowledge of use of DME;Decreased activity tolerance;Decreased balance;Decreased mobility  PT Treatment Interventions Gait training;DME instruction;Balance training;Therapeutic activities;Therapeutic exercise   PT Goals (Current goals can be found in the Care Plan section) Acute Rehab PT Goals Patient Stated Goal: To return home safely when appropriate.  PT Goal Formulation: With patient Time For Goal Achievement: 01/05/15 Potential to Achieve Goals: Good    Frequency Min 2X/week   Barriers to discharge Decreased caregiver support Patient will need supervision with mobility from her husband. He was not present, but it sounds like he will be available 24/7.     Co-evaluation               End of Session Equipment Utilized During Treatment: Gait belt Activity Tolerance: Patient tolerated treatment well;Patient limited by fatigue (Patient requested to halt ambulation after 1 lap secondary to fatigue. ) Patient left: in chair;with call bell/phone within reach;with chair  alarm set Nurse Communication: Mobility status         Time: 1115-5208 PT Time Calculation (min) (ACUTE ONLY): 19 min   Charges:   PT Evaluation $Initial PT Evaluation Tier I: 1 Procedure     PT G Codes:       Kerman Passey, PT, DPT    12/22/2014, 10:26 AM

## 2014-12-23 DIAGNOSIS — E440 Moderate protein-calorie malnutrition: Secondary | ICD-10-CM | POA: Diagnosis present

## 2014-12-23 LAB — CBC WITH DIFFERENTIAL/PLATELET
Basophils Absolute: 0 10*3/uL (ref 0–0.1)
Basophils Relative: 0 %
Eosinophils Absolute: 0.1 10*3/uL (ref 0–0.7)
Eosinophils Relative: 9 %
HEMATOCRIT: 26.1 % — AB (ref 35.0–47.0)
Hemoglobin: 8.8 g/dL — ABNORMAL LOW (ref 12.0–16.0)
LYMPHS ABS: 0.4 10*3/uL — AB (ref 1.0–3.6)
MCH: 29.3 pg (ref 26.0–34.0)
MCHC: 33.7 g/dL (ref 32.0–36.0)
MCV: 86.8 fL (ref 80.0–100.0)
Monocytes Absolute: 0 10*3/uL — ABNORMAL LOW (ref 0.2–0.9)
Neutro Abs: 0.2 10*3/uL — ABNORMAL LOW (ref 1.4–6.5)
Platelets: 178 10*3/uL (ref 150–440)
RBC: 3.01 MIL/uL — ABNORMAL LOW (ref 3.80–5.20)
RDW: 18.8 % — ABNORMAL HIGH (ref 11.5–14.5)
WBC: 0.7 10*3/uL — CL (ref 3.6–11.0)

## 2014-12-23 LAB — TYPE AND SCREEN
ABO/RH(D): O POS
Antibody Screen: NEGATIVE
Unit division: 0

## 2014-12-23 LAB — CREATININE, SERUM
Creatinine, Ser: 0.9 mg/dL (ref 0.44–1.00)
GFR calc Af Amer: >60 mL/min (ref >60)
GFR calc non Af Amer: >60 mL/min (ref >60)

## 2014-12-23 NOTE — Progress Notes (Signed)
Patient ID: [name], female   DOB: May 31, 1936, 79 y.o.   MRN: [id number]   [name] is a 79 y.o. female who was admitted with generalized weakness due to anemia following chemotherapy for AML. Ecology consult reviewed. Patient has received 1 unit of packed cells and feels better. Physical therapy reviewed and suggests home with home physical therapy. Patient is still rather shaky however.  ______________________________________________________________________  ROS: Review of systems is unremarkable for any active cardiac,respiratory, GI, GU, hematologic, neurologic or psychiatric systems, 10 systems reviewed.  Marland Kitchen allopurinol  300 mg Oral Daily  . amLODipine  10 mg Oral Daily  . brimonidine  1 drop Both Eyes BID  . carbidopa-levodopa  1 tablet Oral TID  . cholecalciferol  1,000 Units Oral Daily  . docusate sodium  100 mg Oral BID  . enoxaparin (LOVENOX) injection  1 mg/kg Subcutaneous Q12H  . ferrous sulfate  325 mg Oral Q breakfast  . glipiZIDE  5 mg Oral Daily  . latanoprost  1 drop Both Eyes QHS  . levothyroxine  100 mcg Oral QAC breakfast  . losartan  100 mg Oral Daily  . pantoprazole  40 mg Oral Daily  . simvastatin  20 mg Oral QHS  . sotalol  80 mg Oral BID  . vitamin B-12  250 mcg Oral Daily   acetaminophen **OR** acetaminophen, bisacodyl, heparin lock flush, heparin lock flush, ondansetron **OR** ondansetron (ZOFRAN) IV, oxyCODONE, sodium chloride, sodium chloride   Past Medical History  Diagnosis Date  . Atrial fibrillation   . Parkinson's disease   . Glaucoma   . Hypertension   . Hypothyroidism   . GERD (gastroesophageal reflux disease)   . High cholesterol   . Diabetes mellitus without complication   . CAD (coronary artery disease) of bypass graft   . History of appendectomy   . H/O: hysterectomy   . Leukemia     Past Surgical History  Procedure Laterality Date  . Cardiac valve replacement    . Pacemaker insertion      PHYSICAL EXAM:  BP  125/54 mmHg  Pulse 59  Temp(Src) 98.6 F (37 C) (Oral)  Resp 20  Ht 5\' 2"  (1.575 m)  Wt 51.483 kg (113 lb 8 oz)  BMI 20.75 kg/m2  SpO2 98%  Wt Readings from Last 3 Encounters:  12/21/14 51.483 kg (113 lb 8 oz)  12/05/14 53.1 kg (117 lb 1 oz)  11/19/14 50.8 kg (111 lb 15.9 oz)           BP Readings from Last 3 Encounters:  12/23/14 125/54  12/19/14 157/90  12/05/14 117/71    Constitutional: NAD. Marked dorsal kyphosis. Neck: supple, no thyromegaly Respiratory: CTA, no rales or wheezes Cardiovascular: irregular rhythm with 1-2/6 sem Abdomen: soft, good BS, nontender Extremities: no edema Neuro: alert and oriented, no focal motor or sensory deficits  ASSESSMENT/PLAN:  Labs and imaging studies were reviewed  1. Weakness due to anemia secondary to chemotherapy--hemoglobin is up to 8.8 after transfusion with 1 unit of packed cells.Leukopenia is unchanged. Repeat CBC in the morning. Eventually home with home physical therapy. 2. CAD with chronic atrial fib--cardoilogy consult reviewed. Rate under good control.

## 2014-12-23 NOTE — Plan of Care (Signed)
Problem: Discharge Progression Outcomes Goal: Other Discharge Outcomes/Goals Outcome: Progressing Plan of care progress to goals: 1.Pt plans to d/c home with husband. 2.No c/o pain or discomfort.  3.Monitoring labs wbc 0.7, still on neutopenic precautions. S/P 1 unit of prbc's, pt tolerated well. Hgb now 8.8. VSS. 4.No c/o nausea/vomiting. Tolerating diet.

## 2014-12-23 NOTE — Plan of Care (Signed)
Problem: Discharge Progression Outcomes Goal: Other Discharge Outcomes/Goals Outcome: Progressing Pt alert and oriented, does not complain of pain or discomfort. Remains on neutropenic precautions WBC 0.7, Hgb 8.8. Up to bsc x1 assist, still has generalized weakness.

## 2014-12-24 ENCOUNTER — Inpatient Hospital Stay: Payer: Medicare Other | Admitting: Oncology

## 2014-12-24 ENCOUNTER — Inpatient Hospital Stay: Payer: Medicare Other

## 2014-12-24 DIAGNOSIS — D61818 Other pancytopenia: Secondary | ICD-10-CM

## 2014-12-24 DIAGNOSIS — G620 Drug-induced polyneuropathy: Secondary | ICD-10-CM

## 2014-12-24 LAB — CBC WITH DIFFERENTIAL/PLATELET
Basophils Absolute: 0 10*3/uL (ref 0–0.1)
Basophils Relative: 1 %
Eosinophils Absolute: 0 10*3/uL (ref 0–0.7)
Eosinophils Relative: 7 %
HCT: 25.1 % — ABNORMAL LOW (ref 35.0–47.0)
Hemoglobin: 8.4 g/dL — ABNORMAL LOW (ref 12.0–16.0)
Lymphocytes Relative: 58 %
Lymphs Abs: 0.4 10*3/uL — ABNORMAL LOW (ref 1.0–3.6)
MCH: 29.1 pg (ref 26.0–34.0)
MCHC: 33.6 g/dL (ref 32.0–36.0)
MCV: 86.7 fL (ref 80.0–100.0)
Monocytes Absolute: 0 10*3/uL — ABNORMAL LOW (ref 0.2–0.9)
Monocytes Relative: 1 %
Neutro Abs: 0.2 10*3/uL — ABNORMAL LOW (ref 1.4–6.5)
Neutrophils Relative %: 33 %
Platelets: 157 10*3/uL (ref 150–440)
RBC: 2.89 MIL/uL — ABNORMAL LOW (ref 3.80–5.20)
RDW: 18.6 % — ABNORMAL HIGH (ref 11.5–14.5)
WBC: 0.7 10*3/uL — CL (ref 3.6–11.0)

## 2014-12-24 MED ORDER — CARBIDOPA-LEVODOPA 25-100 MG PO TABS
1.0000 | ORAL_TABLET | Freq: Four times a day (QID) | ORAL | Status: DC
  Administered 2014-12-24 – 2014-12-26 (×8): 1 via ORAL
  Filled 2014-12-24 (×9): qty 1

## 2014-12-24 NOTE — Plan of Care (Signed)
Problem: Discharge Progression Outcomes Goal: Other Discharge Outcomes/Goals Outcome: Progressing Plan of care progress to goal for: PT treatment done today, possible SNF placement due to sudden weakness in legs.  No c/o pain. Tolerating diet well. Neuro consult ordered and sinemet changed to 4x/day.

## 2014-12-24 NOTE — Progress Notes (Signed)
[name]  [phone] [phone]  ID: [name] OB: 10/31/1935  MR#: [id number]  QMV#:[id number]  Patient Care Team: Adin Hector, MD as PCP - General (Internal Medicine)  CHIEF COMPLAINT:  Chief Complaint  Patient presents with  . Weakness    INTERVAL HISTORY: Patient admitted over the weekend with generalized weakness. She received her second unit of blood with improvement of her hemoglobin. Patient states she still feels weak and fatigued, but improved. She denies any fevers. She has no chest pain or shortness of breath. Patient offers no further specific complaints.  REVIEW OF SYSTEMS:   Review of Systems  Constitutional: Negative.     As per HPI. Otherwise, a complete review of systems is negatve.  PAST MEDICAL HISTORY: Past Medical History  Diagnosis Date  . Atrial fibrillation   . Parkinson's disease   . Glaucoma   . Hypertension   . Hypothyroidism   . GERD (gastroesophageal reflux disease)   . High cholesterol   . Diabetes mellitus without complication   . CAD (coronary artery disease) of bypass graft   . History of appendectomy   . H/O: hysterectomy   . Leukemia     PAST SURGICAL HISTORY: Past Surgical History  Procedure Laterality Date  . Cardiac valve replacement    . Pacemaker insertion      FAMILY HISTORY Family History  Problem Relation Age of Onset  . CAD Other   . Heart disease Father        ADVANCED DIRECTIVES:    HEALTH MAINTENANCE: History  Substance Use Topics  . Smoking status: Former Smoker -- 1.00 packs/day for 10 years    Types: Cigarettes  . Smokeless tobacco: Not on file  . Alcohol Use: No     Colonoscopy:  PAP:  Bone density:  Lipid panel:  Allergies  Allergen Reactions  . Hydrochlorothiazide Other (See Comments)    Worsening hypercalcemia Worsening hypercalcemia Reaction:  Unknown   . Lisinopril Cough and Other (See Comments)    Worsening renal insufficiency  at 40mg  dose Other reaction(s): Other (See Comments) Worsening renal insufficiency at 40mg  dose  . Losartan Other (See Comments)    Worsened renal insufficiency Other reaction(s): Other (See Comments) Worsened renal insufficiency Reaction:  Unknown     Current Facility-Administered Medications  Medication Dose Route Frequency Provider Last Rate Last Dose  . acetaminophen (TYLENOL) tablet 650 mg  650 mg Oral Q6H PRN Fritzi Mandes, MD   650 mg at 12/22/14 0051   Or  . acetaminophen (TYLENOL) suppository 650 mg  650 mg Rectal Q6H PRN Fritzi Mandes, MD      . allopurinol (ZYLOPRIM) tablet 300 mg  300 mg Oral Daily Fritzi Mandes, MD   300 mg at 12/24/14 2841  . bisacodyl (DULCOLAX) EC tablet 5 mg  5 mg Oral Daily PRN Fritzi Mandes, MD   5 mg at 12/23/14 2158  . brimonidine (ALPHAGAN) 0.15 % ophthalmic solution 1 drop  1 drop Both Eyes BID Fritzi Mandes, MD   1 drop at 12/24/14 1128  . carbidopa-levodopa (SINEMET IR) 25-100 MG per tablet immediate release 1 tablet  1 tablet Oral TID Fritzi Mandes, MD   1 tablet at 12/24/14 3244  . cholecalciferol (VITAMIN D) tablet 1,000 Units  1,000 Units Oral Daily Fritzi Mandes, MD   1,000 Units at 12/24/14 [phone]  . docusate sodium (COLACE) capsule 100 mg  100 mg Oral BID Fritzi Mandes, MD   100 mg at 12/24/14 7253  .  enoxaparin (LOVENOX) injection 50 mg  1 mg/kg Subcutaneous Q12H Lottie Mussel III, MD   50 mg at 12/24/14 2130  . ferrous sulfate tablet 325 mg  325 mg Oral Q breakfast Fritzi Mandes, MD   325 mg at 12/24/14 8657  . glipiZIDE (GLUCOTROL XL) 24 hr tablet 5 mg  5 mg Oral Daily Fritzi Mandes, MD   5 mg at 12/24/14 8469  . heparin lock flush 100 unit/mL  500 Units Intracatheter Daily PRN Leia Alf, MD      . heparin lock flush 100 unit/mL  250 Units Intracatheter PRN Leia Alf, MD      . latanoprost (XALATAN) 0.005 % ophthalmic solution 1 drop  1 drop Both Eyes QHS Fritzi Mandes, MD   1 drop at 12/23/14 2158  . levothyroxine (SYNTHROID, LEVOTHROID) tablet 100 mcg  100 mcg  Oral QAC breakfast Fritzi Mandes, MD   100 mcg at 12/24/14 6295  . losartan (COZAAR) tablet 100 mg  100 mg Oral Daily Fritzi Mandes, MD   100 mg at 12/24/14 0831  . ondansetron (ZOFRAN) tablet 4 mg  4 mg Oral Q6H PRN Fritzi Mandes, MD       Or  . ondansetron (ZOFRAN) injection 4 mg  4 mg Intravenous Q6H PRN Fritzi Mandes, MD      . oxyCODONE (Oxy IR/ROXICODONE) immediate release tablet 5 mg  5 mg Oral Q4H PRN Fritzi Mandes, MD      . pantoprazole (PROTONIX) EC tablet 40 mg  40 mg Oral Daily Fritzi Mandes, MD   40 mg at 12/24/14 0831  . simvastatin (ZOCOR) tablet 20 mg  20 mg Oral QHS Fritzi Mandes, MD   20 mg at 12/23/14 2157  . sodium chloride 0.9 % injection 10 mL  10 mL Intracatheter PRN Leia Alf, MD      . sodium chloride 0.9 % injection 3 mL  3 mL Intracatheter PRN Leia Alf, MD      . sotalol (BETAPACE) tablet 80 mg  80 mg Oral BID Fritzi Mandes, MD   80 mg at 12/24/14 2841  . vitamin B-12 (CYANOCOBALAMIN) tablet 250 mcg  250 mcg Oral Daily Fritzi Mandes, MD   250 mcg at 12/23/14 1629   Facility-Administered Medications Ordered in Other Encounters  Medication Dose Route Frequency Provider Last Rate Last Dose  . 0.9 %  sodium chloride infusion   Intravenous Continuous Lloyd Huger, MD 999 mL/hr at 10/29/14 1550      OBJECTIVE: Filed Vitals:   12/24/14 0830  BP: 135/47  Pulse: 92  Temp: 98.2 F (36.8 C)  Resp:      Body mass index is 20.75 kg/(m^2).    ECOG FS:2 - Symptomatic, <50% confined to bed  General: thin, no acute distress. Eyes: anicteric sclera. Lungs: Clear to auscultation bilaterally. Heart: Regular rate and rhythm. No rubs, murmurs, or gallops. Abdomen: Soft, nontender, nondistended. No organomegaly noted, normoactive bowel sounds. Musculoskeletal: No edema, cyanosis, or clubbing. Neuro: Alert, answering all questions appropriately. Cranial nerves grossly intact. Skin: No rashes or petechiae noted. Psych: Normal affect.   LAB RESULTS:  Lab Results  Component Value  Date   NA 137 12/21/2014   K 3.6 12/21/2014   CL 102 12/21/2014   CO2 25 12/21/2014   GLUCOSE 99 12/21/2014   BUN 12 12/21/2014   CREATININE 0.90 12/23/2014   CALCIUM 10.2 12/21/2014   PROT 6.9 11/28/2014   ALBUMIN 3.6 11/28/2014   AST 11* 11/28/2014   ALT <5* 11/28/2014  ALKPHOS 70 11/28/2014   BILITOT 0.5 11/28/2014   GFRNONAA >60 12/23/2014   GFRAA >60 12/23/2014    Lab Results  Component Value Date   WBC 0.7* 12/24/2014   NEUTROABS 0.2* 12/24/2014   HGB 8.4* 12/24/2014   HCT 25.1* 12/24/2014   MCV 86.7 12/24/2014   PLT 157 12/24/2014     STUDIES: No results found.  ASSESSMENT: AML, pancytopenia, weakness fatigue.  PLAN:    1. AML: Patient has received 4 cycles of chemotherapy with vidaza. Cycle 5 was scheduled to start today, but will delay this 1 week. Patient has been instructed to follow-up in the [facility] on December 31, 2014 for further evaluation and discussion of continuation of treatment. Previously, hospice has been discussed but patient does not wish to pursue this at this time. 2. Pancytopenia: Multifactorial secondary to acute leukemia as well as chemotherapy. Hemoglobin improved with transfusions. No further intervention is needed at this time. 3. Disposition: Okay to discharge from an oncology standpoint with follow-up as above.   Lloyd Huger, MD   12/24/2014 12:22 PM

## 2014-12-24 NOTE — Progress Notes (Signed)
Patient ID: [name], female   DOB: [birth year], 79 y.o.   MRN: [id number] SUBJECTIVE:  Pt with h/o AML, refractory to prior treatment, now s/p cycle # 4 of latest chemotherapy; presented with generalized weakness, with pancytopenia. Pt with chills/feeling cold as well as elevated troponin.  Evaluated by cardiology; felt to have demand ischemia as source of troponin elevation. S/p transfusion with improvement in weakness, similar to prior hospitalizations.  No fever, chills, shaking reported; notes that she is always cold.  Denies other c/o this AM.  ______________________________________________________________________  ROS: Please see HPI; remainder of complete 10 point ROS is negative   Past Medical History  Diagnosis Date  . Atrial fibrillation   . Parkinson's disease   . Glaucoma   . Hypertension   . Hypothyroidism   . GERD (gastroesophageal reflux disease)   . High cholesterol   . Diabetes mellitus without complication   . CAD (coronary artery disease) of bypass graft   . History of appendectomy   . H/O: hysterectomy   . Leukemia     Past Surgical History  Procedure Laterality Date  . Cardiac valve replacement    . Pacemaker insertion       Current facility-administered medications:  .  acetaminophen (TYLENOL) tablet 650 mg, 650 mg, Oral, Q6H PRN, 650 mg at 12/22/14 0051 **OR** acetaminophen (TYLENOL) suppository 650 mg, 650 mg, Rectal, Q6H PRN, Fritzi Mandes, MD .  allopurinol (ZYLOPRIM) tablet 300 mg, 300 mg, Oral, Daily, Fritzi Mandes, MD, 300 mg at 12/23/14 0816 .  bisacodyl (DULCOLAX) EC tablet 5 mg, 5 mg, Oral, Daily PRN, Fritzi Mandes, MD, 5 mg at 12/23/14 2158 .  brimonidine (ALPHAGAN) 0.15 % ophthalmic solution 1 drop, 1 drop, Both Eyes, BID, Fritzi Mandes, MD, 1 drop at 12/23/14 2158 .  carbidopa-levodopa (SINEMET IR) 25-100 MG per tablet immediate release 1 tablet, 1 tablet, Oral, TID, Fritzi Mandes, MD, 1 tablet at 12/23/14 2157 .  cholecalciferol (VITAMIN D) tablet  1,000 Units, 1,000 Units, Oral, Daily, Fritzi Mandes, MD, 1,000 Units at 12/23/14 0817 .  docusate sodium (COLACE) capsule 100 mg, 100 mg, Oral, BID, Fritzi Mandes, MD, 100 mg at 12/23/14 2158 .  enoxaparin (LOVENOX) injection 50 mg, 1 mg/kg, Subcutaneous, Q12H, Lottie Mussel III, MD, 50 mg at 12/23/14 2157 .  ferrous sulfate tablet 325 mg, 325 mg, Oral, Q breakfast, Fritzi Mandes, MD, 325 mg at 12/23/14 0817 .  glipiZIDE (GLUCOTROL XL) 24 hr tablet 5 mg, 5 mg, Oral, Daily, Fritzi Mandes, MD, 5 mg at 12/23/14 0817 .  heparin lock flush 100 unit/mL, 500 Units, Intracatheter, Daily PRN, Leia Alf, MD .  heparin lock flush 100 unit/mL, 250 Units, Intracatheter, PRN, Leia Alf, MD .  latanoprost (XALATAN) 0.005 % ophthalmic solution 1 drop, 1 drop, Both Eyes, QHS, Fritzi Mandes, MD, 1 drop at 12/23/14 2158 .  levothyroxine (SYNTHROID, LEVOTHROID) tablet 100 mcg, 100 mcg, Oral, QAC breakfast, Fritzi Mandes, MD, 100 mcg at 12/23/14 0817 .  losartan (COZAAR) tablet 100 mg, 100 mg, Oral, Daily, Fritzi Mandes, MD, 100 mg at 12/23/14 0817 .  ondansetron (ZOFRAN) tablet 4 mg, 4 mg, Oral, Q6H PRN **OR** ondansetron (ZOFRAN) injection 4 mg, 4 mg, Intravenous, Q6H PRN, Fritzi Mandes, MD .  oxyCODONE (Oxy IR/ROXICODONE) immediate release tablet 5 mg, 5 mg, Oral, Q4H PRN, Fritzi Mandes, MD .  pantoprazole (PROTONIX) EC tablet 40 mg, 40 mg, Oral, Daily, Fritzi Mandes, MD, 40 mg at 12/23/14 0817 .  simvastatin (ZOCOR) tablet 20 mg, 20 mg, Oral,  Johnna Acosta, MD, 20 mg at 12/23/14 2157 .  sodium chloride 0.9 % injection 10 mL, 10 mL, Intracatheter, PRN, Leia Alf, MD .  sodium chloride 0.9 % injection 3 mL, 3 mL, Intracatheter, PRN, Leia Alf, MD .  sotalol (BETAPACE) tablet 80 mg, 80 mg, Oral, BID, Fritzi Mandes, MD, 80 mg at 12/23/14 2157 .  vitamin B-12 (CYANOCOBALAMIN) tablet 250 mcg, 250 mcg, Oral, Daily, Fritzi Mandes, MD, 250 mcg at 12/23/14 1629  Facility-Administered Medications Ordered in Other Encounters:  .  0.9 %   sodium chloride infusion, , Intravenous, Continuous, Lloyd Huger, MD, Last Rate: 999 mL/hr at 10/29/14 1550  PHYSICAL EXAM:  BP 97/65 mmHg  Pulse 86  Temp(Src) 97.6 F (36.4 C) (Oral)  Resp 18  Ht 5\' 2"  (1.575 m)  Wt 51.483 kg (113 lb 8 oz)  BMI 20.75 kg/m2  SpO2 94%  General: pleasant  female, in NAD HEENT: PERRL; conjunctiva pale;OP moist without lesions. Neck: supple, trachea midline, no thyromegaly Chest: normal to palpation Lungs: few crackles without retractions or wheezes Cardiovascular: mechanical heart valve, 2/6 SEM Abdomen: soft, nontender, nondistended, positive bowel sounds Extremities: no clubbing, cyanosis.  No sig edema Neuro: alert, moves all extremities Derm: no significant rashes or nodules; decreased skin turgor Lymph: no cervical or supraclavicular lymphadenopathy  Labs and imaging studies were reviewed  ASSESSMENT/PLAN:   1. Pancytopenia, chemotherapy induced- monitoring counts; hematology to see and discuss any further treatment.  Was seen at last hospitalization by palliative care, with some consideration for Hospice.   2. Elevated troponin- due to demand ischemia.  BP slightly low and will hold amlodipine 3. mech heart valve- on lovenox long term due to heart valve.   4. DM- cover with SSI 5. CAD- cont statin; on lovenox so no ASA.  On sotalol for this and h/o afib. 6. Code status- Pt was DNR at last hospitalization; discussed with her today, and this is still her wish.  Order entered.

## 2014-12-24 NOTE — Progress Notes (Signed)
Physical Therapy Treatment Patient Details Name: [name] MRN: [id number] DOB: February 24, 1936 Today's Date: 12/24/2014    History of Present Illness Patient is a 79 y/o female with past medical history of Parkinson's, AML, A-fib, CHF, CAD, and pacemaker placement. She has just completed a course of chemotherapy and continues to have low WBC. Patient admitted after feeling generally weak and having difficulty ambulating at home.     PT Comments    Patient initially states she has had increasing number of diarrhea episodes, and has one again as PT begins there-ex. Patient was mod I with bed mobility today and required cga x 1 to transfer to bedside commode. Given her GI complaints she opted to stay near the room for mobility today. She is able to ambulate without loss of balance, though slowly with a RW. Of concern is that as patient was returning to her bedside chair her legs began to "buckle" and felt very weak. She states this has happened multiple times over the past few weeks and does not seem "Parkinsonian" in nature as she can anticipate the weakness or at least recognize it without the repeated intention tremors noted in "freezing" episodes. She states she just "all of a sudden" feels weak and cannot adequately control her LEs. Given this, patient would likely benefit from skilled short term rehabilitation to increase her LE strength and balance to reduce her risk of falling. Patient demonstrated high falls risk with 5x sit to stand time of 22 seconds.   Follow Up Recommendations  SNF     Equipment Recommendations       Recommendations for Other Services       Precautions / Restrictions    Fall    Mobility  Bed Mobility Overal bed mobility: Modified Independent             General bed mobility comments: Patient uses bed rails to perform supine to sit transfer.   Transfers Overall transfer level: Needs assistance Equipment used: Rolling walker (2 wheeled) Transfers: Sit  to/from Stand Sit to Stand: Min guard         General transfer comment: Patient requires extra time and use of UEs to transfer from sit to stand safely.   Ambulation/Gait Ambulation/Gait assistance: Min guard Ambulation Distance (Feet): 100 Feet Assistive device: Rolling walker (2 wheeled) Gait Pattern/deviations: Step-to pattern;Step-through pattern;Decreased step length - right;Decreased step length - left     General Gait Details: Patient not noted to drift today, given her diarrhea complaints she asks not to go in the hallway for ambulation today. Given the multiple turns, she performs slowly and takes small steps initially to ambulate, but is able to increase her step length with vc's. Of note as patient was going to sit she called out "don't let me fall" and was noted to have significantly decreased LE strength and began having her legs "dance". She states this has happened 3-4 times now in the last 2 weeks.    Stairs            Wheelchair Mobility    Modified Rankin (Stroke Patients Only)       Balance Overall balance assessment: Needs assistance Sitting-balance support: Feet unsupported Sitting balance-Leahy Scale: Good Sitting balance - Comments: She is able to lean forward and perform cleaning on bedside commode without loss of balance.    Standing balance support: Bilateral upper extremity supported Standing balance-Leahy Scale: Fair Standing balance comment: Patient displayed good balance today aside from the episode in which her  legs began "dancing" and patient lost strength in her LEs requiring PT to assist her to sitting.                     Cognition Arousal/Alertness: Awake/alert Behavior During Therapy: WFL for tasks assessed/performed;Flat affect Overall Cognitive Status: Within Functional Limits for tasks assessed                      Exercises Other Exercises Other Exercises: SLRs x 8 bilaterally  Other Exercises: Sit to stand x  10 with cga x 1, patient noted to have difficulty in coming fully upright, and required cuing to do so.  Other Exercises: Standing hip marches x 10 bilaterally with bilateral HHA     General Comments General comments (skin integrity, edema, etc.): 5x sit to stand time - 22 seconds, indicating she is at high falls risk.       Pertinent Vitals/Pain Pain Assessment:  (Patient states she is not feeling as weak as she was previously with no pain noted. )    Home Living                      Prior Function            PT Goals (current goals can now be found in the care plan section) Acute Rehab PT Goals Patient Stated Goal: To return home safely when appropriate.  PT Goal Formulation: With patient Time For Goal Achievement: 01/05/15 Potential to Achieve Goals: Good Progress towards PT goals: Progressing toward goals    Frequency  Min 2X/week    PT Plan Discharge plan needs to be updated    Co-evaluation             End of Session Equipment Utilized During Treatment: Gait belt Activity Tolerance: Patient tolerated treatment well;Patient limited by fatigue Patient left: in chair;with call bell/phone within reach;with chair alarm set     Time: 6599-3570 PT Time Calculation (min) (ACUTE ONLY): 33 min  Charges:  $Gait Training: 8-22 mins $Therapeutic Activity: 8-22 mins                    G Codes:      Kerman Passey, PT, DPT    12/24/2014, 4:48 PM

## 2014-12-24 NOTE — Progress Notes (Signed)
Notified MD of patients condition while with PT. Ordered sinemet 4x/day instead of 3x/day and neuro consult.

## 2014-12-24 NOTE — Plan of Care (Signed)
Problem: Discharge Progression Outcomes Goal: Other Discharge Outcomes/Goals Outcome: Progressing Plan of care progress to goals: 1.Pt plans to d/c home with husband. 2.No c/o pain or discomfort.   3.Monitoring labs, still on neutopenic precautions. VSS. 4.No c/o nausea/vomiting. Tolerating diet. 5.Off unit telemetry Atrial paced.

## 2014-12-24 NOTE — Telephone Encounter (Signed)
Called KJ and informed her that pt is in hospital at [facility]; Faxed EKG onc and Cardiac consults to her at [phone]

## 2014-12-24 NOTE — Care Management Important Message (Signed)
Important Message  Patient Details  Name: [name] MRN: [id number] Date of Birth: 06-Nov-1935   Medicare Important Message Given:  Yes-second notification given    [name] 12/24/2014, 2:09 PM

## 2014-12-25 ENCOUNTER — Inpatient Hospital Stay
Admit: 2014-12-25 | Discharge: 2014-12-25 | Disposition: A | Discharge status: Home / Self Care | Payer: Medicare Other | Attending: Internal Medicine | Admitting: Internal Medicine

## 2014-12-25 LAB — CBC
HCT: 25.1 % — ABNORMAL LOW (ref 35.0–47.0)
Hemoglobin: 8.5 g/dL — ABNORMAL LOW (ref 12.0–16.0)
MCH: 29.3 pg (ref 26.0–34.0)
MCHC: 33.7 g/dL (ref 32.0–36.0)
MCV: 86.8 fL (ref 80.0–100.0)
Platelets: 151 10*3/uL (ref 150–440)
RBC: 2.89 MIL/uL — ABNORMAL LOW (ref 3.80–5.20)
RDW: 18.4 % — ABNORMAL HIGH (ref 11.5–14.5)
WBC: 0.8 10*3/uL — AB (ref 3.6–11.0)

## 2014-12-25 LAB — GLUCOSE, CAPILLARY
GLUCOSE-CAPILLARY: 127 mg/dL — AB (ref 65–99)
Glucose-Capillary: 59 mg/dL — ABNORMAL LOW (ref 65–99)
Glucose-Capillary: 76 mg/dL (ref 65–99)
Glucose-Capillary: 106 mg/dL — ABNORMAL HIGH (ref 65–99)

## 2014-12-25 LAB — HEPATIC FUNCTION PANEL
ALBUMIN: 2.7 g/dL — AB (ref 3.5–5.0)
ALT: <5 U/L — ABNORMAL LOW (ref 14–54)
AST: 7 U/L — ABNORMAL LOW (ref 15–41)
Alkaline Phosphatase: 53 U/L (ref 38–126)
BILIRUBIN TOTAL: 0.5 mg/dL (ref 0.3–1.2)
Total Protein: 5.6 g/dL — ABNORMAL LOW (ref 6.5–8.1)

## 2014-12-25 LAB — BASIC METABOLIC PANEL
Anion gap: 7 (ref 5–15)
BUN: 12 mg/dL (ref 6–20)
CO2: 25 mmol/L (ref 22–32)
CREATININE: 0.81 mg/dL (ref 0.44–1.00)
Calcium: 9.2 mg/dL (ref 8.9–10.3)
Chloride: 106 mmol/L (ref 101–111)
GFR calc Af Amer: >60 mL/min (ref >60)
Glucose, Bld: 53 mg/dL — ABNORMAL LOW (ref 65–99)
Potassium: 3.6 mmol/L (ref 3.5–5.1)
Sodium: 138 mmol/L (ref 135–145)

## 2014-12-25 MED ORDER — INSULIN ASPART 100 UNIT/ML ~~LOC~~ SOLN
0.0000 [IU] | Freq: Three times a day (TID) | SUBCUTANEOUS | Status: DC
  Administered 2014-12-25: 2 [IU] via SUBCUTANEOUS
  Filled 2014-12-25: qty 2

## 2014-12-25 NOTE — Clinical Social Work Placement (Signed)
   CLINICAL SOCIAL WORK PLACEMENT  NOTE  Date:  12/25/2014  Patient Details  Name: [name] MRN: [id number] Date of Birth: [birth year]  Clinical Social Work is seeking post-discharge placement for this patient at the [place] level of care (*CSW will initial, date and re-position this form in  chart as items are completed):  Yes   Patient/family provided with [facility] Work Department's list of facilities offering this level of care within the geographic area requested by the patient (or if unable, by the patient's family).  Yes   Patient/family informed of their freedom to choose among providers that offer the needed level of care, that participate in Medicare, Medicaid or managed care program needed by the patient, have an available bed and are willing to accept the patient.  Yes   Patient/family informed of [facility]'s ownership interest in [facility] and [facility], as well as of the fact that they are under no obligation to receive care at these facilities.  PASRR submitted to EDS on       PASRR number received on       Existing PASRR number confirmed on 12/25/14     FL2 transmitted to all facilities in geographic area requested by pt/family on 12/25/14     FL2 transmitted to all facilities within larger geographic area on       Patient informed that his/her managed care company has contracts with or will negotiate with certain facilities, including the following:            Patient/family informed of bed offers received.  Patient chooses bed at       Physician recommends and patient chooses bed at      Patient to be transferred to   on  .  Patient to be transferred to facility by       Patient family notified on   of transfer.  Name of family member notified:        PHYSICIAN       Additional Comment:    _______________________________________________ Darden Dates, LCSW 12/25/2014, 10:34 AM

## 2014-12-25 NOTE — Progress Notes (Signed)
*  PRELIMINARY RESULTS* Echocardiogram 2D Echocardiogram has been performed.  [name] 12/25/2014, 9:09 AM

## 2014-12-25 NOTE — Clinical Social Work Note (Signed)
Clinical Social Worker met with pt and husband to provide bed offers. RNCM was present. PT has changed recommendation to home health PT. Pt and husband are in agreement. CSW is signing off as no further needs identified. Please reconsult if a need arises prior to discharge.   Darden Dates, MSW, LCSW Clinical Social Worker  [phone]

## 2014-12-25 NOTE — Clinical Social Work Note (Signed)
Clinical Social Work Assessment  Patient Details  Name: [name] MRN: [id number] Date of Birth: 03/23/1936  Date of referral:  12/25/14               Reason for consult:  Facility Placement                Permission sought to share information with:  Family Supports Permission granted to share information::  Yes, Verbal Permission Granted   Housing/Transportation Living arrangements for the past 2 months:  [place] of Information:  Patient Patient Interpreter Needed:  None Criminal Activity/Legal Involvement Pertinent to Current Situation/Hospitalization:  No - Comment as needed Significant Relationships:  Spouse Lives with:  Spouse Do you feel safe going back to the place where you live?  Yes Need for family participation in patient care:  No (Coment)  Care giving concerns:  No caregiver needs identified.    Social Worker assessment / plan:  CSW met with pt to address consult for SNF, as recommended by PT. CSW introduced herself and explained role of social work. CSW also explained the process of discharging to SNF with Medicare. Pt lives with her husband of 63 years. Pt's husband transfer her to the [facility] for treatment. Pt's husband will be able to do so while pt is at [facility], per pt. Pt is agreeable to SNF placement.   CSW completed FL2 and it is placed on the chart for MD's signature. CSW intiated SNF serach and will follow up with bed offers. CSW will continue to follow.   Employment status:  Retired Forensic scientist:   Medicare PT Recommendations:  [place] / Referral to community resources:  [place]  Patient/Family's Response to care:  Pt was verypleasant and appreciative of CSW support.   Patient/Family's Understanding of and Emotional Response to Diagnosis, Current Treatment, and Prognosis:  Pt understands that STR at SNF would benefit pt prior to returning home.   Emotional  Assessment Appearance:  Appears older than stated age Attitude/Demeanor/Rapport:   (Pleasant) Affect (typically observed):  Accepting, Adaptable Orientation:  Oriented to Self, Oriented to Place, Oriented to  Time, Oriented to Situation Alcohol / Substance use:  Never Used Psych involvement (Current and /or in the community):  No (Comment)  Discharge Needs  Concerns to be addressed:  No discharge needs identified Readmission within the last 30 days:  No Current discharge risk:  Chronically ill Barriers to Discharge:  No Barriers Identified   Darden Dates, LCSW 12/25/2014, 10:39 AM

## 2014-12-25 NOTE — Plan of Care (Signed)
Problem: Discharge Progression Outcomes Goal: Other Discharge Outcomes/Goals Outcome: Progressing Plan of care Progress to Goal: Pt wked w/PT today - walked around nurse's stn.  PT now recommends going home w/home health/PT.  Pt states improvement.  No c/o pain.  WBC slightly improved to .8 up from  .6.  Probable d/c tomorrow.

## 2014-12-25 NOTE — Progress Notes (Signed)
Patient ID: [name], female   DOB: 05/29/1936, 79 y.o.   MRN: [id number] SUBJECTIVE:  Pt with h/o AML, refractory to prior treatment, now s/p cycle # 4 of latest chemotherapy; presented with generalized weakness, with pancytopenia. Pt with chills/feeling cold as well as elevated troponin.  Evaluated by cardiology; felt to have demand ischemia as source of troponin elevation. S/p transfusion with improvement in weakness, similar to prior hospitalizations. Noted onset of loose stool yesterday, but reports it started after getting a laxative, and has been on colace.  Had episode of sudden onset weakness; both legs per pt. PT reported legs shaking.  Pt reports this happens at home, only when she's standing, and will immediately improve once she sits down.  She is challengingly vague when trying to obtain further details. No incontinence with this; no numbness.  Continues to feel generally weak. ______________________________________________________________________  ROS: Please see HPI; remainder of complete 10 point ROS is negative   Past Medical History  Diagnosis Date  . Atrial fibrillation   . Parkinson's disease   . Glaucoma   . Hypertension   . Hypothyroidism   . GERD (gastroesophageal reflux disease)   . High cholesterol   . Diabetes mellitus without complication   . CAD (coronary artery disease) of bypass graft   . History of appendectomy   . H/O: hysterectomy   . Leukemia     Past Surgical History  Procedure Laterality Date  . Cardiac valve replacement    . Pacemaker insertion       Current facility-administered medications:  .  acetaminophen (TYLENOL) tablet 650 mg, 650 mg, Oral, Q6H PRN, 650 mg at 12/22/14 0051 **OR** acetaminophen (TYLENOL) suppository 650 mg, 650 mg, Rectal, Q6H PRN, Fritzi Mandes, MD .  allopurinol (ZYLOPRIM) tablet 300 mg, 300 mg, Oral, Daily, Fritzi Mandes, MD, 300 mg at 12/24/14 0017 .  bisacodyl (DULCOLAX) EC tablet 5 mg, 5 mg, Oral, Daily PRN, Fritzi Mandes, MD, 5 mg at 12/23/14 2158 .  brimonidine (ALPHAGAN) 0.15 % ophthalmic solution 1 drop, 1 drop, Both Eyes, BID, Fritzi Mandes, MD, 1 drop at 12/24/14 2106 .  carbidopa-levodopa (SINEMET IR) 25-100 MG per tablet immediate release 1 tablet, 1 tablet, Oral, QID, Tama High III, MD, 1 tablet at 12/24/14 2303 .  cholecalciferol (VITAMIN D) tablet 1,000 Units, 1,000 Units, Oral, Daily, Fritzi Mandes, MD, 1,000 Units at 12/24/14 [phone] .  enoxaparin (LOVENOX) injection 50 mg, 1 mg/kg, Subcutaneous, Q12H, Lottie Mussel III, MD, 50 mg at 12/24/14 2108 .  ferrous sulfate tablet 325 mg, 325 mg, Oral, Q breakfast, Fritzi Mandes, MD, 325 mg at 12/24/14 9675 .  heparin lock flush 100 unit/mL, 500 Units, Intracatheter, Daily PRN, Leia Alf, MD .  heparin lock flush 100 unit/mL, 250 Units, Intracatheter, PRN, Leia Alf, MD .  insulin aspart (novoLOG) injection 0-24 Units, 0-24 Units, Subcutaneous, TID AC, Tama High III, MD .  latanoprost (XALATAN) 0.005 % ophthalmic solution 1 drop, 1 drop, Both Eyes, QHS, Fritzi Mandes, MD, 1 drop at 12/24/14 2107 .  levothyroxine (SYNTHROID, LEVOTHROID) tablet 100 mcg, 100 mcg, Oral, QAC breakfast, Fritzi Mandes, MD, 100 mcg at 12/24/14 0832 .  losartan (COZAAR) tablet 100 mg, 100 mg, Oral, Daily, Fritzi Mandes, MD, 100 mg at 12/24/14 0831 .  ondansetron (ZOFRAN) tablet 4 mg, 4 mg, Oral, Q6H PRN **OR** ondansetron (ZOFRAN) injection 4 mg, 4 mg, Intravenous, Q6H PRN, Fritzi Mandes, MD .  oxyCODONE (Oxy IR/ROXICODONE) immediate release tablet 5 mg, 5 mg, Oral,  Q4H PRN, Fritzi Mandes, MD .  pantoprazole (PROTONIX) EC tablet 40 mg, 40 mg, Oral, Daily, Fritzi Mandes, MD, 40 mg at 12/24/14 0831 .  simvastatin (ZOCOR) tablet 20 mg, 20 mg, Oral, QHS, Fritzi Mandes, MD, 20 mg at 12/24/14 2109 .  sodium chloride 0.9 % injection 10 mL, 10 mL, Intracatheter, PRN, Leia Alf, MD .  sodium chloride 0.9 % injection 3 mL, 3 mL, Intracatheter, PRN, Leia Alf, MD .  sotalol (BETAPACE) tablet 80 mg,  80 mg, Oral, BID, Fritzi Mandes, MD, 80 mg at 12/24/14 2109 .  vitamin B-12 (CYANOCOBALAMIN) tablet 250 mcg, 250 mcg, Oral, Daily, Fritzi Mandes, MD, 250 mcg at 12/23/14 1629  Facility-Administered Medications Ordered in Other Encounters:  .  0.9 %  sodium chloride infusion, , Intravenous, Continuous, Lloyd Huger, MD, Last Rate: 999 mL/hr at 10/29/14 1550  PHYSICAL EXAM:  BP 147/57 mmHg  Pulse 59  Temp(Src) 98.5 F (36.9 C) (Oral)  Resp 20  Ht 5\' 2"  (1.575 m)  Wt 51.483 kg (113 lb 8 oz)  BMI 20.75 kg/m2  SpO2 95%  General: pleasant  female, in NAD HEENT: PERRL; conjunctiva pale;OP moist without lesions. Neck: supple, trachea midline, no thyromegaly Chest: normal to palpation Lungs: few crackles without retractions or wheezes Cardiovascular: mechanical heart valve, though more indistinct, with 3/6 SEM Abdomen: soft, nontender, nondistended, positive bowel sounds Extremities: no clubbing, cyanosis.  No sig edema Neuro: alert, moves all extremities, with general weakness Derm: no significant rashes or nodules; decreased skin turgor Lymph: no cervical or supraclavicular lymphadenopathy  Labs and imaging studies were reviewed  ASSESSMENT/PLAN:   1. Pancytopenia, chemotherapy induced- WBC minimally higher; following counts for now.  Oncology has seen; plan is to continue chemotherapy, but delay next cycle for 1 week  2. Elevated troponin- due to demand ischemia.  BP up with amlodipine held.   3. mech heart valve- on lovenox long term due to heart valve.  Valve sounds more muffled; check echo 4. DM- developed hypoglycemia; will stop standing dose meds and cover with SSI 5. CAD- cont statin; on lovenox so no ASA.  On sotalol for this and h/o afib.  Rate controlled 6. Shaking episode- appears exertionally induced; neuro to see.  Parkinson's meds adjusted, but agree this does not sound Parkinsonian. 7. Code status- Pt is DNR 8. D/c plan- needs SNF; CM following.

## 2014-12-25 NOTE — Plan of Care (Signed)
Problem: Discharge Progression Outcomes Goal: Other Discharge Outcomes/Goals Outcome: Progressing Plan of care progress to goal: Pain - pt complains of no pain Hemodynamically stable - vital stable this shift Diet - on carb modified diet Activity - needs assistance when getting up

## 2014-12-25 NOTE — Progress Notes (Signed)
Physical Therapy Treatment Patient Details Name: [name] MRN: [id number] DOB: [birth year] Today's Date: 12/25/2014    History of Present Illness Patient is a 79 y/o female with past medical history of Parkinson's, AML, A-fib, CHF, CAD, and pacemaker placement. She has just completed a course of chemotherapy and continues to have low WBC. Patient admitted after feeling generally weak and having difficulty ambulating at home.     PT Comments    Pt reports her mobility has returned to baseline at this point. She demonstrates good safety and stability during ambulation without LE buckling or instability. Vitals remain WNL throughout ambulation. Patient and family do not want to go to SNF. At this point therapist would not recommend SNF as her current needs are not related to therapy issues and are custodial in nature. In addition pt with low WBC and at increased risk for healthcare acquired infections. Husband confirms he can provide 24/7 assistance for safety. Would recommend [facility] PT as patient never completed her last round of therapy and could benefit from improved strength and balance training. Care manager notified of updated discharge recommendations.    Follow Up Recommendations  Home health PT;Supervision/Assistance - 24 hour     Equipment Recommendations  None recommended by PT (Use rolling walker at all times)    Recommendations for Other Services       Precautions / Restrictions Precautions Precautions: Fall Restrictions Weight Bearing Restrictions: No    Mobility  Bed Mobility Overal bed mobility: Modified Independent             General bed mobility comments: Patient uses bed rails to perform supine to sit transfer. Slightly decreased time but functional. HOB minimally elevated  Transfers Overall transfer level: Needs assistance Equipment used: Rolling walker (2 wheeled) Transfers: Sit to/from Stand Sit to Stand: Supervision         General transfer  comment: Pt demonstrates good safety and stability during transfers. No evidence for imbalance  Ambulation/Gait Ambulation/Gait assistance: Min guard Ambulation Distance (Feet): 220 Feet Assistive device: Rolling walker (2 wheeled)     Gait velocity interpretation: Below normal speed for age/gender General Gait Details: Pt demonstrates good safety and stability with walker. Gait speed is slightly decreased but overall functional for full household mobility. Chair follow performed for safety but pt has no episodes of LE buckling or weakness. Pt states that her current ambulation is at her baseline. She reports self-selected gait speed as "moderate" exertion and requires one standing rest break. Vitals monitored throughout and remain WNL.    Stairs            Wheelchair Mobility    Modified Rankin (Stroke Patients Only)       Balance     Sitting balance-Leahy Scale: Good       Standing balance-Leahy Scale: Fair                      Cognition Arousal/Alertness: Awake/alert Behavior During Therapy: WFL for tasks assessed/performed;Flat affect Overall Cognitive Status: Within Functional Limits for tasks assessed                      Exercises      General Comments        Pertinent Vitals/Pain      Home Living                      Prior Function  PT Goals (current goals can now be found in the care plan section) Acute Rehab PT Goals Patient Stated Goal: To return home safely when appropriate.  PT Goal Formulation: With patient Time For Goal Achievement: 01/05/15 Potential to Achieve Goals: Good Progress towards PT goals: Progressing toward goals    Frequency  Min 2X/week    PT Plan Discharge plan needs to be updated    Co-evaluation             End of Session Equipment Utilized During Treatment: Gait belt Activity Tolerance: Patient tolerated treatment well;Patient limited by fatigue Patient left: in  chair;with call bell/phone within reach;with chair alarm set;with family/visitor present     Time: 2706-2376 PT Time Calculation (min) (ACUTE ONLY): 18 min  Charges:  $Gait Training: 8-22 mins                    G Codes:      Lyndel Safe Huprich PT, DPT   Huprich,Jason 12/25/2014, 3:09 PM

## 2014-12-25 NOTE — Care Management (Signed)
Physical therapy evaluation completed. Recommends home with home health/physical therapy. Discussed agencies with Mr & Ms. [place]. Chose [facility], will update Doreatha Lew RN representative for [facility] and [name]. Shelbie Ammons RN MSN Care Management [phone]

## 2014-12-26 ENCOUNTER — Other Ambulatory Visit: Payer: Self-pay | Admitting: *Deleted

## 2014-12-26 DIAGNOSIS — C9200 Acute myeloblastic leukemia, not having achieved remission: Secondary | ICD-10-CM

## 2014-12-26 DIAGNOSIS — I5032 Chronic diastolic (congestive) heart failure: Secondary | ICD-10-CM | POA: Diagnosis present

## 2014-12-26 LAB — CBC
HCT: 24.6 % — ABNORMAL LOW (ref 35.0–47.0)
Hemoglobin: 8.3 g/dL — ABNORMAL LOW (ref 12.0–16.0)
MCH: 29.2 pg (ref 26.0–34.0)
MCHC: 33.6 g/dL (ref 32.0–36.0)
MCV: 86.6 fL (ref 80.0–100.0)
Platelets: 149 10*3/uL — ABNORMAL LOW (ref 150–440)
RBC: 2.84 MIL/uL — ABNORMAL LOW (ref 3.80–5.20)
RDW: 17.9 % — AB (ref 11.5–14.5)
WBC: 0.8 10*3/uL — CL (ref 3.6–11.0)

## 2014-12-26 LAB — GLUCOSE, CAPILLARY
GLUCOSE-CAPILLARY: 133 mg/dL — AB (ref 65–99)
Glucose-Capillary: 90 mg/dL (ref 65–99)

## 2014-12-26 LAB — PREPARE RBC (CROSSMATCH)

## 2014-12-26 MED ORDER — INSULIN ASPART 100 UNIT/ML ~~LOC~~ SOLN
0.0000 [IU] | Freq: Three times a day (TID) | SUBCUTANEOUS | Status: DC
  Administered 2014-12-26: 2 [IU] via SUBCUTANEOUS
  Filled 2014-12-26: qty 2

## 2014-12-26 MED ORDER — SODIUM CHLORIDE 0.9 % IV SOLN
Freq: Once | INTRAVENOUS | Status: AC
  Administered 2014-12-26: 10:00:00 via INTRAVENOUS

## 2014-12-26 MED ORDER — ENOXAPARIN SODIUM 150 MG/ML ~~LOC~~ SOLN: 50.0000 mg | Freq: Two times a day (BID) | SUBCUTANEOUS | Status: DC

## 2014-12-26 MED ORDER — CARBIDOPA-LEVODOPA 25-100 MG PO TABS: 1.0000 | ORAL_TABLET | Freq: Four times a day (QID) | ORAL | Status: AC

## 2014-12-26 NOTE — Progress Notes (Signed)
Patient ID: [name], female   DOB: 17-Feb-1936, 79 y.o.   MRN: [id number] SUBJECTIVE:  Pt with h/o AML, refractory to prior treatment, now s/p cycle # 4 of latest chemotherapy; presented with generalized weakness, with pancytopenia. Pt with chills/feeling cold as well as elevated troponin.  Evaluated by cardiology; felt to have demand ischemia as source of troponin elevation. S/p transfusion with improvement in weakness, similar to prior hospitalizations. Recent diarrhea better after stopping colace.  Appetite still poor, but ate a little better.   Mild headache this AM; better after tylenol.  No fever, chills overnight.  Did better with PT yesterday; no further leg shaking episodes.  Still feels generally weak. ______________________________________________________________________  ROS: Please see HPI; remainder of complete 10 point ROS is negative   Past Medical History  Diagnosis Date  . Atrial fibrillation   . Parkinson's disease   . Glaucoma   . Hypertension   . Hypothyroidism   . GERD (gastroesophageal reflux disease)   . High cholesterol   . Diabetes mellitus without complication   . CAD (coronary artery disease) of bypass graft   . History of appendectomy   . H/O: hysterectomy   . Leukemia     Past Surgical History  Procedure Laterality Date  . Cardiac valve replacement    . Pacemaker insertion       Current facility-administered medications:  .  0.9 %  sodium chloride infusion, , Intravenous, Once, Tama High III, MD .  acetaminophen (TYLENOL) tablet 650 mg, 650 mg, Oral, Q6H PRN, 650 mg at 12/22/14 0051 **OR** acetaminophen (TYLENOL) suppository 650 mg, 650 mg, Rectal, Q6H PRN, Fritzi Mandes, MD .  allopurinol (ZYLOPRIM) tablet 300 mg, 300 mg, Oral, Daily, Fritzi Mandes, MD, 300 mg at 12/25/14 1607 .  bisacodyl (DULCOLAX) EC tablet 5 mg, 5 mg, Oral, Daily PRN, Fritzi Mandes, MD, 5 mg at 12/23/14 2158 .  brimonidine (ALPHAGAN) 0.15 % ophthalmic solution 1 drop, 1 drop,  Both Eyes, BID, Fritzi Mandes, MD, 1 drop at 12/25/14 2116 .  carbidopa-levodopa (SINEMET IR) 25-100 MG per tablet immediate release 1 tablet, 1 tablet, Oral, QID, Tama High III, MD, 1 tablet at 12/25/14 2117 .  cholecalciferol (VITAMIN D) tablet 1,000 Units, 1,000 Units, Oral, Daily, Fritzi Mandes, MD, 1,000 Units at 12/25/14 0820 .  enoxaparin (LOVENOX) injection 50 mg, 1 mg/kg, Subcutaneous, Q12H, Lottie Mussel III, MD, 50 mg at 12/25/14 2036 .  ferrous sulfate tablet 325 mg, 325 mg, Oral, Q breakfast, Fritzi Mandes, MD, 325 mg at 12/25/14 0824 .  heparin lock flush 100 unit/mL, 500 Units, Intracatheter, Daily PRN, Leia Alf, MD .  heparin lock flush 100 unit/mL, 250 Units, Intracatheter, PRN, Leia Alf, MD .  insulin aspart (novoLOG) injection 0-24 Units, 0-24 Units, Subcutaneous, TID WC, Tama High III, MD .  latanoprost (XALATAN) 0.005 % ophthalmic solution 1 drop, 1 drop, Both Eyes, QHS, Fritzi Mandes, MD, 1 drop at 12/25/14 2119 .  levothyroxine (SYNTHROID, LEVOTHROID) tablet 100 mcg, 100 mcg, Oral, QAC breakfast, Fritzi Mandes, MD, 100 mcg at 12/25/14 0823 .  losartan (COZAAR) tablet 100 mg, 100 mg, Oral, Daily, Fritzi Mandes, MD, 100 mg at 12/25/14 0818 .  ondansetron (ZOFRAN) tablet 4 mg, 4 mg, Oral, Q6H PRN **OR** ondansetron (ZOFRAN) injection 4 mg, 4 mg, Intravenous, Q6H PRN, Fritzi Mandes, MD .  oxyCODONE (Oxy IR/ROXICODONE) immediate release tablet 5 mg, 5 mg, Oral, Q4H PRN, Fritzi Mandes, MD, 5 mg at 12/26/14 0120 .  pantoprazole (PROTONIX) EC  tablet 40 mg, 40 mg, Oral, Daily, Fritzi Mandes, MD, 40 mg at 12/25/14 0818 .  simvastatin (ZOCOR) tablet 20 mg, 20 mg, Oral, QHS, Fritzi Mandes, MD, 20 mg at 12/25/14 2117 .  sodium chloride 0.9 % injection 10 mL, 10 mL, Intracatheter, PRN, Leia Alf, MD .  sodium chloride 0.9 % injection 3 mL, 3 mL, Intracatheter, PRN, Leia Alf, MD .  sotalol (BETAPACE) tablet 80 mg, 80 mg, Oral, BID, Fritzi Mandes, MD, 80 mg at 12/25/14 2117 .  vitamin B-12  (CYANOCOBALAMIN) tablet 250 mcg, 250 mcg, Oral, Daily, Fritzi Mandes, MD, 250 mcg at 12/25/14 3300  Facility-Administered Medications Ordered in Other Encounters:  .  0.9 %  sodium chloride infusion, , Intravenous, Continuous, Lloyd Huger, MD, Last Rate: 999 mL/hr at 10/29/14 1550  PHYSICAL EXAM:  BP 118/55 mmHg  Pulse 60  Temp(Src) 98.5 F (36.9 C) (Oral)  Resp 20  Ht 5\' 2"  (1.575 m)  Wt 51.483 kg (113 lb 8 oz)  BMI 20.75 kg/m2  SpO2 96%  General: pleasant  female, in NAD HEENT: PERRL; conjunctiva pale;OP moist without lesions. Neck: supple, trachea midline, no thyromegaly Chest: normal to palpation Lungs: few crackles without retractions or wheezes Cardiovascular: mechanical heart valve with 3/6 SEM Abdomen: soft, nontender, nondistended, positive bowel sounds Extremities: no clubbing, cyanosis.  No sig edema Neuro: alert, moves all extremities, with general weakness Derm: no significant rashes or nodules; decreased skin turgor Lymph: no cervical or supraclavicular lymphadenopathy  Labs and imaging studies were reviewed  ASSESSMENT/PLAN:   1. Pancytopenia, chemotherapy induced- Hgb down slightly, though likely within realm of test.  Given continued symptoms, will give 1 additional unit of blood.  Oncology has seen; plan is to continue chemotherapy, but delay next cycle for 1 week  2. Elevated troponin- due to demand ischemia.  BP reasonable with reduction in BP meds.   3. mech heart valve- on lovenox long term due to heart valve.  Echo shows LV preserved; no significant abnormalities in mech valve 4. DM- off standing dose meds due to hypoglycemia; cover with SSI 5. CAD- cont statin; on lovenox so no ASA.  On sotalol for this and h/o afib.  Rate remains controlled 6. Shaking episode- await neuro input; may be from fatiguing of muscles.  Additional blood as noted above.  Parkinson's meds adjusted, but agree this does not sound Parkinsonian. 7. Code status- Pt is DNR 8.  D/c plan- did better with PT; plan now is to have pt return home; will reevaluate at lunch hr, after blood, to see if able to go home.

## 2014-12-26 NOTE — Progress Notes (Signed)
New referral for [facility] home health services after discharge received from [place]. Services of nursing for observation and assessment and Physical therapy have been ordered.  [name] has a PMH of AML, A-fib, Parkinson's, HTN, glaucoma, Hypothyroidism, GERD, DM and high cholesterol. She was admitted on 12/21/14 for treatment of chemotherapy induced anemia and has received 3 units of blood, Hb 8.3 this afternoon. Writer met with [name] and her husband [name] with whom she lives. He is very supportive and able to provide care. Address and contact numbers confirmed as correct on hospital face sheet. Current DME in the home:  rolling walker, light weight wheel chair and cane, all of which she owns. She and her husband report that [name] was  to provide a prescription for a rollator walker, per PT evaluation this was not recommended. No prescription in patient's chart. [name] made aware. [facility] brochure given. All Home health orders and F2F along with patient information faxed to referral intake. Thank you for the opportunity to participate in [name]'s care. Flo Shanks RN, BSN, [facility] Liaison [phone] c

## 2014-12-26 NOTE — Plan of Care (Signed)
Problem: Discharge Progression Outcomes Goal: Other Discharge Outcomes/Goals Outcome: Progressing Plan of care progress to goal: Pain - pt complains of minimal pain Hemodynamically stable - VSS stable Diet - pt has no appetite Activity - pt working with pt, calls for assistance to [facility]

## 2014-12-26 NOTE — Clinical Documentation Improvement (Signed)
Per ED Provider Note and Progress Notes 7/18 & 19: "pst medical history of.....Marland KitchenCHF" Per Echo report 6/44/0347: "Systolic function was normal. The estimated ejection fraction was in the range of 55-60%"  Please specify in your Progress Note further specificity of this patient's CHF if possible, for example:   Chronic Diastolic Congestive Heart Failure Acute Diastolic Congestive Heart Failure Acute on Chronic Diastolic Congestive Heart Failure Other Condition________________________________________ Cannot Clinically Determine   Thank you,  Carrolyn Meiers, RN [email or web address] [phone]

## 2014-12-26 NOTE — Plan of Care (Signed)
Problem: Discharge Progression Outcomes Goal: Other Discharge Outcomes/Goals Outcome: Progressing Plan of Care Progress to Goal:  Pt's Hgb today was 8.3.  Dr wanted her to receive 1 unit of PRBC before going home.  WBC stable at .8.  Pt had no c/o pain today.  Still has poor PO intake. Dr is d/cing her home w/home health and home PT.  Removed IVs, reviewed d/c instructions.  Pt will go home w/husband.

## 2014-12-26 NOTE — Discharge Instructions (Signed)
Acute Myeloid Leukemia Acute myeloid leukemia (AML) is a rapid growth cancer of the blood and the soft tissue inside your bones (bone marrow). Normally, your bone marrow makes blast cells that develop into important white blood cells called myeloid cells (and several other types of mature blood cells). These mature cells help to fight infection, carry oxygen, and stop bleeding. With AML, the bone marrow makes abnormal, or unformed myeloid blast cells. These abnormal cells develop into leukemia cells and occupy space in the blood where healthy cells need room. The rapidly growing leukemia cells begin to take over. Leukemia cells do not fight infection or carry out other important jobs in the blood, and symptoms of infection and illness appear. There are several types of AML depending on the stage and characteristics of the leukemia cells. CAUSES  Experts are not clear on what causes the bone marrow to produce abnormal cells that lead to leukemia. For the most part, it does not appear to be genetic, but related to other external factors.  RISK FACTORS Risk factors include:  Age 35 years and older.  Female.  Smoking.  History of chemotherapy or radiation therapy.  Exposure to chemicals.  Other blood disorders.  Genetic disorders, such as Down syndrome. SYMPTOMS   Poor appetite.  Tiring easily.  Weakness.  Shortness of breath.  Repeat infections.  Low-grade fevers.  Bone pain or aches.  Joint pain or aches.  Abdominal pain.  Pale skin.  Bruising.  Nosebleeds and easy bleeding from minor cuts.  Slow healing of cuts.  Spots on the skin.  Swollen glands.  Headache.  Weight loss.  Swollen gums.  Lumps under the skin. DIAGNOSIS  The diagnosis of AML is made by tests such as:  Blood tests to check blood cell counts and the shape of the blood cells.  Sampling parts of bone that make blood cells (bone marrow).  Genetic testing.  Sampling spinal fluid for leukemia  cells.  A biopsy of lumps to check for leukemia cells.  X-ray exams, ultrasonography, or CT scans. TREATMENT  The type of AML diagnosed will guide treatment options. Treatment can last for several months up to 2-3 years. Treatment aims to destroy leukemia cells as well as stop new diseased cells from growing. Treatment may include:  Chemotherapy.  Radiation therapy to kill cancer cells.  Targeted medicines to treat specific chromosomal mutations.  Stem cell transplant to replace diseased bone marrow with healthy donor bone marrow.  Experimental treatments through clinical trials.  In rare cases, surgery. HOME CARE INSTRUCTIONS  When you are on chemotherapy:  You and and any visitors should wash hands often. Wash hands before meals, after being outside, and after using the toilet.  Keep your teeth and gums clean and well cared for. Use a soft toothbrush.  Talk with your health care provider about the safety of immunizations.  When visiting a health care facility, ask about side entrances or waiting areas where you will not be exposed to infections.  Take medicines only as directed by your health care provider.  Use a good sun block and clothing to prevent sun exposure.  Usually, it is recommended that other family members receive an influenza shot every year. SEEK MEDICAL CARE IF:   You have a cough or cold symptoms.  You have a sore throat.  You have painful urination.  You have frequent diarrhea.  You have frequent vomiting.  You have a skin rash.  You have a fever.  You have chills.  You have been exposed to chickenpox or measles, especially if you have not been immunized or are not immune to these illnesses. SEEK IMMEDIATE MEDICAL CARE IF:   You have trouble breathing.  You have blood in your urine or feces (stools). Document Released: 03/15/2013 Document Revised: 10/09/2013 Document Reviewed: 03/15/2013 [facility] Patient Information 2015 [place], [place].  This information is not intended to replace advice given to you by your health care provider. Make sure you discuss any questions you have with your health care provider.

## 2014-12-26 NOTE — Discharge Summary (Signed)
Physician Discharge Summary  Patient ID: [name] MRN: [id number] DOB/AGE: 10-Aug-1935 79 y.o.  Admit date: 12/21/2014 Discharge date: 12/26/2014  Admission Diagnoses:  Discharge Diagnoses:  Principal Problem:   Antineoplastic chemotherapy induced pancytopenia Active Problems:   Atrial fibrillation   Mechanical heart valve present   Coronary artery disease   Essential hypertension   AML (acute myeloblastic leukemia)   Weakness   Malnutrition of moderate degree   Chronic diastolic congestive heart failure AODM  Discharged Condition: stable  Hospital Course: admitted with progressive weakness, with pancytopenia in patient with recent chemotherapy.  Supportive care and blood transfusions given, with improvement. No fever, chills, or signs of infection noted.  Slow progression with PT but gradually improved  Pt continued on lovenox for valvular heart disease; not felt to be candidate for oral anticoagulation for this due to very high risk of bleeding.  Heart failure stable; BP low, and BP meds were reduced, with improvement.  Glucose low, and DM medications were held.  Evaluated by cardiology. Echo showed preserved LV fxn without change in mechanical mitral valve  PT ambulated pt; had episode of leg weakness/shaking, but this appeared to be bilateral and exertionally induced.  No further events noted.  She was transfused an additional unit of blood for hgb 8.3, as she has had significant symptoms with hgb below 8.5 in the past.  Discussed chemotherapy continuation with pt and husband, as did Dr. Grayland Ormond.  At this point she wishes to continue trying to take chemotherapy.  She will f/u with [place] on Monday to determine if she's able to proceed.  She will f/u with [place] in about 1 week.  Advance diet as able to regular diet and encouraged supplements; follow FSBS at home.  Return to office in 1 week to assess BP and glucose.  HH PT and RN arranged  Discharge Exam: Blood  pressure 149/51, pulse 61, temperature 98.4 F (36.9 C), temperature source Oral, resp. rate 20, height 5\' 2"  (1.575 m), weight 51.483 kg (113 lb 8 oz), SpO2 96 %.   Disposition: [facility] with [facility]      Discharge Instructions    Type and screen    Complete by:  Dec 22, 2014      Care order/instruction    Complete by:  As directed   Transfuse Parameters     Complete patient signature process for consent form    Complete by:  As directed      Diet - low sodium heart healthy    Complete by:  As directed      Increase activity slowly    Complete by:  As directed   Up with rolling walker as tolerated     Practitioner attestation of consent    Complete by:  As directed   I, the ordering practitioner, attest that I have discussed with the patient the benefits, risks, side effects, alternatives, likelihood of achieving goals and potential problems during recovery for the procedure listed.  Procedure:  Blood Product(s)            Medication List    STOP taking these medications        amLODipine 5 MG tablet  Commonly known as:  NORVASC     docusate sodium 100 MG capsule  Commonly known as:  COLACE     enoxaparin 60 MG/0.6ML injection  Commonly known as:  LOVENOX  Replaced by:  enoxaparin 150 MG/ML injection     furosemide 20 MG tablet  Commonly known  as:  LASIX     glipiZIDE 5 MG 24 hr tablet  Commonly known as:  GLUCOTROL XL     levofloxacin 500 MG tablet  Commonly known as:  LEVAQUIN     posaconazole 100 MG Tbec delayed-release tablet  Commonly known as:  NOXAFIL      TAKE these medications        acetaminophen 325 MG tablet  Commonly known as:  TYLENOL  Take 650 mg by mouth every 4 (four) hours as needed for mild pain or headache.     allopurinol 300 MG tablet  Commonly known as:  ZYLOPRIM  Take 300 mg by mouth daily.     brimonidine 0.15 % ophthalmic solution  Commonly known as:  ALPHAGAN  Place 1 drop into both eyes 2 (two) times daily.      carbidopa-levodopa 25-100 MG per tablet  Commonly known as:  SINEMET IR  Take 1 tablet by mouth 4 (four) times daily.     cholecalciferol 1000 UNITS tablet  Commonly known as:  VITAMIN D  Take 1,000 Units by mouth daily.     enoxaparin 150 MG/ML injection  Commonly known as:  LOVENOX  Inject 0.33 mLs (50 mg total) into the skin every 12 (twelve) hours.     ferrous sulfate 325 (65 FE) MG tablet  Take 325 mg by mouth daily with breakfast.     latanoprost 0.005 % ophthalmic solution  Commonly known as:  XALATAN  Place 1 drop into both eyes at bedtime.     levothyroxine 100 MCG tablet  Commonly known as:  SYNTHROID, LEVOTHROID  Take 100 mcg by mouth daily before breakfast.     losartan 100 MG tablet  Commonly known as:  COZAAR  Take 100 mg by mouth daily.     omeprazole 40 MG capsule  Commonly known as:  PRILOSEC  Take 40 mg by mouth 2 (two) times daily.     prochlorperazine 10 MG tablet  Commonly known as:  COMPAZINE  Take 1 tablet (10 mg total) by mouth every 6 (six) hours as needed (Nausea or vomiting).     simvastatin 20 MG tablet  Commonly known as:  ZOCOR  Take 20 mg by mouth at bedtime.     sotalol 80 MG tablet  Commonly known as:  BETAPACE  Take 80 mg by mouth 2 (two) times daily.     valACYclovir 500 MG tablet  Commonly known as:  VALTREX     vitamin B-12 250 MCG tablet  Commonly known as:  CYANOCOBALAMIN  Take 250 mcg by mouth daily.       Follow-up Information    Follow up with BERT Briscoe Burns III, MD In 1 week.   Specialty:  Internal Medicine   Contact information:   [place] [address] [phone]       Follow up with Lloyd Huger, MD In 5 days.   Specialty:  Oncology   Contact information:   [place] [address] [phone]       Signed: Tama High III 12/26/2014, 12:46 PM

## 2014-12-26 NOTE — Care Management Important Message (Signed)
Important Message  Patient Details  Name: NAKIEA METZNER MRN: [id number] Date of Birth: [birth year]   Medicare Important Message Given:  Yes-third notification given    [name] 12/26/2014, 9:25 AM

## 2014-12-27 LAB — TYPE AND SCREEN
ABO/RH(D): O POS
Antibody Screen: NEGATIVE
Unit division: 0

## 2014-12-30 ENCOUNTER — Encounter: Payer: Self-pay | Admitting: Emergency Medicine

## 2014-12-30 ENCOUNTER — Emergency Department: Payer: Medicare Other

## 2014-12-30 ENCOUNTER — Inpatient Hospital Stay
Admission: EM | Admit: 2014-12-30 | Discharge: 2015-01-03 | DRG: 291 | Disposition: A | Discharge status: Skilled Nursing Facility | Payer: Medicare Other | Attending: Internal Medicine | Admitting: Internal Medicine

## 2014-12-30 DIAGNOSIS — I248 Other forms of acute ischemic heart disease: Secondary | ICD-10-CM | POA: Diagnosis present

## 2014-12-30 DIAGNOSIS — I10 Essential (primary) hypertension: Secondary | ICD-10-CM | POA: Diagnosis present

## 2014-12-30 DIAGNOSIS — R531 Weakness: Secondary | ICD-10-CM

## 2014-12-30 DIAGNOSIS — Z9049 Acquired absence of other specified parts of digestive tract: Secondary | ICD-10-CM | POA: Diagnosis present

## 2014-12-30 DIAGNOSIS — R0600 Dyspnea, unspecified: Secondary | ICD-10-CM

## 2014-12-30 DIAGNOSIS — G20 Parkinson's disease: Secondary | ICD-10-CM | POA: Diagnosis present

## 2014-12-30 DIAGNOSIS — G629 Polyneuropathy, unspecified: Secondary | ICD-10-CM | POA: Diagnosis present

## 2014-12-30 DIAGNOSIS — Z66 Do not resuscitate: Secondary | ICD-10-CM | POA: Diagnosis present

## 2014-12-30 DIAGNOSIS — J811 Chronic pulmonary edema: Secondary | ICD-10-CM | POA: Diagnosis present

## 2014-12-30 DIAGNOSIS — E780 Pure hypercholesterolemia: Secondary | ICD-10-CM | POA: Diagnosis present

## 2014-12-30 DIAGNOSIS — Z8249 Family history of ischemic heart disease and other diseases of the circulatory system: Secondary | ICD-10-CM

## 2014-12-30 DIAGNOSIS — I951 Orthostatic hypotension: Secondary | ICD-10-CM | POA: Diagnosis present

## 2014-12-30 DIAGNOSIS — Z9109 Other allergy status, other than to drugs and biological substances: Secondary | ICD-10-CM

## 2014-12-30 DIAGNOSIS — Z87891 Personal history of nicotine dependence: Secondary | ICD-10-CM

## 2014-12-30 DIAGNOSIS — E43 Unspecified severe protein-calorie malnutrition: Secondary | ICD-10-CM | POA: Diagnosis present

## 2014-12-30 DIAGNOSIS — K219 Gastro-esophageal reflux disease without esophagitis: Secondary | ICD-10-CM | POA: Diagnosis present

## 2014-12-30 DIAGNOSIS — Z952 Presence of prosthetic heart valve: Secondary | ICD-10-CM

## 2014-12-30 DIAGNOSIS — I4891 Unspecified atrial fibrillation: Secondary | ICD-10-CM | POA: Diagnosis present

## 2014-12-30 DIAGNOSIS — E039 Hypothyroidism, unspecified: Secondary | ICD-10-CM | POA: Diagnosis present

## 2014-12-30 DIAGNOSIS — Z950 Presence of cardiac pacemaker: Secondary | ICD-10-CM

## 2014-12-30 DIAGNOSIS — I5032 Chronic diastolic (congestive) heart failure: Secondary | ICD-10-CM | POA: Diagnosis present

## 2014-12-30 DIAGNOSIS — C9200 Acute myeloblastic leukemia, not having achieved remission: Secondary | ICD-10-CM | POA: Diagnosis present

## 2014-12-30 DIAGNOSIS — I25810 Atherosclerosis of coronary artery bypass graft(s) without angina pectoris: Secondary | ICD-10-CM | POA: Diagnosis present

## 2014-12-30 DIAGNOSIS — I2510 Atherosclerotic heart disease of native coronary artery without angina pectoris: Secondary | ICD-10-CM | POA: Diagnosis present

## 2014-12-30 DIAGNOSIS — E11649 Type 2 diabetes mellitus with hypoglycemia without coma: Secondary | ICD-10-CM | POA: Diagnosis present

## 2014-12-30 DIAGNOSIS — M5136 Other intervertebral disc degeneration, lumbar region: Secondary | ICD-10-CM | POA: Diagnosis present

## 2014-12-30 DIAGNOSIS — D61810 Antineoplastic chemotherapy induced pancytopenia: Secondary | ICD-10-CM | POA: Diagnosis present

## 2014-12-30 DIAGNOSIS — E440 Moderate protein-calorie malnutrition: Secondary | ICD-10-CM | POA: Diagnosis present

## 2014-12-30 DIAGNOSIS — Z90710 Acquired absence of both cervix and uterus: Secondary | ICD-10-CM | POA: Diagnosis not present

## 2014-12-30 DIAGNOSIS — T451X5A Adverse effect of antineoplastic and immunosuppressive drugs, initial encounter: Secondary | ICD-10-CM | POA: Diagnosis present

## 2014-12-30 DIAGNOSIS — K5900 Constipation, unspecified: Secondary | ICD-10-CM | POA: Diagnosis present

## 2014-12-30 DIAGNOSIS — H409 Unspecified glaucoma: Secondary | ICD-10-CM | POA: Diagnosis present

## 2014-12-30 LAB — CBC WITH DIFFERENTIAL/PLATELET
BASOS PCT: 1 %
Basophils Absolute: 0 10*3/uL (ref 0–0.1)
EOS ABS: 0.3 10*3/uL (ref 0–0.7)
EOS PCT: 16 %
HEMATOCRIT: 32.9 % — AB (ref 35.0–47.0)
Hemoglobin: 11.1 g/dL — ABNORMAL LOW (ref 12.0–16.0)
Lymphocytes Relative: 37 %
Lymphs Abs: 0.6 10*3/uL — ABNORMAL LOW (ref 1.0–3.6)
MCH: 29.4 pg (ref 26.0–34.0)
MCHC: 33.7 g/dL (ref 32.0–36.0)
MCV: 87.1 fL (ref 80.0–100.0)
MONOS PCT: 2 %
Monocytes Absolute: 0 10*3/uL — ABNORMAL LOW (ref 0.2–0.9)
Neutro Abs: 0.7 10*3/uL — ABNORMAL LOW (ref 1.4–6.5)
Neutrophils Relative %: 44 %
Platelets: 176 10*3/uL (ref 150–440)
RBC: 3.77 MIL/uL — ABNORMAL LOW (ref 3.80–5.20)
RDW: 17.9 % — ABNORMAL HIGH (ref 11.5–14.5)
WBC: 1.7 10*3/uL — AB (ref 3.6–11.0)

## 2014-12-30 LAB — COMPREHENSIVE METABOLIC PANEL
ALT: 7 U/L — AB (ref 14–54)
AST: 16 U/L (ref 15–41)
Albumin: 3.4 g/dL — ABNORMAL LOW (ref 3.5–5.0)
Alkaline Phosphatase: 67 U/L (ref 38–126)
Anion gap: 8 (ref 5–15)
BUN: 9 mg/dL (ref 6–20)
CALCIUM: 10 mg/dL (ref 8.9–10.3)
CO2: 26 mmol/L (ref 22–32)
Chloride: 103 mmol/L (ref 101–111)
Creatinine, Ser: 0.91 mg/dL (ref 0.44–1.00)
GFR calc non Af Amer: 59 mL/min — ABNORMAL LOW (ref >60)
Glucose, Bld: 92 mg/dL (ref 65–99)
POTASSIUM: 4.5 mmol/L (ref 3.5–5.1)
Sodium: 137 mmol/L (ref 135–145)
Total Bilirubin: 0.7 mg/dL (ref 0.3–1.2)
Total Protein: 6.6 g/dL (ref 6.5–8.1)

## 2014-12-30 LAB — TROPONIN I: Troponin I: <0.03 ng/mL (ref <0.031)

## 2014-12-30 LAB — GLUCOSE, CAPILLARY
GLUCOSE-CAPILLARY: 115 mg/dL — AB (ref 65–99)
GLUCOSE-CAPILLARY: 134 mg/dL — AB (ref 65–99)

## 2014-12-30 LAB — LACTIC ACID, PLASMA
LACTIC ACID, VENOUS: 0.9 mmol/L (ref 0.5–2.0)
Lactic Acid, Venous: 1 mmol/L (ref 0.5–2.0)

## 2014-12-30 LAB — URINALYSIS COMPLETE WITH MICROSCOPIC (ARMC ONLY)
BILIRUBIN URINE: NEGATIVE
Glucose, UA: NEGATIVE mg/dL
Hgb urine dipstick: NEGATIVE
KETONES UR: NEGATIVE mg/dL
Leukocytes, UA: NEGATIVE
Nitrite: NEGATIVE
Protein, ur: NEGATIVE mg/dL
SPECIFIC GRAVITY, URINE: 1.01 (ref 1.005–1.030)
pH: 7 (ref 5.0–8.0)

## 2014-12-30 LAB — TSH: TSH: 4.196 u[IU]/mL (ref 0.350–4.500)

## 2014-12-30 LAB — BRAIN NATRIURETIC PEPTIDE: B Natriuretic Peptide: 327 pg/mL — ABNORMAL HIGH (ref 0.0–100.0)

## 2014-12-30 MED ORDER — BRIMONIDINE TARTRATE 0.15 % OP SOLN
1.0000 [drp] | Freq: Two times a day (BID) | OPHTHALMIC | Status: DC
  Administered 2014-12-30 – 2015-01-03 (×8): 1 [drp] via OPHTHALMIC
  Filled 2014-12-30: qty 5

## 2014-12-30 MED ORDER — LATANOPROST 0.005 % OP SOLN
1.0000 [drp] | Freq: Every day | OPHTHALMIC | Status: DC
  Administered 2014-12-30 – 2015-01-02 (×4): 1 [drp] via OPHTHALMIC
  Filled 2014-12-30: qty 2.5

## 2014-12-30 MED ORDER — SODIUM CHLORIDE 0.9 % IJ SOLN
3.0000 mL | Freq: Two times a day (BID) | INTRAMUSCULAR | Status: DC
  Administered 2014-12-30 – 2015-01-03 (×7): 3 mL via INTRAVENOUS

## 2014-12-30 MED ORDER — PANTOPRAZOLE SODIUM 40 MG PO TBEC
40.0000 mg | DELAYED_RELEASE_TABLET | Freq: Every day | ORAL | Status: DC
  Administered 2014-12-30 – 2015-01-03 (×5): 40 mg via ORAL
  Filled 2014-12-30 (×5): qty 1

## 2014-12-30 MED ORDER — ACETAMINOPHEN 325 MG PO TABS
650.0000 mg | ORAL_TABLET | ORAL | Status: DC | PRN
  Administered 2014-12-31: 650 mg via ORAL
  Filled 2014-12-30: qty 2

## 2014-12-30 MED ORDER — VITAMIN D 1000 UNITS PO TABS
1000.0000 [IU] | ORAL_TABLET | Freq: Every day | ORAL | Status: DC
  Administered 2014-12-30 – 2015-01-03 (×5): 1000 [IU] via ORAL
  Filled 2014-12-30 (×5): qty 1

## 2014-12-30 MED ORDER — CARBIDOPA-LEVODOPA 25-100 MG PO TABS
1.0000 | ORAL_TABLET | Freq: Four times a day (QID) | ORAL | Status: DC
  Administered 2014-12-30 – 2015-01-03 (×16): 1 via ORAL
  Filled 2014-12-30 (×16): qty 1

## 2014-12-30 MED ORDER — LEVOTHYROXINE SODIUM 100 MCG PO TABS
100.0000 ug | ORAL_TABLET | Freq: Every day | ORAL | Status: DC
  Administered 2014-12-31 – 2015-01-01 (×2): 100 ug via ORAL
  Filled 2014-12-30 (×2): qty 1

## 2014-12-30 MED ORDER — FUROSEMIDE 10 MG/ML IJ SOLN
40.0000 mg | Freq: Once | INTRAMUSCULAR | Status: AC
  Administered 2014-12-30: 40 mg via INTRAVENOUS
  Filled 2014-12-30: qty 4

## 2014-12-30 MED ORDER — VALACYCLOVIR HCL 500 MG PO TABS
500.0000 mg | ORAL_TABLET | ORAL | Status: DC
  Administered 2014-12-31 – 2015-01-03 (×4): 500 mg via ORAL
  Filled 2014-12-30 (×4): qty 1

## 2014-12-30 MED ORDER — SODIUM CHLORIDE 0.9 % IV SOLN: 250.0000 mL | INTRAVENOUS | Status: DC | PRN

## 2014-12-30 MED ORDER — CYANOCOBALAMIN 500 MCG PO TABS
250.0000 ug | ORAL_TABLET | Freq: Every day | ORAL | Status: DC
  Administered 2014-12-30 – 2015-01-03 (×5): 250 ug via ORAL
  Filled 2014-12-30 (×5): qty 1

## 2014-12-30 MED ORDER — FUROSEMIDE 20 MG PO TABS
20.0000 mg | ORAL_TABLET | Freq: Every day | ORAL | Status: DC
  Administered 2014-12-31 – 2015-01-02 (×3): 20 mg via ORAL
  Filled 2014-12-30 (×4): qty 1

## 2014-12-30 MED ORDER — LEVOFLOXACIN 500 MG PO TABS: 500.0000 mg | ORAL_TABLET | Freq: Every day | ORAL | Status: DC

## 2014-12-30 MED ORDER — ENOXAPARIN SODIUM 60 MG/0.6ML ~~LOC~~ SOLN
50.0000 mg | Freq: Two times a day (BID) | SUBCUTANEOUS | Status: DC
  Administered 2014-12-30 – 2015-01-03 (×8): 50 mg via SUBCUTANEOUS
  Filled 2014-12-30 (×8): qty 0.6

## 2014-12-30 MED ORDER — PROCHLORPERAZINE MALEATE 10 MG PO TABS
10.0000 mg | ORAL_TABLET | Freq: Four times a day (QID) | ORAL | Status: DC | PRN
Start: 2014-12-30 — End: 2015-01-03
  Administered 2015-01-01: 10 mg via ORAL
  Filled 2014-12-30 (×2): qty 1

## 2014-12-30 MED ORDER — SODIUM CHLORIDE 0.9 % IJ SOLN: 3.0000 mL | INTRAMUSCULAR | Status: DC | PRN

## 2014-12-30 MED ORDER — LOSARTAN POTASSIUM 50 MG PO TABS
100.0000 mg | ORAL_TABLET | Freq: Every day | ORAL | Status: DC
  Administered 2014-12-30 – 2015-01-01 (×3): 100 mg via ORAL
  Filled 2014-12-30 (×3): qty 2

## 2014-12-30 MED ORDER — SIMVASTATIN 20 MG PO TABS
20.0000 mg | ORAL_TABLET | Freq: Every day | ORAL | Status: DC
  Administered 2014-12-30 – 2014-12-31 (×2): 20 mg via ORAL
  Filled 2014-12-30 (×2): qty 1

## 2014-12-30 MED ORDER — GLIPIZIDE ER 5 MG PO TB24
5.0000 mg | ORAL_TABLET | Freq: Every day | ORAL | Status: DC
  Administered 2014-12-31: 5 mg via ORAL
  Filled 2014-12-30 (×2): qty 1

## 2014-12-30 MED ORDER — LEVOFLOXACIN 500 MG PO TABS
500.0000 mg | ORAL_TABLET | Freq: Once | ORAL | Status: AC
  Administered 2014-12-30: 500 mg via ORAL
  Filled 2014-12-30: qty 1

## 2014-12-30 MED ORDER — LEVOFLOXACIN 250 MG PO TABS
250.0000 mg | ORAL_TABLET | ORAL | Status: DC
  Administered 2014-12-31 – 2015-01-02 (×3): 250 mg via ORAL
  Filled 2014-12-30 (×3): qty 1

## 2014-12-30 MED ORDER — FERROUS SULFATE 325 (65 FE) MG PO TABS
325.0000 mg | ORAL_TABLET | Freq: Every day | ORAL | Status: DC
  Administered 2014-12-31 – 2015-01-03 (×4): 325 mg via ORAL
  Filled 2014-12-30 (×4): qty 1

## 2014-12-30 MED ORDER — INSULIN ASPART 100 UNIT/ML ~~LOC~~ SOLN
0.0000 [IU] | Freq: Three times a day (TID) | SUBCUTANEOUS | Status: DC
  Administered 2014-12-31: 2 [IU] via SUBCUTANEOUS
  Administered 2015-01-01 – 2015-01-02 (×2): 3 [IU] via SUBCUTANEOUS
  Administered 2015-01-02: 18:00:00 2 [IU] via SUBCUTANEOUS
  Administered 2015-01-03: 3 [IU] via SUBCUTANEOUS
  Filled 2014-12-30 (×3): qty 3
  Filled 2014-12-30 (×2): qty 2

## 2014-12-30 MED ORDER — INSULIN ASPART 100 UNIT/ML ~~LOC~~ SOLN: 0.0000 [IU] | Freq: Every day | SUBCUTANEOUS | Status: DC

## 2014-12-30 NOTE — ED Notes (Signed)
Report called to [facility] for room 247

## 2014-12-30 NOTE — ED Notes (Signed)
Dr Cinda Quest at bedside for an up date

## 2014-12-30 NOTE — ED Notes (Signed)
Pt could not stand long enough to finish orthostatic VS

## 2014-12-30 NOTE — ED Notes (Signed)
Unable to obtain purple top, lab called, pt sat up with [place] sandwich tray, no distress noted at this time, cont to monitor

## 2014-12-30 NOTE — ED Notes (Signed)
Pt states that she has been growing increasingly weaker over the past couple weeks, pt recently diagnosed with leukemia and ems reports pt was scheduled to have her first chemo last Monday and was unable due to coming to hospital with weakness then also.

## 2014-12-30 NOTE — Progress Notes (Signed)
Patient admitted to unit from ED , patient alert and oriented, denies pain or discomfort, VSS, family at bedside, mood calm, A fib on the monitor, no respiratory distress noted, will continue to monitor.

## 2014-12-30 NOTE — ED Notes (Signed)
Pt assisted on bedpan, urine sample obtained and sent

## 2014-12-30 NOTE — ED Notes (Signed)
Pt arrives via ems from home, pt has been having chemo treatments since feb for a diagnosis of leukemia, pt is c/o weakness that has worsened over the past couple weeks, pt's husband states that she usually can use the walker but for the past 2weeks she hasn't been able to do that, her legs become very shakey and appear like she is going to fall according to the husband, he also states that she has a bedside commode that was very diff this am for her to use and he had to pick her up and put her back to bed. Pt is axox4, no distress noted at this time

## 2014-12-30 NOTE — Care Management Note (Signed)
Case Management Note  Patient Details  Name: KELIE GAINEY MRN: [id number] Date of Birth: 12-29-1935  Subjective/Objective:                    Action/Plan:   Expected Discharge Date:                  Expected Discharge Plan:     In-House Referral:     Discharge planning Services     Post Acute Care Choice:    Choice offered to:     DME Arranged:    DME Agency:     HH Arranged:    [place] Agency:     Status of Service:     Medicare Important Message Given:    Date Medicare IM Given:    Medicare IM give by:    Date Additional Medicare IM Given:    Additional Medicare Important Message give by:     If discussed at [place] of Stay Meetings, dates discussed:    Additional Comments: Current services with [facility].  Anticipate need for  rehab at discharge  Ival Bible, RN 12/30/2014, 5:04 PM

## 2014-12-30 NOTE — ED Provider Notes (Signed)
[facility] Emergency Department Provider Note  ____________________________________________  Time seen: Approximately 12:21 PM  I have reviewed the triage vital signs and the nursing notes.   HISTORY  Chief Complaint Fatigue    HPI [name] is a 79 y.o. female who continues to have progressive weakness patient is getting chemotherapy for leukemia. She has been too weak to get the last chemotherapy given. Patient was in the hospital on the 15th of this month because of weakness. He was found to be anemic and neutropenic. Patient's husband has gotten her bedside commode because she is very weak when she tries to walk to the bathroom however today he was at bedside commode at the bedside she was too weak to get off of it herself. He barely got her back into bed. He reports that when she tries to walk she gets very weak and her legs seem to get out and she she gets very shaky and he has to catch her at these times she becomes very tachycardic he says.    Past Medical History  Diagnosis Date  . Atrial fibrillation   . Parkinson's disease   . Glaucoma   . Hypertension   . Hypothyroidism   . GERD (gastroesophageal reflux disease)   . High cholesterol   . Diabetes mellitus without complication   . CAD (coronary artery disease) of bypass graft   . History of appendectomy   . H/O: hysterectomy   . Leukemia     Patient Active Problem List   Diagnosis Date Noted  . Chronic diastolic congestive heart failure 12/26/2014  . Malnutrition of moderate degree 12/23/2014  . Weakness 12/21/2014  . AML (acute myeloblastic leukemia)   . Atrial fibrillation 10/31/2014  . Mechanical heart valve present 10/31/2014  . Coronary artery disease 10/31/2014  . Essential hypertension 10/31/2014  . Antineoplastic chemotherapy induced pancytopenia 10/31/2014    Past Surgical History  Procedure Laterality Date  . Cardiac valve replacement    . Pacemaker insertion       Current Outpatient Rx  Name  Route  Sig  Dispense  Refill  . acetaminophen (TYLENOL) 325 MG tablet   Oral   Take 650 mg by mouth every 4 (four) hours as needed for mild pain or headache.         . allopurinol (ZYLOPRIM) 300 MG tablet   Oral   Take 300 mg by mouth daily.         . brimonidine (ALPHAGAN) 0.15 % ophthalmic solution   Both Eyes   Place 1 drop into both eyes 2 (two) times daily.         . carbidopa-levodopa (SINEMET IR) 25-100 MG per tablet   Oral   Take 1 tablet by mouth 4 (four) times daily.   120 tablet   11   . cholecalciferol (VITAMIN D) 1000 UNITS tablet   Oral   Take 1,000 Units by mouth daily.         Marland Kitchen enoxaparin (LOVENOX) 150 MG/ML injection   Subcutaneous   Inject 0.33 mLs (50 mg total) into the skin every 12 (twelve) hours.   60 Syringe   11   . ferrous sulfate 325 (65 FE) MG tablet   Oral   Take 325 mg by mouth daily with breakfast.         . latanoprost (XALATAN) 0.005 % ophthalmic solution   Both Eyes   Place 1 drop into both eyes at bedtime.         Marland Kitchen  levothyroxine (SYNTHROID, LEVOTHROID) 100 MCG tablet   Oral   Take 100 mcg by mouth daily before breakfast.         . losartan (COZAAR) 100 MG tablet   Oral   Take 100 mg by mouth daily.         Marland Kitchen omeprazole (PRILOSEC) 40 MG capsule   Oral   Take 40 mg by mouth 2 (two) times daily.         . prochlorperazine (COMPAZINE) 10 MG tablet   Oral   Take 1 tablet (10 mg total) by mouth every 6 (six) hours as needed (Nausea or vomiting).   30 tablet   1   . simvastatin (ZOCOR) 20 MG tablet   Oral   Take 20 mg by mouth at bedtime.         . sotalol (BETAPACE) 80 MG tablet   Oral   Take 80 mg by mouth 2 (two) times daily.         . valACYclovir (VALTREX) 500 MG tablet               . vitamin B-12 (CYANOCOBALAMIN) 250 MCG tablet   Oral   Take 250 mcg by mouth daily.           Allergies Hydrochlorothiazide; Lisinopril; and Losartan  Family History   Problem Relation Age of Onset  . CAD Other   . Heart disease Father     Social History History  Substance Use Topics  . Smoking status: Former Smoker -- 1.00 packs/day for 10 years    Types: Cigarettes  . Smokeless tobacco: Not on file  . Alcohol Use: No    Review of Systems Constitutional: No fever/chills Eyes: No visual changes. ENT: No sore throat. Cardiovascular: Denies chest pain. Respiratory: Denies shortness of breath. Gastrointestinal: No abdominal pain.  No nausea, no vomiting.  No diarrhea.  No constipation. Genitourinary: Negative for dysuria. Musculoskeletal: Negative for back pain. Skin: Negative for rash. Neurological: Negative for headaches, focal weakness or numbness.  10-point ROS otherwise negative.  ____________________________________________   PHYSICAL EXAM:  VITAL SIGNS: ED Triage Vitals  Enc Vitals Group     BP 12/30/14 1205 183/76 mmHg     Pulse Rate 12/30/14 1205 78     Resp 12/30/14 1205 16     Temp 12/30/14 1205 98.2 F (36.8 C)     Temp Source 12/30/14 1205 Oral     SpO2 12/30/14 1205 97 %     Weight 12/30/14 1205 108 lb 3.9 oz (49.1 kg)     Height 12/30/14 1205 5\' 2"  (1.575 m)     Head Cir --      Peak Flow --      Pain Score --      Pain Loc --      Pain Edu? --      Excl. in [place]? --     Constitutional: Alert and oriented. Chronically ill appearing and in no acute distress. Eyes: Conjunctivae are normal. PERRL. EOMI. Head: Atraumatic. Nose: No congestion/rhinnorhea. Mouth/Throat: Mucous membranes are moist.  Oropharynx non-erythematous. Neck: No stridor. Cardiovascular: Normal rate, regular rhythm. Grossly normal heart sounds.  Good peripheral circulation. Respiratory: Normal respiratory effort.  No retractions. Lungs CTAB. Gastrointestinal: Soft and nontender. No distention. No abdominal bruits. No CVA tenderness. Patient is getting Lovenox shots in her belly. Patient also reports when she gets chemotherapy she gets that in  shots in her belly as well. }Musculoskeletal: No lower extremity tenderness nor edema.  No joint effusions. Neurologic:  Normal speech and language. No gross focal neurologic deficits are appreciated. No gait instability. Skin:  Skin is warm, dry and intact. No rash noted. Psychiatric: Mood and affect are normal. Speech and behavior are normal.  ____________________________________________   LABS (all labs ordered are listed, but only abnormal results are displayed)  Labs Reviewed  COMPREHENSIVE METABOLIC PANEL - Abnormal; Notable for the following:    Albumin 3.4 (*)    ALT 7 (*)    GFR calc non Af Amer 59 (*)    All other components within normal limits  CBC WITH DIFFERENTIAL/PLATELET - Abnormal; Notable for the following:    WBC 1.7 (*)    RBC 3.77 (*)    Hemoglobin 11.1 (*)    HCT 32.9 (*)    RDW 17.9 (*)    Neutro Abs 0.7 (*)    Lymphs Abs 0.6 (*)    Monocytes Absolute 0.0 (*)    All other components within normal limits  TROPONIN I  LACTIC ACID, PLASMA  TSH  LACTIC ACID, PLASMA  URINALYSIS COMPLETEWITH MICROSCOPIC ([facility] ONLY)  BRAIN NATRIURETIC PEPTIDE   ____________________________________________  EKG  EKG is read and interpreted by me. There appeared to be very faint pacer spikes barely visible on the EKG. Patient reports she does have a pacemaker check. Her chest. Her flipped T's in the form 5. No acute changes however. Flipped T waves appear to be better than EKG from 21st of this month ____________________________________________  RADIOLOGY  Congestive heart failure radiologist feels this is unchanged from prior ____________________________________________   PROCEDURES    ____________________________________________   INITIAL IMPRESSION / ASSESSMENT AND PLAN / ED COURSE  Pertinent labs & imaging results that were available during my care of the patient were reviewed by me and considered in my medical decision making (see chart for  details).  While getting on the bedpan patient becomes acutely dyspneic. She has to use her accessory muscles to breathe ____________________________________________   FINAL CLINICAL IMPRESSION(S) / ED DIAGNOSES  Final diagnoses:  Dyspnea  Weakness      Nena Polio, MD 12/30/14 [phone]

## 2014-12-30 NOTE — H&P (Signed)
[name] is an 79 y.o. female.   Chief Complaint: Weakness HPI: Recently discharged after admission for weakness and pancytopenia. Received blood transfusion then. Since discharge has not been seen by PT yet. Weakness has gotten worse with failure to ambulate. Mild pulm edema on cxr.   Past Medical History  Diagnosis Date  . Atrial fibrillation   . Parkinson's disease   . Glaucoma   . Hypertension   . Hypothyroidism   . GERD (gastroesophageal reflux disease)   . High cholesterol   . Diabetes mellitus without complication   . CAD (coronary artery disease) of bypass graft   . History of appendectomy   . H/O: hysterectomy   . Leukemia     Past Surgical History  Procedure Laterality Date  . Cardiac valve replacement    . Pacemaker insertion      Family History  Problem Relation Age of Onset  . CAD Other   . Heart disease Father    Social History:  reports that she has quit smoking. Her smoking use included Cigarettes. She has a 10 pack-year smoking history. She does not have any smokeless tobacco history on file. She reports that she does not drink alcohol or use illicit drugs.  Allergies:  Allergies  Allergen Reactions  . Hydrochlorothiazide Other (See Comments)    Worsening hypercalcemia Worsening hypercalcemia Reaction:  Unknown   . Lisinopril Cough and Other (See Comments)    Worsening renal insufficiency at 31m dose Other reaction(s): Other (See Comments) Worsening renal insufficiency at 457mdose  . Losartan Other (See Comments)    Worsened renal insufficiency Other reaction(s): Other (See Comments) Worsened renal insufficiency Reaction:  Unknown      (Not in a hospital admission)  Results for orders placed or performed during the hospital encounter of 12/30/14 (from the past 48 hour(s))  Comprehensive metabolic panel     Status: Abnormal   Collection Time: 12/30/14 12:27 PM  Result Value Ref Range   Sodium 137 135 - 145 mmol/L   Potassium 4.5 3.5  - 5.1 mmol/L    Comment: HEMOLYSIS AT THIS LEVEL MAY AFFECT RESULT   Chloride 103 101 - 111 mmol/L   CO2 26 22 - 32 mmol/L   Glucose, Bld 92 65 - 99 mg/dL   BUN 9 6 - 20 mg/dL   Creatinine, Ser 0.91 0.44 - 1.00 mg/dL   Calcium 10.0 8.9 - 10.3 mg/dL   Total Protein 6.6 6.5 - 8.1 g/dL   Albumin 3.4 (L) 3.5 - 5.0 g/dL   AST 16 15 - 41 U/L    Comment: HEMOLYSIS AT THIS LEVEL MAY AFFECT RESULT   ALT 7 (L) 14 - 54 U/L    Comment: HEMOLYSIS AT THIS LEVEL MAY AFFECT RESULT   Alkaline Phosphatase 67 38 - 126 U/L   Total Bilirubin 0.7 0.3 - 1.2 mg/dL    Comment: HEMOLYSIS AT THIS LEVEL MAY AFFECT RESULT   GFR calc non Af Amer 59 (L) >60 mL/min   GFR calc Af Amer >60 >60 mL/min    Comment: (NOTE) The eGFR has been calculated using the CKD EPI equation. This calculation has not been validated in all clinical situations. eGFR's persistently <60 mL/min signify possible Chronic Kidney Disease.    Anion gap 8 5 - 15  Troponin I     Status: None   Collection Time: 12/30/14 12:27 PM  Result Value Ref Range   Troponin I <0.03 <0.031 ng/mL    Comment:  NO INDICATION OF MYOCARDIAL INJURY.   CBC with Differential     Status: Abnormal   Collection Time: 12/30/14 12:27 PM  Result Value Ref Range   WBC 1.7 (L) 3.6 - 11.0 K/uL   RBC 3.77 (L) 3.80 - 5.20 MIL/uL   Hemoglobin 11.1 (L) 12.0 - 16.0 g/dL   HCT 32.9 (L) 35.0 - 47.0 %   MCV 87.1 80.0 - 100.0 fL   MCH 29.4 26.0 - 34.0 pg   MCHC 33.7 32.0 - 36.0 g/dL   RDW 17.9 (H) 11.5 - 14.5 %   Platelets 176 150 - 440 K/uL   Neutrophils Relative % 44 %   Neutro Abs 0.7 (L) 1.4 - 6.5 K/uL   Lymphocytes Relative 37 %   Lymphs Abs 0.6 (L) 1.0 - 3.6 K/uL   Monocytes Relative 2 %   Monocytes Absolute 0.0 (L) 0.2 - 0.9 K/uL   Eosinophils Relative 16 %   Eosinophils Absolute 0.3 0 - 0.7 K/uL   Basophils Relative 1 %   Basophils Absolute 0.0 0 - 0.1 K/uL  TSH     Status: None   Collection Time: 12/30/14 12:27 PM  Result Value Ref Range   TSH  4.196 0.350 - 4.500 uIU/mL  Lactic acid, plasma     Status: None   Collection Time: 12/30/14  1:04 PM  Result Value Ref Range   Lactic Acid, Venous 0.9 0.5 - 2.0 mmol/L  Urinalysis complete, with microscopic     Status: Abnormal   Collection Time: 12/30/14  2:03 PM  Result Value Ref Range   Color, Urine YELLOW (A) YELLOW   APPearance CLEAR (A) CLEAR   Glucose, UA NEGATIVE NEGATIVE mg/dL   Bilirubin Urine NEGATIVE NEGATIVE   Ketones, ur NEGATIVE NEGATIVE mg/dL   Specific Gravity, Urine 1.010 1.005 - 1.030   Hgb urine dipstick NEGATIVE NEGATIVE   pH 7.0 5.0 - 8.0   Protein, ur NEGATIVE NEGATIVE mg/dL   Nitrite NEGATIVE NEGATIVE   Leukocytes, UA NEGATIVE NEGATIVE   RBC / HPF 0-5 0 - 5 RBC/hpf   WBC, UA 0-5 0 - 5 WBC/hpf   Bacteria, UA RARE (A) NONE SEEN   Squamous Epithelial / LPF 0-5 (A) NONE SEEN  Brain natriuretic peptide     Status: Abnormal   Collection Time: 12/30/14  2:42 PM  Result Value Ref Range   B Natriuretic Peptide 327.0 (H) 0.0 - 100.0 pg/mL   Dg Chest 1 View  12/30/2014   CLINICAL DATA:  Increasing weakness over the past couple of weeks.  EXAM: CHEST  1 VIEW  COMPARISON:  Single view of the chest 10/30/2014. PA and lateral chest 06/11/2014.  FINDINGS: Dual lead pacing device is in place in the patient is status post CABG. There is cardiomegaly and pulmonary vascular congestion, unchanged in appearance. No consolidative process, pneumothorax or effusion.  IMPRESSION: Cardiomegaly and pulmonary vascular congestion.  No acute finding.   Electronically Signed   By: Inge Rise M.D.   On: 12/30/2014 13:02    Review of Systems  Constitutional: Positive for weight loss. Negative for fever.  HENT: Negative for hearing loss and sore throat.   Eyes: Negative for blurred vision.  Respiratory: Negative for cough and shortness of breath.   Cardiovascular: Positive for leg swelling. Negative for chest pain.  Gastrointestinal: Negative for nausea, vomiting and abdominal pain.   Genitourinary: Negative for dysuria.  Musculoskeletal: Positive for joint pain.  Skin: Negative for rash.  Neurological: Positive for weakness. Negative for focal  weakness.  Endo/Heme/Allergies: Does not bruise/bleed easily.    Blood pressure 183/76, pulse 78, temperature 98.2 F (36.8 C), temperature source Oral, resp. rate 16, height 5' 2"  (1.575 m), weight 49.1 kg (108 lb 3.9 oz), SpO2 97 %. Physical Exam  Constitutional: She is oriented to person, place, and time.  Poorly nourished female in no acute distress  HENT:  Head: Normocephalic.  Mouth/Throat: Oropharynx is clear and moist. No oropharyngeal exudate.  Eyes: EOM are normal. Pupils are equal, round, and reactive to light. No scleral icterus.  Neck: Neck supple. No JVD present. No tracheal deviation present. No thyromegaly present.  Cardiovascular: Normal rate.   Murmur heard. Respiratory: Effort normal. No respiratory distress. She exhibits no tenderness.  Bibasilar crackles  GI: She exhibits no distension and no mass. There is no tenderness.  Musculoskeletal: She exhibits edema and tenderness.  Lymphadenopathy:    She has no cervical adenopathy.  Neurological: She is alert and oriented to person, place, and time. She displays normal reflexes.  Skin: Skin is warm and dry. No rash noted.     Assessment/Plan 1. Pulmonary Edema: Receive blood transfusion a few days ago and since has been on half her usual dose of lasix. Has had increased leg swelling. Likely mildly fluid overloaded. Will give 1 dose IV lasix and increase her back to her full dose.  2. Weakness: Not improving. Will have PT and case management reevaluate for possible rehab placement.  3. Pancytopenia: Hgb appears stable since last transfusion.  4. Parkinsons: C/O increased skaking but feel more related to weakness at this piont.  5. Leukemia: Will consult Dr Grayland Ormond about further chemo at this point.  6. Code StatusL DNR  T= 91mn  JBaxter Hire7/24/2016, 3:45 PM

## 2014-12-30 NOTE — Progress Notes (Signed)
Skin assessment completed. Joellen Jersey, RN

## 2014-12-31 ENCOUNTER — Other Ambulatory Visit: Payer: Self-pay

## 2014-12-31 ENCOUNTER — Inpatient Hospital Stay: Payer: Medicare Other

## 2014-12-31 ENCOUNTER — Inpatient Hospital Stay: Payer: Medicare Other | Admitting: Oncology

## 2014-12-31 DIAGNOSIS — C9200 Acute myeloblastic leukemia, not having achieved remission: Secondary | ICD-10-CM

## 2014-12-31 LAB — GLUCOSE, CAPILLARY
GLUCOSE-CAPILLARY: 110 mg/dL — AB (ref 65–99)
Glucose-Capillary: 98 mg/dL (ref 65–99)
Glucose-Capillary: 138 mg/dL — ABNORMAL HIGH (ref 65–99)
Glucose-Capillary: 88 mg/dL (ref 65–99)
Glucose-Capillary: 52 mg/dL — ABNORMAL LOW (ref 65–99)

## 2014-12-31 NOTE — Progress Notes (Signed)
Alert and oriented. Paced on tele. Patient's only complaint has been weakness, and a headache that was resolved with tylenol. PT worked with patient today and she had good muscle strength, but got very shaky after standing up a couple of times. Patient is able to lift her hips off the bed to use the bedpan and has good leg strength. Orthostatic vitals are slightly positive. No further needs at this time. Will continue to monitor.

## 2014-12-31 NOTE — Progress Notes (Signed)
Patient ID: [name], female   DOB: [birth year], 79 y.o.   MRN: [id number] SUBJECTIVE:  Pt with h/o AML, refractory to prior treatment, recently admitted with generalized weakness thought to be due to chemo induced pancytopenia. Has had several episodes of onset of bilateral leg weakness while standing, which improved once she sits down.  Had similar episode of this, which prompted admission.   ______________________________________________________________________  ROS: Please see HPI; remainder of complete 10 point ROS is negative   Past Medical History  Diagnosis Date  . Atrial fibrillation   . Parkinson's disease   . Glaucoma   . Hypertension   . Hypothyroidism   . GERD (gastroesophageal reflux disease)   . High cholesterol   . Diabetes mellitus without complication   . CAD (coronary artery disease) of bypass graft   . History of appendectomy   . H/O: hysterectomy   . Leukemia     Past Surgical History  Procedure Laterality Date  . Cardiac valve replacement    . Pacemaker insertion       Current facility-administered medications:  .  0.9 %  sodium chloride infusion, 250 mL, Intravenous, PRN, Baxter Hire, MD .  acetaminophen (TYLENOL) tablet 650 mg, 650 mg, Oral, Q4H PRN, Baxter Hire, MD .  brimonidine (ALPHAGAN) 0.15 % ophthalmic solution 1 drop, 1 drop, Both Eyes, BID, Baxter Hire, MD, 1 drop at 12/30/14 2108 .  carbidopa-levodopa (SINEMET IR) 25-100 MG per tablet immediate release 1 tablet, 1 tablet, Oral, QID, Baxter Hire, MD, 1 tablet at 12/30/14 2108 .  cholecalciferol (VITAMIN D) tablet 1,000 Units, 1,000 Units, Oral, Daily, Baxter Hire, MD, 1,000 Units at 12/30/14 1823 .  cyanocobalamin tablet 250 mcg, 250 mcg, Oral, Daily, Baxter Hire, MD, 250 mcg at 12/30/14 1583 .  enoxaparin (LOVENOX) injection 50 mg, 50 mg, Subcutaneous, Q12H, Baxter Hire, MD, 50 mg at 12/30/14 2108 .  ferrous sulfate tablet 325 mg, 325 mg, Oral, Q breakfast,  Baxter Hire, MD .  furosemide (LASIX) tablet 20 mg, 20 mg, Oral, Daily, Baxter Hire, MD .  glipiZIDE (GLUCOTROL XL) 24 hr tablet 5 mg, 5 mg, Oral, Daily, Baxter Hire, MD .  insulin aspart (novoLOG) injection 0-15 Units, 0-15 Units, Subcutaneous, TID WC, Baxter Hire, MD, 0 Units at 12/30/14 1726 .  insulin aspart (novoLOG) injection 0-5 Units, 0-5 Units, Subcutaneous, QHS, Baxter Hire, MD, 0 Units at 12/30/14 2109 .  latanoprost (XALATAN) 0.005 % ophthalmic solution 1 drop, 1 drop, Both Eyes, QHS, Baxter Hire, MD, 1 drop at 12/30/14 2108 .  [COMPLETED] levofloxacin (LEVAQUIN) tablet 500 mg, 500 mg, Oral, Once, 500 mg at 12/30/14 1822 **FOLLOWED BY** levofloxacin (LEVAQUIN) tablet 250 mg, 250 mg, Oral, Q24H, Tama High III, MD .  levothyroxine (SYNTHROID, LEVOTHROID) tablet 100 mcg, 100 mcg, Oral, QAC breakfast, Baxter Hire, MD .  losartan (COZAAR) tablet 100 mg, 100 mg, Oral, Daily, Baxter Hire, MD, 100 mg at 12/30/14 0940 .  pantoprazole (PROTONIX) EC tablet 40 mg, 40 mg, Oral, Daily, Baxter Hire, MD, 40 mg at 12/30/14 7680 .  prochlorperazine (COMPAZINE) tablet 10 mg, 10 mg, Oral, Q6H PRN, Baxter Hire, MD .  simvastatin (ZOCOR) tablet 20 mg, 20 mg, Oral, QHS, Baxter Hire, MD, 20 mg at 12/30/14 2108 .  sodium chloride 0.9 % injection 3 mL, 3 mL, Intravenous, Q12H, Baxter Hire, MD, 3 mL at 12/30/14 2109 .  sodium chloride 0.9 %  injection 3 mL, 3 mL, Intravenous, PRN, Baxter Hire, MD .  valACYclovir Estell Harpin) tablet 500 mg, 500 mg, Oral, BH-q7a, Baxter Hire, MD, 500 mg at 12/31/14 0700  Facility-Administered Medications Ordered in Other Encounters:  .  0.9 %  sodium chloride infusion, , Intravenous, Continuous, Lloyd Huger, MD, Last Rate: 999 mL/hr at 10/29/14 1550  PHYSICAL EXAM:  BP 151/45 mmHg  Pulse 55  Temp(Src) 98.1 F (36.7 C) (Oral)  Resp 20  Ht 5\' 2"  (1.575 m)  Wt 49.397 kg (108 lb 14.4 oz)  BMI 19.91 kg/m2  SpO2  95%  General: pleasant  female, in NAD HEENT: PERRL; conjunctiva pale;OP moist without lesions. Neck: supple, trachea midline, no thyromegaly Chest: normal to palpation Lungs: few crackles without retractions or wheezes Cardiovascular: mechanical heart valve with 3/6 SEM Abdomen: soft, nontender, nondistended, positive bowel sounds Extremities: no clubbing, cyanosis.  No sig edema Neuro: alert, moves all extremities, with general weakness Derm: no significant rashes or nodules; decreased skin turgor Lymph: no cervical or supraclavicular lymphadenopathy  Labs and imaging studies were reviewed  ASSESSMENT/PLAN:   1. Pancytopenia, chemotherapy induced- stable; following 2. Elevated troponin- due to demand ischemia.  BP reasonable with reduction in BP meds.   3. mech heart valve- on lovenox long term due to heart valve.  Echo shows LV preserved; no significant abnormalities in mech valve 4. DM- off standing dose meds due to hypoglycemia; cover with SSI 5. CAD- cont statin; on lovenox so no ASA.  On sotalol for this and h/o afib.   6. Shaking episode- check orthostatics and follow. 7. Code status- Pt is DNR

## 2014-12-31 NOTE — Progress Notes (Signed)
CBG was 53, patient alert and oriented, asymptomatic for hypoglycemia, Dr. Manuella Ghazi notified, no new orders,  patient given, peanut butter/graham crackers and milk. CBG came 110. Will continue to monitor patient.

## 2014-12-31 NOTE — Progress Notes (Signed)
[facility]  Telephone:([phone]) [phone] [phone]) [phone]  ID: [name] OB: 11/27/1935  MR#: [id number]  ELY#:[id number]  Patient Care Team: Adin Hector, MD as PCP - General (Internal Medicine)  CHIEF COMPLAINT:  Chief Complaint  Patient presents with  . Follow-up    AML    INTERVAL HISTORY: Patient returns to clinic today for further evaluation and treatment planning for her next infusion of Vidaza. She continues to feel weak and fatigued. She denies any fevers. She has no neurologic complaints. She has a fair appetite. She denies any chest pain or shortness of breath. She has no nausea, vomiting, constipation, or diarrhea. Patient offers no further specific complaints.  REVIEW OF SYSTEMS:   Review of Systems  Constitutional: Positive for malaise/fatigue. Negative for fever.  Respiratory: Negative.   Cardiovascular: Negative.   Neurological: Positive for weakness.    As per HPI. Otherwise, a complete review of systems is negatve.  PAST MEDICAL HISTORY: Past Medical History  Diagnosis Date  . Atrial fibrillation   . Parkinson's disease   . Glaucoma   . Hypertension   . Hypothyroidism   . GERD (gastroesophageal reflux disease)   . High cholesterol   . Diabetes mellitus without complication   . CAD (coronary artery disease) of bypass graft   . History of appendectomy   . H/O: hysterectomy   . Leukemia     PAST SURGICAL HISTORY: Past Surgical History  Procedure Laterality Date  . Cardiac valve replacement    . Pacemaker insertion      FAMILY HISTORY Family History  Problem Relation Age of Onset  . CAD Other   . Heart disease Father        ADVANCED DIRECTIVES:    HEALTH MAINTENANCE: History  Substance Use Topics  . Smoking status: Former Smoker -- 1.00 packs/day for 10 years    Types: Cigarettes  . Smokeless tobacco: Not on file  . Alcohol Use: No     Colonoscopy:  PAP:  Bone density:  Lipid panel:  Allergies    Allergen Reactions  . Hydrochlorothiazide Other (See Comments)    Worsening hypercalcemia Worsening hypercalcemia Reaction:  Unknown   . Lisinopril Cough and Other (See Comments)    Worsening renal insufficiency at 44m dose Other reaction(s): Other (See Comments) Worsening renal insufficiency at 450mdose  . Losartan Other (See Comments)    Worsened renal insufficiency Other reaction(s): Other (See Comments) Worsened renal insufficiency Reaction:  Unknown     No current facility-administered medications for this visit.   No current outpatient prescriptions on file.   Facility-Administered Medications Ordered in Other Visits  Medication Dose Route Frequency Provider Last Rate Last Dose  . 0.9 %  sodium chloride infusion   Intravenous Continuous TiLloyd HugerMD 999 mL/hr at 10/29/14 1550    . 0.9 %  sodium chloride infusion  250 mL Intravenous PRN JoBaxter HireMD      . acetaminophen (TYLENOL) tablet 650 mg  650 mg Oral Q4H PRN JoBaxter HireMD   650 mg at 12/31/14 0831  . brimonidine (ALPHAGAN) 0.15 % ophthalmic solution 1 drop  1 drop Both Eyes BID JoBaxter HireMD   1 drop at 12/31/14 1006  . carbidopa-levodopa (SINEMET IR) 25-100 MG per tablet immediate release 1 tablet  1 tablet Oral QID JoBaxter HireMD   1 tablet at 12/31/14 1406  . cholecalciferol (VITAMIN D) tablet 1,000 Units  1,000 Units Oral Daily JoChrystie Nose  Edwina Barth, MD   1,000 Units at 12/31/14 1005  . cyanocobalamin tablet 250 mcg  250 mcg Oral Daily Baxter Hire, MD   250 mcg at 12/31/14 1005  . enoxaparin (LOVENOX) injection 50 mg  50 mg Subcutaneous Q12H Baxter Hire, MD   50 mg at 12/31/14 1005  . ferrous sulfate tablet 325 mg  325 mg Oral Q breakfast Baxter Hire, MD   325 mg at 12/31/14 0827  . furosemide (LASIX) tablet 20 mg  20 mg Oral Daily Baxter Hire, MD   20 mg at 12/31/14 1006  . glipiZIDE (GLUCOTROL XL) 24 hr tablet 5 mg  5 mg Oral Daily Baxter Hire, MD   5 mg at 12/31/14  1005  . insulin aspart (novoLOG) injection 0-15 Units  0-15 Units Subcutaneous TID WC Baxter Hire, MD   2 Units at 12/31/14 1200  . insulin aspart (novoLOG) injection 0-5 Units  0-5 Units Subcutaneous QHS Baxter Hire, MD   0 Units at 12/30/14 2109  . latanoprost (XALATAN) 0.005 % ophthalmic solution 1 drop  1 drop Both Eyes QHS Baxter Hire, MD   1 drop at 12/30/14 2108  . levofloxacin (LEVAQUIN) tablet 250 mg  250 mg Oral Q24H Tama High III, MD      . levothyroxine (SYNTHROID, LEVOTHROID) tablet 100 mcg  100 mcg Oral QAC breakfast Baxter Hire, MD   100 mcg at 12/31/14 0827  . losartan (COZAAR) tablet 100 mg  100 mg Oral Daily Baxter Hire, MD   100 mg at 12/31/14 1005  . pantoprazole (PROTONIX) EC tablet 40 mg  40 mg Oral Daily Baxter Hire, MD   40 mg at 12/31/14 1006  . prochlorperazine (COMPAZINE) tablet 10 mg  10 mg Oral Q6H PRN Baxter Hire, MD      . simvastatin (ZOCOR) tablet 20 mg  20 mg Oral QHS Baxter Hire, MD   20 mg at 12/30/14 2108  . sodium chloride 0.9 % injection 3 mL  3 mL Intravenous Q12H Baxter Hire, MD   3 mL at 12/31/14 1006  . sodium chloride 0.9 % injection 3 mL  3 mL Intravenous PRN Baxter Hire, MD      . valACYclovir (VALTREX) tablet 500 mg  500 mg Oral BH-q7a Baxter Hire, MD   500 mg at 12/31/14 0700    OBJECTIVE: Filed Vitals:   12/17/14 1430  Temp: 97.1 F (36.2 C)     There is no weight on file to calculate BMI.    ECOG FS:2 - Symptomatic, <50% confined to bed  General: Well-developed, well-nourished, no acute distress. Eyes: anicteric sclera. Lungs: Clear to auscultation bilaterally. Heart: Regular rate and rhythm. No rubs, murmurs, or gallops. Abdomen: Soft, nontender, nondistended. No organomegaly noted, normoactive bowel sounds. Musculoskeletal: No edema, cyanosis, or clubbing. Neuro: Alert, answering all questions appropriately. Cranial nerves grossly intact. Skin: No rashes or petechiae noted. Psych: Normal  affect.   LAB RESULTS:  Lab Results  Component Value Date   NA 137 12/30/2014   K 4.5 12/30/2014   CL 103 12/30/2014   CO2 26 12/30/2014   GLUCOSE 92 12/30/2014   BUN 9 12/30/2014   CREATININE 0.91 12/30/2014   CALCIUM 10.0 12/30/2014   PROT 6.6 12/30/2014   ALBUMIN 3.4* 12/30/2014   AST 16 12/30/2014   ALT 7* 12/30/2014   ALKPHOS 67 12/30/2014   BILITOT 0.7 12/30/2014   GFRNONAA 59* 12/30/2014  GFRAA >60 12/30/2014    Lab Results  Component Value Date   WBC 1.7* 12/30/2014   NEUTROABS 0.7* 12/30/2014   HGB 11.1* 12/30/2014   HCT 32.9* 12/30/2014   MCV 87.1 12/30/2014   PLT 176 12/30/2014     STUDIES: Dg Chest 1 View  12/30/2014   CLINICAL DATA:  Increasing weakness over the past couple of weeks.  EXAM: CHEST  1 VIEW  COMPARISON:  Single view of the chest 10/30/2014. PA and lateral chest 06/11/2014.  FINDINGS: Dual lead pacing device is in place in the patient is status post CABG. There is cardiomegaly and pulmonary vascular congestion, unchanged in appearance. No consolidative process, pneumothorax or effusion.  IMPRESSION: Cardiomegaly and pulmonary vascular congestion.  No acute finding.   Electronically Signed   By: Inge Rise M.D.   On: 12/30/2014 13:02    ASSESSMENT: AML  PLAN:    1. AML: Hospice and comfort care were previously discussed with the patient, but she wishes to pursue additional treatment at this time. Her most recent bone marrow biopsy was improved, but still contained AML at approximately 19% of her sample. She has now completed 4 cycles of azacitidine 75 mg/m IV. She will receive this treatment on days 1 through 5 as well as on day 8 and 9. Given her neutropenia and anemia, will delay her treatment 1 week. Return to clinic next Monday for consideration of cycle 5. 2. Pancytopenia: Secondary to AML as well as chemotherapy. Monitor. 3. Anemia: Return to clinic on Wednesday for one unit of packed red blood cells.  Patient expressed  understanding and was in agreement with this plan. She also understands that She can call clinic at any time with any questions, concerns, or complaints.   Lloyd Huger, MD   12/31/2014 6:06 PM

## 2014-12-31 NOTE — Care Management (Signed)
Patient was recently discharged from [facility] with referral for [facility] home [facility]. Patient was openned to the agency 7/22 and readmitted to [facility] for weakness and fluid overload. Patient is currently undergoing chemo for leukemia. Physical therapy has evaluated and recommendation is skilled nursing.  REferral to CSW.

## 2014-12-31 NOTE — Progress Notes (Signed)
Visit made. Mrs. Honold was admitted to [facility] services on 7/22 with Nursing and Physical Therapy ordered.  Patient seen resting in bed, appears pale, husband at bedside. CMRN Nann Carlota Raspberry is  aware that patient was followed by [facility] prior to this hospitalization. PT evaluation today is recommending SNF placement. Will continue to follow through final disposition. Flo Shanks RN, BSN, [facility] Liaison [phone]

## 2014-12-31 NOTE — Evaluation (Signed)
Physical Therapy Evaluation Patient Details Name: [name] MRN: [id number] DOB: 1936/01/22 Today's Date: 12/31/2014   History of Present Illness  Patient is a 79 y/o female with past medical history of Parkinson's, AML, A-fib, CHF, CAD, and pacemaker placement. She has just completed a course of chemotherapy and continues to have low WBC. Patient admitted after feeling generally weak and having difficulty ambulating at home.     Clinical Impression  Pt was ready to start PT today and reports her FOF during random times of the day.  She is concerned that her husband can't pick her up from the floor if she is to fall. Pt bed mobility transfers require min assist with use of handrail/HHA. Pt was able to get to standing on first attempt using a RW with mod assist with slight quivering in bilat LE. After 2 min, pt required rest break and transitioned back to sitting.  We attempted 5 more times after break with no success to return to standing.  During the initiation phase of sit to stand, pt bilat LE quiver excessively and pt is unable to extend hips/knees. From supine to standing, vitals dropped from 169/95 to 131/65, nursing was informed and aware. Pt would benefit from skilled PT in order to increase LE strength and decrease quivering with sit to stand transfers.     Follow Up Recommendations SNF    Equipment Recommendations  Rolling walker with 5" wheels    Recommendations for Other Services       Precautions / Restrictions Precautions Precautions: Fall      Mobility  Bed Mobility Overal bed mobility: Modified Independent             General bed mobility comments: Pt requires handrail/HHA to transfer to sitting at the EOB. It was easier for the pt to get up with bed elevated.   Transfers Overall transfer level: Needs assistance Equipment used: Rolling walker (2 wheeled) Transfers: Sit to/from Stand Sit to Stand: Mod assist         General transfer comment: Pt was  able to get to standing on first attempt with slight quivering in bilat LE. After 2 min, pt required rest break and transitioned back to sitting.  We attempted 5 more times after break with no success to return to standing.  During the initiation phase, pt LE quiver excessively and pt is unable to extend hips/knees.   Ambulation/Gait             General Gait Details: Pt was unable to walk today secondary to not being able to remain in standing.   Stairs            Wheelchair Mobility    Modified Rankin (Stroke Patients Only)       Balance Overall balance assessment: Needs assistance Sitting-balance support: Feet unsupported;Single extremity supported Sitting balance-Leahy Scale: Fair Sitting balance - Comments: She is able to lean forward and perform cleaning on bedside commode without loss of balance.    Standing balance support: Bilateral upper extremity supported Standing balance-Leahy Scale: Fair Standing balance comment: Once standing, pt displays intermittent LE quivering/LOB and requires mod assist during those episodes.                              Pertinent Vitals/Pain Pain Assessment: No/denies pain    Home Living Family/patient expects to be discharged to:: Private residence Living Arrangements: Spouse/significant other Available Help at Discharge: Family Type of  Home: House Home Access: [place]: One level Home Equipment: [place] - 2 wheels;Cane - single point;Bedside commode;Shower seat      Prior Function Level of Independence: Independent with assistive device(s)         Comments: uses SPC     Hand Dominance        Extremity/Trunk Assessment   Upper Extremity Assessment: Overall WFL for tasks assessed           Lower Extremity Assessment: Overall WFL for tasks assessed (LE MMT 4/5 grossly)      Cervical / Trunk Assessment: Kyphotic  Communication   Communication: No difficulties;Expressive  difficulties  Cognition Arousal/Alertness: Awake/alert Behavior During Therapy: WFL for tasks assessed/performed;Flat affect Overall Cognitive Status: Within Functional Limits for tasks assessed                      General Comments      Exercises Other Exercises Other Exercises: Seated bilat hip marches/LAQs, 2 x 10. (10 min)      Assessment/Plan    PT Assessment Patient needs continued PT services  PT Diagnosis Difficulty walking;Generalized weakness   PT Problem List Decreased strength;Decreased knowledge of use of DME;Decreased activity tolerance;Decreased balance;Decreased mobility;Decreased coordination  PT Treatment Interventions Gait training;DME instruction;Balance training;Therapeutic activities;Therapeutic exercise;Patient/family education   PT Goals (Current goals can be found in the Care Plan section) Acute Rehab PT Goals Patient Stated Goal: To return home safely when appropriate.  PT Goal Formulation: With patient Time For Goal Achievement: 01/14/15 Potential to Achieve Goals: Fair    Frequency Min 2X/week   Barriers to discharge   Pt husband is able to stay with her 24/7 but is unable to lift her from the ground if she falls.     Co-evaluation               End of Session Equipment Utilized During Treatment: Gait belt Activity Tolerance: Patient tolerated treatment well;Patient limited by fatigue Patient left: in bed;with call bell/phone within reach;with bed alarm set;with nursing/sitter in room Nurse Communication: Mobility status;Precautions         Time: 7062-3762 PT Time Calculation (min) (ACUTE ONLY): 27 min   Charges:         PT G Codes:        Bernestine Amass, SPT 12/31/2014 10:30 AM

## 2015-01-01 LAB — CBC
HCT: 31.9 % — ABNORMAL LOW (ref 35.0–47.0)
Hemoglobin: 10.9 g/dL — ABNORMAL LOW (ref 12.0–16.0)
MCH: 29.6 pg (ref 26.0–34.0)
MCHC: 34.1 g/dL (ref 32.0–36.0)
MCV: 86.7 fL (ref 80.0–100.0)
Platelets: 165 K/uL (ref 150–440)
RBC: 3.68 MIL/uL — ABNORMAL LOW (ref 3.80–5.20)
RDW: 17.2 % — ABNORMAL HIGH (ref 11.5–14.5)
WBC: 1.6 K/uL — ABNORMAL LOW (ref 3.6–11.0)

## 2015-01-01 LAB — BASIC METABOLIC PANEL WITH GFR
Anion gap: 5 (ref 5–15)
BUN: 14 mg/dL (ref 6–20)
CO2: 30 mmol/L (ref 22–32)
Calcium: 9.5 mg/dL (ref 8.9–10.3)
Chloride: 102 mmol/L (ref 101–111)
Creatinine, Ser: 1.03 mg/dL — ABNORMAL HIGH (ref 0.44–1.00)
GFR calc Af Amer: 59 mL/min — ABNORMAL LOW
GFR calc non Af Amer: 51 mL/min — ABNORMAL LOW
Glucose, Bld: 97 mg/dL (ref 65–99)
Potassium: 3.3 mmol/L — ABNORMAL LOW (ref 3.5–5.1)
Sodium: 137 mmol/L (ref 135–145)

## 2015-01-01 LAB — HEPATIC FUNCTION PANEL
AST: 11 U/L — ABNORMAL LOW (ref 15–41)
Albumin: 3.5 g/dL (ref 3.5–5.0)
Alkaline Phosphatase: 72 U/L (ref 38–126)
Bilirubin, Direct: <0.1 mg/dL — ABNORMAL LOW (ref 0.1–0.5)
Total Bilirubin: 0.3 mg/dL (ref 0.3–1.2)
Total Protein: 6.8 g/dL (ref 6.5–8.1)

## 2015-01-01 LAB — CK: Total CK: 14 U/L — ABNORMAL LOW (ref 38–234)

## 2015-01-01 LAB — FOLATE: FOLATE: 12.7 ng/mL (ref >5.9)

## 2015-01-01 LAB — GLUCOSE, CAPILLARY
GLUCOSE-CAPILLARY: 96 mg/dL (ref 65–99)
Glucose-Capillary: 168 mg/dL — ABNORMAL HIGH (ref 65–99)
Glucose-Capillary: 152 mg/dL — ABNORMAL HIGH (ref 65–99)
Glucose-Capillary: 102 mg/dL — ABNORMAL HIGH (ref 65–99)

## 2015-01-01 LAB — SEDIMENTATION RATE: Sed Rate: 47 mm/hr — ABNORMAL HIGH (ref 0–30)

## 2015-01-01 LAB — TSH: TSH: 5.584 u[IU]/mL — AB (ref 0.350–4.500)

## 2015-01-01 MED ORDER — POTASSIUM CHLORIDE CRYS ER 20 MEQ PO TBCR
20.0000 meq | EXTENDED_RELEASE_TABLET | Freq: Once | ORAL | Status: AC
  Administered 2015-01-01: 20 meq via ORAL
  Filled 2015-01-01: qty 1

## 2015-01-01 NOTE — Consult Note (Signed)
Reason for Consult: weakness Referring Physician: [name] is an 79 y.o. female.  HPI:  Seen at request of [name] for leg weakness.  79 yo RHD F with multiple medical problems presents to [facility] with leg weakness.  She was just admitted two weeks ago with the same complaint but this has not improved.  Per husband,  Weakness started about 2 months ago when she needed a cane to ambulate and then progressed to wheelchair about two weeks ago.  Pt does report some back pain and some constipation as well as bladder fullness.  She also states that she was diagnosed with Parkinsons about one year ago and had some freezing which was relieved by Sinemet.  She denies pain in her legs or numbness.  Past Medical History  Diagnosis Date  . Atrial fibrillation   . Parkinson's disease   . Glaucoma   . Hypertension   . Hypothyroidism   . GERD (gastroesophageal reflux disease)   . High cholesterol   . Diabetes mellitus without complication   . CAD (coronary artery disease) of bypass graft   . History of appendectomy   . H/O: hysterectomy   . Leukemia     Past Surgical History  Procedure Laterality Date  . Cardiac valve replacement    . Pacemaker insertion      Family History  Problem Relation Age of Onset  . CAD Other   . Heart disease Father     Social History:  reports that she has quit smoking. Her smoking use included Cigarettes. She has a 10 pack-year smoking history. She does not have any smokeless tobacco history on file. She reports that she does not drink alcohol or use illicit drugs.  Allergies:  Allergies  Allergen Reactions  . Hydrochlorothiazide Other (See Comments)    Worsening hypercalcemia Worsening hypercalcemia Reaction:  Unknown   . Lisinopril Cough and Other (See Comments)    Worsening renal insufficiency at 47m dose Other reaction(s): Other (See Comments) Worsening renal insufficiency at 429mdose    Medications: personally reviewed by me as  per chart  Results for orders placed or performed during the hospital encounter of 12/30/14 (from the past 48 hour(s))  Lactic acid, plasma     Status: None   Collection Time: 12/30/14  5:06 PM  Result Value Ref Range   Lactic Acid, Venous 1.0 0.5 - 2.0 mmol/L  Glucose, capillary     Status: Abnormal   Collection Time: 12/30/14  5:22 PM  Result Value Ref Range   Glucose-Capillary 115 (H) 65 - 99 mg/dL  Glucose, capillary     Status: Abnormal   Collection Time: 12/30/14  8:06 PM  Result Value Ref Range   Glucose-Capillary 134 (H) 65 - 99 mg/dL  Glucose, capillary     Status: None   Collection Time: 12/31/14  7:29 AM  Result Value Ref Range   Glucose-Capillary 98 65 - 99 mg/dL  Glucose, capillary     Status: Abnormal   Collection Time: 12/31/14 11:37 AM  Result Value Ref Range   Glucose-Capillary 138 (H) 65 - 99 mg/dL  Glucose, capillary     Status: None   Collection Time: 12/31/14  4:07 PM  Result Value Ref Range   Glucose-Capillary 88 65 - 99 mg/dL  Glucose, capillary     Status: Abnormal   Collection Time: 12/31/14  8:37 PM  Result Value Ref Range   Glucose-Capillary 52 (L) 65 - 99 mg/dL  Glucose, capillary  Status: Abnormal   Collection Time: 12/31/14  9:33 PM  Result Value Ref Range   Glucose-Capillary 110 (H) 65 - 99 mg/dL  Basic metabolic panel     Status: Abnormal   Collection Time: 01/01/15  5:03 AM  Result Value Ref Range   Sodium 137 135 - 145 mmol/L   Potassium 3.3 (L) 3.5 - 5.1 mmol/L   Chloride 102 101 - 111 mmol/L   CO2 30 22 - 32 mmol/L   Glucose, Bld 97 65 - 99 mg/dL   BUN 14 6 - 20 mg/dL   Creatinine, Ser 1.03 (H) 0.44 - 1.00 mg/dL   Calcium 9.5 8.9 - 10.3 mg/dL   GFR calc non Af Amer 51 (L) >60 mL/min   GFR calc Af Amer 59 (L) >60 mL/min    Comment: (NOTE) The eGFR has been calculated using the CKD EPI equation. This calculation has not been validated in all clinical situations. eGFR's persistently <60 mL/min signify possible Chronic  Kidney Disease.    Anion gap 5 5 - 15  CBC     Status: Abnormal   Collection Time: 01/01/15  5:03 AM  Result Value Ref Range   WBC 1.6 (L) 3.6 - 11.0 K/uL   RBC 3.68 (L) 3.80 - 5.20 MIL/uL   Hemoglobin 10.9 (L) 12.0 - 16.0 g/dL   HCT 31.9 (L) 35.0 - 47.0 %   MCV 86.7 80.0 - 100.0 fL   MCH 29.6 26.0 - 34.0 pg   MCHC 34.1 32.0 - 36.0 g/dL   RDW 17.2 (H) 11.5 - 14.5 %   Platelets 165 150 - 440 K/uL  Glucose, capillary     Status: None   Collection Time: 01/01/15  7:23 AM  Result Value Ref Range   Glucose-Capillary 96 65 - 99 mg/dL  Glucose, capillary     Status: Abnormal   Collection Time: 01/01/15 11:01 AM  Result Value Ref Range   Glucose-Capillary 168 (H) 65 - 99 mg/dL    No results found.  Review of Systems  Constitutional: Positive for malaise/fatigue. Negative for fever, chills, weight loss and diaphoresis.  HENT: Negative.   Eyes: Negative.   Respiratory: Negative.   Cardiovascular: Negative.   Gastrointestinal: Positive for constipation. Negative for heartburn, nausea, vomiting, abdominal pain and diarrhea.  Genitourinary: Positive for urgency and flank pain. Negative for dysuria, frequency and hematuria.  Musculoskeletal: Positive for back pain and falls. Negative for myalgias, joint pain and neck pain.  Skin: Negative.   Neurological: Positive for weakness. Negative for dizziness, tingling, tremors, sensory change, speech change, focal weakness, seizures and loss of consciousness.   Blood pressure 124/43, pulse 43, temperature 98 F (36.7 C), temperature source Oral, resp. rate 18, height 5' 2"  (1.575 m), weight 49.397 kg (108 lb 14.4 oz), SpO2 98 %. Physical Exam  Constitutional: She is oriented to person, place, and time. She appears well-developed and well-nourished. No distress.  HENT:  Head: Normocephalic and atraumatic.  Nose: Nose normal.  Mouth/Throat: Oropharynx is clear and moist.  Eyes: EOM are normal. Pupils are equal, round, and reactive to light.  No scleral icterus.  Neck: Normal range of motion. Neck supple.  Cardiovascular: Normal rate, regular rhythm and normal heart sounds.   No murmur heard. Respiratory: Effort normal and breath sounds normal. No respiratory distress. She has no wheezes.  GI: Soft. Bowel sounds are normal.  Musculoskeletal: Normal range of motion. She exhibits no tenderness.  Neurological: She is alert and oriented to person, place, and time. No  cranial nerve deficit or sensory deficit.  A+Ox3, nl speech and language PERRLA, EOMI, nl VF, face symmetric 5/5 B UE, 3/5 B hip flexors and knee extension, no tenderness, no atrophy, no fasciulations;  Slightly increased tone R>L, no tremor, mild bradykinesia FTN WNL, untestable HTS, paraplegic gait 0+/4 B, mute plantars Decreased pinprick on R L5 and some stocking and glove changes    Assessment/Plan: 1.  Weakness-  This is unclear and multifactorial.  Pattern of weakness is mainly hip flexors and quadraceps which can be seen in myositis type picture.  Pt is on statin which could do this.  Pt does have some R sided sensory changes and bowel bladder problems which can be seen in a spinal problem but there are no upper motor findings to support this. 2.  Parkinsons-  Appears to be well controlled but could contribute some to 1. 3.  Peripheral neuropathy-  Mild and likely related to age -  Check CPK, myoglobin, ammonia, LFTs, B12, TSH -  D/c statin -  Continue Sinemet 25/142m QID -  Continue therapy -  Pt may need myelogram but would get outpatient EMG/NCS first -  Will follow  [name] 01/01/2015, 2:53 PM

## 2015-01-01 NOTE — Clinical Social Work Placement (Signed)
   CLINICAL SOCIAL WORK PLACEMENT  NOTE  Date:  01/01/2015  Patient Details  Name: [name] MRN: [id number] Date of Birth: [birth year]  Clinical Social Work is seeking post-discharge placement for this patient at the [place] level of care (*CSW will initial, date and re-position this form in  chart as items are completed):  Yes   Patient/family provided with [place] Work Department's list of facilities offering this level of care within the geographic area requested by the patient (or if unable, by the patient's family).  Yes   Patient/family informed of their freedom to choose among providers that offer the needed level of care, that participate in Medicare, Medicaid or managed care program needed by the patient, have an available bed and are willing to accept the patient.  Yes   Patient/family informed of [facility]'s ownership interest in [facility] and [facility], as well as of the fact that they are under no obligation to receive care at these facilities.  PASRR submitted to EDS on       PASRR number received on       Existing PASRR number confirmed on 01/01/15     FL2 transmitted to all facilities in geographic area requested by pt/family on 01/01/15     FL2 transmitted to all facilities within larger geographic area on       Patient informed that his/her managed care company has contracts with or will negotiate with certain facilities, including the following:   (pt was given a packet with this information on it)         Patient/family informed of bed offers received.  Patient chooses bed at       Physician recommends and patient chooses bed at      Patient to be transferred to   on  .  Patient to be transferred to facility by       Patient family notified on   of transfer.  Name of family member notified:        PHYSICIAN     Please sign FL2  Additional Comment:   CSW will update pt once bed offers have  been received._______________________________________________ Mathews Argyle, LCSW 01/01/2015, 2:03 PM

## 2015-01-01 NOTE — Progress Notes (Signed)
Chart notes reviewed and patient discussed with CSW Caryl Pina and [facility], current plan is for patient to discharge to SNF for rehab. Updated notes sent to referral, [facility] team updated. Will continue to follow through final disposition. Thank you. Flo Shanks RN, BSN, [place] [facility] [place], [facility] [phone] c

## 2015-01-01 NOTE — Care Management Important Message (Signed)
Important Message  Patient Details  Name: [name] MRN: [id number] Date of Birth: 06/13/1935   Medicare Important Message Given:  Yes-second notification given    [name] 01/01/2015, 9:52 AM

## 2015-01-01 NOTE — Progress Notes (Signed)
Physical Therapy Treatment Patient Details Name: [name] MRN: [id number] DOB: 14-Jun-1935 Today's Date: 01/01/2015    History of Present Illness Patient is a 79 y/o female with past medical history of Parkinson's, AML, A-fib, CHF, CAD, and pacemaker placement. She has just completed a course of chemotherapy and continues to have low WBC. Patient admitted after feeling generally weak and having difficulty ambulating at home.     PT Comments    Pt was notified from PT regarding safety that will be provided with all therex today. Pt was able to get up to standing after first attempt today. Regarding bed mobility, pt requires handrail/HHA to transfer to sitting at the EOB, min assist.  To address the pt's anxiety with standing, PT verified to the pt how safe the transfer would be and created a low stimulus enviroment to calm the patient down. Pt displayed min quivering in hip flexors and knee flexors but is able to get up with RW/min assist, BP dropped from 130/60 in sitting to 110/52 in standing. Pt was able to walk with RW requiring min assist and with second PT behind her with recliner chair. Pt reported she needed to stop and LE/UE quivered on her quick descent to the recliner chair, requires max assist to safely bring her back down. Pt would benefit from skilled PT in order to increase LE strength and decrease quivering/fear with sit to stand transfers.     Follow Up Recommendations  SNF     Equipment Recommendations  Rolling walker with 5" wheels    Recommendations for Other Services       Precautions / Restrictions Precautions Precautions: Fall Restrictions Weight Bearing Restrictions: No    Mobility  Bed Mobility Overal bed mobility: Needs Assistance Bed Mobility: Sidelying to Sit   Sidelying to sit: Min assist       General bed mobility comments: Pt requires handrail/HHA to transfer to sitting at the EOB.   Transfers Overall transfer level: Needs  assistance Equipment used: Rolling walker (2 wheeled) Transfers: Sit to/from Stand Sit to Stand: Min assist         General transfer comment: Pt was able to get up to standing after first attempt today.  PT verified to the pt how safe the transfer would be and created a low stimulus enviroment to calm the patient down. Pt displayed min quivering in hip flexors and knee flexors but is able to get up with RW/min assist.   Ambulation/Gait Ambulation/Gait assistance: Min assist Ambulation Distance (Feet): 30 Feet Assistive device: Rolling walker (2 wheeled)     Gait velocity interpretation: Below normal speed for age/gender General Gait Details: Pt preents with step to pattern with shortened step length and required min assist with a RW. Pt reported she needed to stop and LE/UE quivered on her quick descent to the chair, requiring max assist.    Stairs            Wheelchair Mobility    Modified Rankin (Stroke Patients Only)       Balance Overall balance assessment: Needs assistance Sitting-balance support: Single extremity supported;Feet supported Sitting balance-Leahy Scale: Fair     Standing balance support: Bilateral upper extremity supported Standing balance-Leahy Scale: Poor Standing balance comment: Once standing, pt displays inermittent LE quivering/LOB and requires mod assist during these episodes.                     Cognition Arousal/Alertness: Awake/alert Behavior During Therapy: WFL for tasks assessed/performed;Flat affect  Overall Cognitive Status: Within Functional Limits for tasks assessed                      Exercises Other Exercises Other Exercises: Seated bilat hip marches/LAQs/, 2 x 10.  Other Exercises: Seated bilat shoulder flexion-abduction-extension therex, 1 x 10.    General Comments        Pertinent Vitals/Pain      Home Living                      Prior Function            PT Goals (current goals can  now be found in the care plan section) Acute Rehab PT Goals Patient Stated Goal: To return home safely when appropriate.  PT Goal Formulation: With patient Time For Goal Achievement: 01/14/15 Potential to Achieve Goals: Fair Progress towards PT goals: Progressing toward goals    Frequency  Min 2X/week    PT Plan Current plan remains appropriate    Co-evaluation             End of Session Equipment Utilized During Treatment: Gait belt Activity Tolerance: Patient tolerated treatment well;Patient limited by fatigue Patient left: in bed;with call bell/phone within reach;with bed alarm set;with nursing/sitter in room     Time: 1415-1451 PT Time Calculation (min) (ACUTE ONLY): 36 min  Charges:                       G CodesBernestine Amass, SPT 2015-01-09 3:08 PM

## 2015-01-01 NOTE — Clinical Social Work Note (Signed)
Clinical Social Work Assessment  Patient Details  Name: [name] MRN: [id number] Date of Birth: [birth year]  Date of referral:  01/01/15               Reason for consult:  Facility Placement                Permission sought to share information with:  Facility Sport and exercise psychologist, Family Supports Permission granted to share information::  Yes, Verbal Permission Granted  Name::     Physiological scientist::  SNF facilities in [place]  Relationship::  Husband (John)  Sport and exercise psychologist Information:  [phone]  Housing/Transportation Living arrangements for the past 2 months:  [place] of Information:  Patient, Medical Team Patient Interpreter Needed:  None Criminal Activity/Legal Involvement Pertinent to Current Situation/Hospitalization:  No - Comment as needed Significant Relationships:  Spouse Lives with:  Spouse Do you feel safe going back to the place where you live?  Yes Need for family participation in patient care:  Yes (Comment)  Care giving concerns:  Pt stated that she was not sure about a rehab facility, but that she would agree to go to one if that was the recommendation.   Social Worker assessment / plan:  CSW spoke to pt.  SHe was sitting up in bed awake and Ox4.  She stated that she was from home with her husband [name].  No one else is in the home with them.  SHe stated that she used a cane prior to being admitted to the hospital, but she stated that she thought she would do better with a walker.  The walker that she has at home is too big for her.  CSW explined what a SNF placement was to the pt.  She stated the she was familiar with SNF placement.  SHe did not like being there, but she was willing to go back if that was the recommendation.   Employment status:  Disabled (Comment on whether or not currently receiving Disability), Retired Forensic scientist:    PT Recommendations:  [place] / Referral to community resources:  [place]  Patient/Family's Response to care:  Pt was in agreement with SNF placement.  Patient/Family's Understanding of and Emotional Response to Diagnosis, Current Treatment, and Prognosis:  Pt was in agreement with SNF placement and verbalized her understanding of SNF DC  Emotional Assessment Appearance:  Appears stated age Attitude/Demeanor/Rapport:   (pleasent ) Affect (typically observed):   (tired ) Orientation:  Oriented to Self, Oriented to Place, Oriented to  Time, Oriented to Situation Alcohol / Substance use:  Never Used Psych involvement (Current and /or in the community):  No (Comment)  Discharge Needs  Concerns to be addressed:  Care Coordination Readmission within the last 30 days:  Yes Current discharge risk:  Physical Impairment Barriers to Discharge:  No Barriers Identified   Mathews Argyle, LCSW 01/01/2015, 1:45 PM

## 2015-01-01 NOTE — Progress Notes (Signed)
Patient ID: [name], female   DOB: Jun 11, 1935, 79 y.o.   MRN: [id number] SUBJECTIVE:  Pt with h/o AML, refractory to prior treatment, recently admitted with generalized weakness thought to be due to chemo induced pancytopenia. Has had several episodes of onset of bilateral leg weakness while standing, which improved once she sits down.  Had similar episode of this, which prompted admission. Had additional episode with PT yesterday.  Orthostatics reveal drop from lying to sitting.  No chest pain or dyspnea  ______________________________________________________________________  ROS: Please see HPI; remainder of complete 10 point ROS is negative   Past Medical History  Diagnosis Date  . Atrial fibrillation   . Parkinson's disease   . Glaucoma   . Hypertension   . Hypothyroidism   . GERD (gastroesophageal reflux disease)   . High cholesterol   . Diabetes mellitus without complication   . CAD (coronary artery disease) of bypass graft   . History of appendectomy   . H/O: hysterectomy   . Leukemia     Past Surgical History  Procedure Laterality Date  . Cardiac valve replacement    . Pacemaker insertion       Current facility-administered medications:  .  0.9 %  sodium chloride infusion, 250 mL, Intravenous, PRN, Baxter Hire, MD .  acetaminophen (TYLENOL) tablet 650 mg, 650 mg, Oral, Q4H PRN, Baxter Hire, MD, 650 mg at 12/31/14 0831 .  brimonidine (ALPHAGAN) 0.15 % ophthalmic solution 1 drop, 1 drop, Both Eyes, BID, Baxter Hire, MD, 1 drop at 12/31/14 2145 .  carbidopa-levodopa (SINEMET IR) 25-100 MG per tablet immediate release 1 tablet, 1 tablet, Oral, QID, Baxter Hire, MD, 1 tablet at 12/31/14 2144 .  cholecalciferol (VITAMIN D) tablet 1,000 Units, 1,000 Units, Oral, Daily, Baxter Hire, MD, 1,000 Units at 12/31/14 1005 .  cyanocobalamin tablet 250 mcg, 250 mcg, Oral, Daily, Baxter Hire, MD, 250 mcg at 12/31/14 1005 .  enoxaparin (LOVENOX) injection 50  mg, 50 mg, Subcutaneous, Q12H, Baxter Hire, MD, 50 mg at 12/31/14 2145 .  ferrous sulfate tablet 325 mg, 325 mg, Oral, Q breakfast, Baxter Hire, MD, 325 mg at 12/31/14 0827 .  furosemide (LASIX) tablet 20 mg, 20 mg, Oral, Daily, Baxter Hire, MD, 20 mg at 12/31/14 1006 .  glipiZIDE (GLUCOTROL XL) 24 hr tablet 5 mg, 5 mg, Oral, Daily, Baxter Hire, MD, 5 mg at 12/31/14 1005 .  insulin aspart (novoLOG) injection 0-15 Units, 0-15 Units, Subcutaneous, TID WC, Baxter Hire, MD, 2 Units at 12/31/14 1200 .  insulin aspart (novoLOG) injection 0-5 Units, 0-5 Units, Subcutaneous, QHS, Baxter Hire, MD, 0 Units at 12/30/14 2109 .  latanoprost (XALATAN) 0.005 % ophthalmic solution 1 drop, 1 drop, Both Eyes, QHS, Baxter Hire, MD, 1 drop at 12/31/14 2148 .  [COMPLETED] levofloxacin (LEVAQUIN) tablet 500 mg, 500 mg, Oral, Once, 500 mg at 12/30/14 1822 **FOLLOWED BY** levofloxacin (LEVAQUIN) tablet 250 mg, 250 mg, Oral, Q24H, Tama High III, MD, 250 mg at 12/31/14 1827 .  levothyroxine (SYNTHROID, LEVOTHROID) tablet 100 mcg, 100 mcg, Oral, QAC breakfast, Baxter Hire, MD, 100 mcg at 12/31/14 0827 .  losartan (COZAAR) tablet 100 mg, 100 mg, Oral, Daily, Baxter Hire, MD, 100 mg at 12/31/14 1005 .  pantoprazole (PROTONIX) EC tablet 40 mg, 40 mg, Oral, Daily, Baxter Hire, MD, 40 mg at 12/31/14 1006 .  potassium chloride SA (K-DUR,KLOR-CON) CR tablet 20 mEq, 20 mEq, Oral, Once,  Tama High III, MD .  prochlorperazine (COMPAZINE) tablet 10 mg, 10 mg, Oral, Q6H PRN, Baxter Hire, MD .  simvastatin (ZOCOR) tablet 20 mg, 20 mg, Oral, QHS, Baxter Hire, MD, 20 mg at 12/31/14 2144 .  sodium chloride 0.9 % injection 3 mL, 3 mL, Intravenous, Q12H, Baxter Hire, MD, 3 mL at 12/31/14 2148 .  sodium chloride 0.9 % injection 3 mL, 3 mL, Intravenous, PRN, Baxter Hire, MD .  valACYclovir Estell Harpin) tablet 500 mg, 500 mg, Oral, BH-q7a, Baxter Hire, MD, 500 mg at 01/01/15  0700  Facility-Administered Medications Ordered in Other Encounters:  .  0.9 %  sodium chloride infusion, , Intravenous, Continuous, Lloyd Huger, MD, Last Rate: 999 mL/hr at 10/29/14 1550  PHYSICAL EXAM:  BP 116/58 mmHg  Pulse 77  Temp(Src) 98.1 F (36.7 C) (Oral)  Resp 25  Ht 5\' 2"  (1.575 m)  Wt 49.397 kg (108 lb 14.4 oz)  BMI 19.91 kg/m2  SpO2 100%  General: pleasant  female, in NAD HEENT: PERRL; conjunctiva pale;OP moist without lesions. Neck: supple, trachea midline, no thyromegaly Chest: normal to palpation Lungs: few crackles without retractions or wheezes Cardiovascular: mechanical heart valve with 3/6 SEM Abdomen: soft, nontender, nondistended, positive bowel sounds Extremities: no clubbing, cyanosis.  No sig edema Neuro: alert, moves all extremities, with generalized weakness Derm: no significant rashes or nodules; decreased skin turgor Lymph: no cervical or supraclavicular lymphadenopathy  Labs and imaging studies were reviewed  ASSESSMENT/PLAN:   1. Pancytopenia, chemotherapy induced/AML- counts stable and improved from prior visit 2. Orthostatic hypotension- may be contributing; reduce BP meds and may need to tolerate a lying BP of up to 646 systolic.   3. mech heart valve- on lovenox long term due to heart valve. recent Echo shows LV preserved; no significant abnormalities in mech valve 4. DM- off standing dose meds due to hypoglycemia; covering with SSI 5. CAD- cont current therapy; avoiding ASA due to lovenox  On sotalol for this and h/o afib.   6. Shaking episode- Prior CT reveals severe LS DDD, which may also be the source.  Bilateral nature with no change in consciousness, as well as prompt improvement with sitting suggests this is the source as well.  Neuro to see for any further thoughts.  Ultimately will need STR to try to improve strength. Remains unclear to me that her functional capacity will improve enough to be further chemotherapy  candidate. 7. Code status- Pt is DNR

## 2015-01-01 NOTE — Progress Notes (Signed)
Inpatient Diabetes Program Recommendations  AACE/ADA: New Consensus Statement on Inpatient Glycemic Control (2013)  Target Ranges:  Prepandial:   less than 140 mg/dL      Peak postprandial:   less than 180 mg/dL (1-2 hours)      Critically ill patients:  140 - 180 mg/dL   Results for [name], [name] (MRN [id number]) as of 01/01/2015 11:44  Ref. Range 12/31/2014 07:29 12/31/2014 11:37 12/31/2014 16:07 12/31/2014 20:37 12/31/2014 21:33 01/01/2015 07:23 01/01/2015 11:01  Glucose-Capillary Latest Ref Range: 65-99 mg/dL 98 138 (H) 88 52 (L) 110 (H) 96 168 (H)    Diabetes history: DM2 Outpatient Diabetes medications: Glipizide 5 mg daily Current orders for Inpatient glycemic control: Glipizide 5 mg daily, Novolog 0-15 units TID with meals, Novolog 0-5 units HS  Inpatient Diabetes Program Recommendations Oral Agents: Glucose noted to be 52 mg/dl on 7/25 at 20:37. While inpatient, may want to consider discontinuing Glipizide 5 mg daily and just use Novolog correction insulin for inpatient glycemic control.  Thanks, Barnie Alderman, RN, MSN, CCRN, CDE Diabetes Coordinator Inpatient Diabetes Program [phone] (Team Pager from [place] to [place]) [phone] ([facility] office) [phone] [facility] office) [phone] [facility] office)

## 2015-01-02 LAB — BASIC METABOLIC PANEL
ANION GAP: 6 (ref 5–15)
BUN: 14 mg/dL (ref 6–20)
CO2: 29 mmol/L (ref 22–32)
Calcium: 9.5 mg/dL (ref 8.9–10.3)
Chloride: 103 mmol/L (ref 101–111)
Creatinine, Ser: 1.02 mg/dL — ABNORMAL HIGH (ref 0.44–1.00)
GFR calc Af Amer: 60 mL/min — ABNORMAL LOW (ref >60)
GFR calc non Af Amer: 52 mL/min — ABNORMAL LOW (ref >60)
Glucose, Bld: 106 mg/dL — ABNORMAL HIGH (ref 65–99)
POTASSIUM: 3.8 mmol/L (ref 3.5–5.1)
SODIUM: 138 mmol/L (ref 135–145)

## 2015-01-02 LAB — MYOGLOBIN, SERUM: Myoglobin: 34 ng/mL (ref 25–58)

## 2015-01-02 LAB — CBC WITH DIFFERENTIAL/PLATELET
BASOS ABS: 0 10*3/uL (ref 0–0.1)
BASOS PCT: 0 %
Eosinophils Absolute: 0.2 10*3/uL (ref 0–0.7)
Eosinophils Relative: 11 %
HEMATOCRIT: 34.3 % — AB (ref 35.0–47.0)
Hemoglobin: 11.5 g/dL — ABNORMAL LOW (ref 12.0–16.0)
Lymphocytes Relative: 38 %
Lymphs Abs: 0.8 10*3/uL — ABNORMAL LOW (ref 1.0–3.6)
MCH: 29.2 pg (ref 26.0–34.0)
MCHC: 33.5 g/dL (ref 32.0–36.0)
MCV: 87.1 fL (ref 80.0–100.0)
Monocytes Absolute: 0 10*3/uL — ABNORMAL LOW (ref 0.2–0.9)
Monocytes Relative: 2 %
NEUTROS ABS: 1 10*3/uL — AB (ref 1.4–6.5)
Neutrophils Relative %: 49 %
Platelets: 186 10*3/uL (ref 150–440)
RBC: 3.94 MIL/uL (ref 3.80–5.20)
RDW: 17.2 % — ABNORMAL HIGH (ref 11.5–14.5)
WBC: 2 10*3/uL — ABNORMAL LOW (ref 3.6–11.0)

## 2015-01-02 LAB — GLUCOSE, CAPILLARY
GLUCOSE-CAPILLARY: 134 mg/dL — AB (ref 65–99)
GLUCOSE-CAPILLARY: 71 mg/dL (ref 65–99)
Glucose-Capillary: 91 mg/dL (ref 65–99)
Glucose-Capillary: 155 mg/dL — ABNORMAL HIGH (ref 65–99)

## 2015-01-02 LAB — VITAMIN B12: VITAMIN B 12: 1228 pg/mL — AB (ref 180–914)

## 2015-01-02 LAB — HIGH SENSITIVITY CRP: CRP HIGH SENSITIVITY: 12.11 mg/L — AB (ref 0.00–3.00)

## 2015-01-02 MED ORDER — LEVOTHYROXINE SODIUM 112 MCG PO TABS
112.0000 ug | ORAL_TABLET | Freq: Every day | ORAL | Status: DC
  Administered 2015-01-02 – 2015-01-03 (×2): 112 ug via ORAL
  Filled 2015-01-02 (×2): qty 1

## 2015-01-02 MED ORDER — LOSARTAN POTASSIUM 50 MG PO TABS
50.0000 mg | ORAL_TABLET | Freq: Every day | ORAL | Status: DC
  Administered 2015-01-02: 50 mg via ORAL
  Filled 2015-01-02 (×2): qty 1

## 2015-01-02 NOTE — Progress Notes (Signed)
Initial Nutrition Assessment  DOCUMENTATION CODES:   Non-severe (moderate) malnutrition in context of chronic illness  INTERVENTION:   1) Medical Food Supplement Therapy: will send SugarFree Carnation Instant Breakfast BID 2) Meals and Snacks: Cater to patient preferences  NUTRITION DIAGNOSIS:   Inadequate oral intake related to cancer and cancer related treatments, acute illness as evidenced by per patient/family report.   GOAL:   Patient will meet greater than or equal to 90% of their needs   MONITOR:    (Energy Intake, Anthropometrics, Electrolyte/Renal Profile, Glucose Profile, Digestive System)  REASON FOR ASSESSMENT:   Malnutrition Screening Tool    ASSESSMENT:    Pt admitted with weakness, chemo induced; pulmonary edema  Past Medical History  Diagnosis Date  . Atrial fibrillation   . Parkinson's disease   . Glaucoma   . Hypertension   . Hypothyroidism   . GERD (gastroesophageal reflux disease)   . High cholesterol   . Diabetes mellitus without complication   . CAD (coronary artery disease) of bypass graft   . History of appendectomy   . H/O: hysterectomy   . Leukemia      Diet Order:  Diet Carb Modified Fluid consistency:: Thin; Room service appropriate?: Yes  Energy Intake: recorded po intake 76% of meals on average  Food/Nutrition Related History: pt reports appetite is up and down, mostly poor appetite over the past month; currently appetite fair  Digestive System: +mild nausea at present, N/V last night; loose BMs but normal per pt  Nutrition-Focused Physical Exam Findings: Nutrition-Focused physical exam completed. Findings are normal fat depletion, mild to moderate muscle depletion, and mild edema.   Electrolyte and Renal Profile:  Recent Labs Lab 12/30/14 1227 01/01/15 0503 01/02/15 0423  BUN 9 14 14   CREATININE 0.91 1.03* 1.02*  NA 137 137 138  K 4.5 3.3* 3.8   Glucose Profile:   Recent Labs  01/01/15 1942 01/02/15 0749  01/02/15 1231  GLUCAP 102* 91 155*   Protein Profile:   Recent Labs Lab 12/30/14 1227 01/01/15 1517  ALBUMIN 3.4* 3.5   Meds:   Height:   Ht Readings from Last 1 Encounters:  12/30/14 5\' 2"  (1.575 m)    Weight: Pt with 4.4% wt loss in past 1-2 weeks; 7.6% wt loss in 1 month.   Wt Readings from Last 1 Encounters:  12/30/14 108 lb 14.4 oz (49.397 kg)   Filed Weights   12/30/14 1205 12/30/14 1631  Weight: 108 lb 3.9 oz (49.1 kg) 108 lb 14.4 oz (49.397 kg)   Wt Readings from Last 10 Encounters:  12/21/14 113 lb 8 oz (51.483 kg)  12/05/14 117 lb 1 oz (53.1 kg)  11/19/14 111 lb 15.9 oz (50.8 kg)  11/06/14 115 lb 1.3 oz (52.2 kg)  10/30/14 117 lb (53.071 kg)  10/16/14 115 lb 1.3 oz (52.2 kg)        Ideal Body Weight:     BMI:  Body mass index is 19.91 kg/(m^2).  Estimated Nutritional Needs:   Kcal:  1232-1478 kcals (BEE 948, 1.3 AF, 1.0-1.2 IF)   Protein:  61-77 g (1.2-1.5 g/kg)   Fluid:  1275-1560 mL (25-30 ml/kg)   MODERATE Care Level  Kerman Passey MS, RD, LDN [phone] Pager

## 2015-01-02 NOTE — Progress Notes (Signed)
NEUROLOGY F/U  S: Feels a little stronger today and ambulated more with PT, no pain  ROS positive for abdominal pain and constipation otherwise neg x 8 systems  O: AFVSS NAD, nl weight Normocephalic, oropharynx clear Supple, no LAD CTA B, no wheezing, RRR, no murmurs  Alert and oriented x 3, nl speech PERRLA, EOMI, face symmetric 5/5 B except 3+/5 B hip flexors and quadriceps  Labs reviewed by me  A/P: 1. Weakness-  Still no clear etiology and could be multifactorial;  Labs did not help 2.  Parkinsons-  Controlled 3.  Peripheral neuropathy-  Stable -  EMG/NCS in 1-2 weeks at [facility] -  No benefit to myelogram due to pt unwilling to go to surgery if it is positive -  Continue therapy -  Continue Sinemet 25/100 QID -  Will sign off, please call with questions -  Needs to f/u with [facility] in 2-3 weeks

## 2015-01-02 NOTE — Progress Notes (Signed)
Physical Therapy Treatment Patient Details Name: [name] MRN: [id number] DOB: 11-01-1935 Today's Date: 01/02/2015    History of Present Illness Patient is a 79 y/o female with past medical history of Parkinson's, AML, A-fib, CHF, CAD, and pacemaker placement. She has just completed a course of chemotherapy and continues to have low WBC. Patient admitted after feeling generally weak and having difficulty ambulating at home.     PT Comments    Pt was willing to comply with therex and stated that she wanted to get up and move around. Pt progressed to modified independence with bed mobility. With sit to stand transfers, she was able to stand up on first attempt with verbal cuing to increase trunk flexion and keep feet close to the bed. Regarding ambulation, pt progressed from step-to to step-through pattern with shortened step length still present and required min assist with a RW (recliner chair behind her with ambulation). Pt reported she needed to stop and LE/UE quivered on her descent to the recliner chair with max assist. Pt demonstrated improved coordination with alternating contralateral hip march with shoulder flexion therex in comparison to previous session.  Pt would benefit from skilled PT in order to normalize gait pattern, increase LE strength and endurance, and improve with sit to stand transfers.      Follow Up Recommendations  SNF     Equipment Recommendations  Rolling walker with 5" wheels    Recommendations for Other Services       Precautions / Restrictions Precautions Precautions: Fall Restrictions Weight Bearing Restrictions: No    Mobility  Bed Mobility Overal bed mobility: Modified Independent Bed Mobility: Supine to Sit           General bed mobility comments: Pt had no difficult with bed mobility today  Transfers Overall transfer level: Needs assistance Equipment used: Rolling walker (2 wheeled) Transfers: Sit to/from Stand Sit to Stand: Min  assist         General transfer comment: Pt was able to stand up on first attempt with verbal cuing to increase trunk flexion and keep feet close to the bed.   Ambulation/Gait Ambulation/Gait assistance: Min assist Ambulation Distance (Feet): 80 Feet Assistive device: Rolling walker (2 wheeled)       General Gait Details: Pt progressed from step-to to step-through pattern with shortened step length and required min assist with a RW. Pt reported she needed to stop and LE/UE quivered on her quick descent to the chair with max assist.    Stairs            Wheelchair Mobility    Modified Rankin (Stroke Patients Only)       Balance Overall balance assessment: Needs assistance Sitting-balance support: Bilateral upper extremity supported;Feet supported Sitting balance-Leahy Scale: Fair     Standing balance support: Bilateral upper extremity supported Standing balance-Leahy Scale: Fair Standing balance comment: Pt able to stand except when LE begin to quiver, then she requires max assist to descend to sitting.                     Cognition Arousal/Alertness: Awake/alert Behavior During Therapy: WFL for tasks assessed/performed;Flat affect Overall Cognitive Status: Within Functional Limits for tasks assessed                      Exercises Other Exercises Other Exercises: Seated bilat hip marches/LAQs/, 2 x 10.  Other Exercises: Seated bilat shoulder flexion-horizontal abduction-abduction progression therex, 1 x 10. Other Exercises:  Alternating contralateral hip march with shoulder flexion therex, 1 x 10.     General Comments        Pertinent Vitals/Pain      Home Living                      Prior Function            PT Goals (current goals can now be found in the care plan section) Acute Rehab PT Goals Patient Stated Goal: To return home safely when appropriate.  PT Goal Formulation: With patient Time For Goal Achievement:  01/14/15 Potential to Achieve Goals: Fair Progress towards PT goals: Progressing toward goals    Frequency  Min 2X/week    PT Plan Current plan remains appropriate    Co-evaluation             End of Session Equipment Utilized During Treatment: Gait belt Activity Tolerance: Patient tolerated treatment well;Patient limited by fatigue Patient left: in chair;with chair alarm set;with call bell/phone within reach     Time: 1130-1153 PT Time Calculation (min) (ACUTE ONLY): 23 min  Charges:                       G Codes:      Bernestine Amass, SPT 01/25/2015 1:10 PM

## 2015-01-02 NOTE — Plan of Care (Signed)
Problem: Discharge Progression Outcomes Goal: Other Discharge Outcomes/Goals Outcome: Progressing Patient transferred to unit today, husband at bedside  VSS No c/o of pain or discomfort  Patient is tolerating diet well Possible discharge to SNF

## 2015-01-02 NOTE — Progress Notes (Signed)
Patient ID: [name], female   DOB: [birth year], 79 y.o.   MRN: [id number] SUBJECTIVE:  Pt with h/o AML, refractory to prior treatment, recently admitted with generalized weakness thought to be due to chemo induced pancytopenia. Has had several episodes of onset of bilateral leg weakness while standing, which improved once she sits down.  Had similar episode of this, which prompted admission.  Neurology has seen; concern over spinal process again raised.  Developed N/V last PM, with some sweating.  Better this AM, though still mildly nauseated.  Bowels slightly loose, but this is baseline for pt.  No fever reported.  FSBS have been controlled; 90 this AM. ______________________________________________________________________  ROS: Please see HPI; remainder of complete 10 point ROS is negative   Past Medical History  Diagnosis Date  . Atrial fibrillation   . Parkinson's disease   . Glaucoma   . Hypertension   . Hypothyroidism   . GERD (gastroesophageal reflux disease)   . High cholesterol   . Diabetes mellitus without complication   . CAD (coronary artery disease) of bypass graft   . History of appendectomy   . H/O: hysterectomy   . Leukemia     Past Surgical History  Procedure Laterality Date  . Cardiac valve replacement    . Pacemaker insertion       Current facility-administered medications:  .  0.9 %  sodium chloride infusion, 250 mL, Intravenous, PRN, Baxter Hire, MD .  acetaminophen (TYLENOL) tablet 650 mg, 650 mg, Oral, Q4H PRN, Baxter Hire, MD, 650 mg at 12/31/14 0831 .  brimonidine (ALPHAGAN) 0.15 % ophthalmic solution 1 drop, 1 drop, Both Eyes, BID, Baxter Hire, MD, 1 drop at 01/01/15 2135 .  carbidopa-levodopa (SINEMET IR) 25-100 MG per tablet immediate release 1 tablet, 1 tablet, Oral, QID, Baxter Hire, MD, 1 tablet at 01/01/15 2135 .  cholecalciferol (VITAMIN D) tablet 1,000 Units, 1,000 Units, Oral, Daily, Baxter Hire, MD, 1,000 Units at  01/01/15 [phone] .  cyanocobalamin tablet 250 mcg, 250 mcg, Oral, Daily, Baxter Hire, MD, 250 mcg at 01/01/15 [phone] .  enoxaparin (LOVENOX) injection 50 mg, 50 mg, Subcutaneous, Q12H, Baxter Hire, MD, 50 mg at 01/01/15 2136 .  ferrous sulfate tablet 325 mg, 325 mg, Oral, Q breakfast, Baxter Hire, MD, 325 mg at 01/01/15 8469 .  furosemide (LASIX) tablet 20 mg, 20 mg, Oral, Daily, Baxter Hire, MD, 20 mg at 01/01/15 0835 .  glipiZIDE (GLUCOTROL XL) 24 hr tablet 5 mg, 5 mg, Oral, Daily, Baxter Hire, MD, 5 mg at 12/31/14 1005 .  insulin aspart (novoLOG) injection 0-15 Units, 0-15 Units, Subcutaneous, TID WC, Baxter Hire, MD, 3 Units at 01/01/15 1707 .  insulin aspart (novoLOG) injection 0-5 Units, 0-5 Units, Subcutaneous, QHS, Baxter Hire, MD, 0 Units at 12/30/14 2109 .  latanoprost (XALATAN) 0.005 % ophthalmic solution 1 drop, 1 drop, Both Eyes, QHS, Baxter Hire, MD, 1 drop at 01/01/15 2135 .  [COMPLETED] levofloxacin (LEVAQUIN) tablet 500 mg, 500 mg, Oral, Once, 500 mg at 12/30/14 1822 **FOLLOWED BY** levofloxacin (LEVAQUIN) tablet 250 mg, 250 mg, Oral, Q24H, Tama High III, MD, 250 mg at 01/01/15 1707 .  levothyroxine (SYNTHROID, LEVOTHROID) tablet 112 mcg, 112 mcg, Oral, QAC breakfast, Tama High III, MD .  losartan (COZAAR) tablet 50 mg, 50 mg, Oral, Daily, Tama High III, MD .  pantoprazole (PROTONIX) EC tablet 40 mg, 40 mg, Oral, Daily, Baxter Hire, MD,  40 mg at 01/01/15 0835 .  prochlorperazine (COMPAZINE) tablet 10 mg, 10 mg, Oral, Q6H PRN, Baxter Hire, MD, 10 mg at 01/01/15 2323 .  sodium chloride 0.9 % injection 3 mL, 3 mL, Intravenous, Q12H, Baxter Hire, MD, 3 mL at 01/01/15 2136 .  sodium chloride 0.9 % injection 3 mL, 3 mL, Intravenous, PRN, Baxter Hire, MD .  valACYclovir Estell Harpin) tablet 500 mg, 500 mg, Oral, BH-q7a, Baxter Hire, MD, 500 mg at 01/02/15 2244  Facility-Administered Medications Ordered in Other Encounters:  .  0.9 %   sodium chloride infusion, , Intravenous, Continuous, Lloyd Huger, MD, Last Rate: 999 mL/hr at 10/29/14 1550  PHYSICAL EXAM:  BP 158/46 mmHg  Pulse 64  Temp(Src) 97.8 F (36.6 C) (Oral)  Resp 18  Ht _0  (1.575 m)  Wt 49.397 kg (108 lb 14.4 oz)  BMI 19.91 kg/m2  SpO2 97%  General: pleasant  female, in NAD HEENT: PERRL; conjunctiva pale;OP moist without lesions. Neck: supple, trachea midline, no thyromegaly Chest: normal to palpation Lungs: few crackles without retractions or wheezes Cardiovascular: mechanical heart valve with 3/6 SEM Abdomen: soft, nontender, nondistended, positive bowel sounds Extremities: no clubbing, cyanosis.  No sig edema Neuro: alert, moves all extremities, with generalized weakness Derm: no significant rashes or nodules Lymph: no cervical or supraclavicular lymphadenopathy  Labs and imaging studies were reviewed  ASSESSMENT/PLAN:   1. Pancytopenia, chemotherapy induced/AML- rechecking CBC; pt at risk for infection and not clear if this is what events of last PM represent.  Urine, blood cultures sent.  Monitor temp 2. Orthostatic hypotension- cont to follow; may need to tolerate a lying SBP of up to 160 to prevent significant orthostatic symptoms with standing   3. mech heart valve- on lovenox long term due to mech heart valve and high bleeding risk. recent Echo showed LV preserved; no significant abnormalities in mech valve 4. DM- off standing dose meds due to hypoglycemia; covering with SSI 5. CAD/afib- cont current therapy; avoiding ASA due to lovenox  cont sotalol    6. Leg weakness.  Prior CT reveals severe LS DDD.  Appreciate neuro input.  Labs reviewed; significance of elevated ESR in pt with AML unclear.   7. Hypothyroid- increased dose 8. D/c plan- anticipate SNF 9. Code status- Pt is DNR

## 2015-01-02 NOTE — Progress Notes (Signed)
Visit made. Mrs. [name] was followed at home by [facility], receiving nursing and PT services. Patient seen sitting up asleep in her recliner, husband [name] at bedside. Per chart review she has had some nausea and continued lower extremity weakness, she is working with PT. Neurology has been consulted. [name] was unclear of discharge plan, per notes patient agreed to SNF for rehab yesterday. [place] notified that [name] would like tol discuss plan as he was unaware his wife had made that decision. Will continue to follow through final disposition. Flo Shanks RN, BSN, [facility] Liaison [phone]

## 2015-01-02 NOTE — Care Management (Addendum)
It was hoped that patient would be able to discharge today to skilled nursing but she is vomiting.  Attending states that with this sx, if patient is discharged she would most likely return for readmission.  Will hold discharge today and anticipate this will occur 7/28

## 2015-01-02 NOTE — Clinical Social Work Note (Signed)
Clinical Social Worker provided bed offers. Pt's husband expressed an interest in [facility] as the facility responded "considering". CSW notified facility via [place] of interest and husband will be able to provide transportation to and from [facility] [employer] treatments. CSW will continue to follow.   Darden Dates, MSW, LCSW Clinical Social Worker  [phone]

## 2015-01-03 LAB — COMPREHENSIVE METABOLIC PANEL
ALBUMIN: 3 g/dL — AB (ref 3.5–5.0)
ALT: <5 U/L — ABNORMAL LOW (ref 14–54)
AST: 9 U/L — AB (ref 15–41)
Alkaline Phosphatase: 62 U/L (ref 38–126)
Anion gap: 5 (ref 5–15)
BUN: 12 mg/dL (ref 6–20)
CALCIUM: 9.6 mg/dL (ref 8.9–10.3)
CO2: 30 mmol/L (ref 22–32)
Chloride: 104 mmol/L (ref 101–111)
Creatinine, Ser: 0.86 mg/dL (ref 0.44–1.00)
GFR calc Af Amer: >60 mL/min (ref >60)
GFR calc non Af Amer: >60 mL/min (ref >60)
Glucose, Bld: 99 mg/dL (ref 65–99)
Potassium: 3.7 mmol/L (ref 3.5–5.1)
SODIUM: 139 mmol/L (ref 135–145)
Total Bilirubin: 0.4 mg/dL (ref 0.3–1.2)
Total Protein: 5.9 g/dL — ABNORMAL LOW (ref 6.5–8.1)

## 2015-01-03 LAB — CBC
HEMATOCRIT: 28.8 % — AB (ref 35.0–47.0)
Hemoglobin: 9.8 g/dL — ABNORMAL LOW (ref 12.0–16.0)
MCH: 29.6 pg (ref 26.0–34.0)
MCHC: 33.9 g/dL (ref 32.0–36.0)
MCV: 87.2 fL (ref 80.0–100.0)
Platelets: 143 10*3/uL — ABNORMAL LOW (ref 150–440)
RBC: 3.3 MIL/uL — ABNORMAL LOW (ref 3.80–5.20)
RDW: 17.7 % — ABNORMAL HIGH (ref 11.5–14.5)
WBC: 1.7 10*3/uL — ABNORMAL LOW (ref 3.6–11.0)

## 2015-01-03 LAB — GLUCOSE, CAPILLARY
GLUCOSE-CAPILLARY: 169 mg/dL — AB (ref 65–99)
Glucose-Capillary: 100 mg/dL — ABNORMAL HIGH (ref 65–99)

## 2015-01-03 MED ORDER — LEVOTHYROXINE SODIUM 112 MCG PO TABS: 112.0000 ug | ORAL_TABLET | Freq: Every day | ORAL | Status: DC

## 2015-01-03 MED ORDER — LOSARTAN POTASSIUM 50 MG PO TABS: 50.0000 mg | ORAL_TABLET | Freq: Every day | ORAL | Status: DC

## 2015-01-03 MED ORDER — INSULIN ASPART 100 UNIT/ML ~~LOC~~ SOLN: 0.0000 [IU] | Freq: Three times a day (TID) | SUBCUTANEOUS | Status: DC

## 2015-01-03 NOTE — Progress Notes (Signed)
Patient ID: [name], female   DOB: Aug 19, 1935, 79 y.o.   MRN: [id number] SUBJECTIVE:  Pt with h/o AML, refractory to prior treatment, recently admitted with generalized weakness thought to be due to chemo induced pancytopenia. Has had several episodes of onset of bilateral leg weakness while standing, which improved once she sits down.  Had similar episode of this, which prompted admission. No new problems overnight; eating improved and bowel function better. Still getting weak periodically with walking, but can get relief by sitting, as noted above ______________________________________________________________________  ROS: Please see HPI; remainder of complete 10 point ROS is negative   Past Medical History  Diagnosis Date  . Atrial fibrillation   . Parkinson's disease   . Glaucoma   . Hypertension   . Hypothyroidism   . GERD (gastroesophageal reflux disease)   . High cholesterol   . Diabetes mellitus without complication   . CAD (coronary artery disease) of bypass graft   . History of appendectomy   . H/O: hysterectomy   . Leukemia     Past Surgical History  Procedure Laterality Date  . Cardiac valve replacement    . Pacemaker insertion       Current facility-administered medications:  .  0.9 %  sodium chloride infusion, 250 mL, Intravenous, PRN, Baxter Hire, MD .  acetaminophen (TYLENOL) tablet 650 mg, 650 mg, Oral, Q4H PRN, Baxter Hire, MD, 650 mg at 12/31/14 0831 .  brimonidine (ALPHAGAN) 0.15 % ophthalmic solution 1 drop, 1 drop, Both Eyes, BID, Baxter Hire, MD, 1 drop at 01/02/15 2226 .  carbidopa-levodopa (SINEMET IR) 25-100 MG per tablet immediate release 1 tablet, 1 tablet, Oral, QID, Baxter Hire, MD, 1 tablet at 01/02/15 2229 .  cholecalciferol (VITAMIN D) tablet 1,000 Units, 1,000 Units, Oral, Daily, Baxter Hire, MD, 1,000 Units at 01/02/15 0901 .  cyanocobalamin tablet 250 mcg, 250 mcg, Oral, Daily, Baxter Hire, MD, 250 mcg at  01/02/15 0901 .  enoxaparin (LOVENOX) injection 50 mg, 50 mg, Subcutaneous, Q12H, Baxter Hire, MD, 50 mg at 01/02/15 2225 .  ferrous sulfate tablet 325 mg, 325 mg, Oral, Q breakfast, Baxter Hire, MD, 325 mg at 01/02/15 0902 .  furosemide (LASIX) tablet 20 mg, 20 mg, Oral, Daily, Baxter Hire, MD, 20 mg at 01/02/15 0901 .  insulin aspart (novoLOG) injection 0-15 Units, 0-15 Units, Subcutaneous, TID WC, Baxter Hire, MD, 2 Units at 01/02/15 1754 .  insulin aspart (novoLOG) injection 0-5 Units, 0-5 Units, Subcutaneous, QHS, Baxter Hire, MD, 0 Units at 12/30/14 2109 .  latanoprost (XALATAN) 0.005 % ophthalmic solution 1 drop, 1 drop, Both Eyes, QHS, Baxter Hire, MD, 1 drop at 01/02/15 2227 .  [COMPLETED] levofloxacin (LEVAQUIN) tablet 500 mg, 500 mg, Oral, Once, 500 mg at 12/30/14 1822 **FOLLOWED BY** levofloxacin (LEVAQUIN) tablet 250 mg, 250 mg, Oral, Q24H, Tama High III, MD, 250 mg at 01/02/15 1754 .  levothyroxine (SYNTHROID, LEVOTHROID) tablet 112 mcg, 112 mcg, Oral, QAC breakfast, Tama High III, MD, 112 mcg at 01/02/15 0902 .  losartan (COZAAR) tablet 50 mg, 50 mg, Oral, Daily, Tama High III, MD, 50 mg at 01/02/15 0901 .  pantoprazole (PROTONIX) EC tablet 40 mg, 40 mg, Oral, Daily, Baxter Hire, MD, 40 mg at 01/02/15 0901 .  prochlorperazine (COMPAZINE) tablet 10 mg, 10 mg, Oral, Q6H PRN, Baxter Hire, MD, 10 mg at 01/01/15 2323 .  sodium chloride 0.9 % injection 3 mL, 3  mL, Intravenous, Q12H, Baxter Hire, MD, 3 mL at 01/02/15 2229 .  sodium chloride 0.9 % injection 3 mL, 3 mL, Intravenous, PRN, Baxter Hire, MD .  valACYclovir Estell Harpin) tablet 500 mg, 500 mg, Oral, BH-q7a, Baxter Hire, MD, 500 mg at 01/02/15 3220  Facility-Administered Medications Ordered in Other Encounters:  .  0.9 %  sodium chloride infusion, , Intravenous, Continuous, Lloyd Huger, MD, Last Rate: 999 mL/hr at 10/29/14 1550  PHYSICAL EXAM:  BP 129/72 mmHg  Pulse 61   Temp(Src) 98.4 F (36.9 C) (Oral)  Resp 18  Ht 5\' 2"  (1.575 m)  Wt 49.397 kg (108 lb 14.4 oz)  BMI 19.91 kg/m2  SpO2 99%  General: pleasant  female, in NAD HEENT: PERRL; conjunctiva pale;OP moist without lesions. Neck: supple, trachea midline, no thyromegaly Chest: normal to palpation Lungs: few crackles without retractions or wheezes Cardiovascular: mechanical heart valve with 3/6 SEM Abdomen: soft, nontender, nondistended, positive bowel sounds Extremities: no clubbing, cyanosis.  No sig edema Neuro: alert, moves all extremities, with generalized weakness Derm: no significant rashes or nodules Lymph: no cervical or supraclavicular lymphadenopathy  Labs and imaging studies were reviewed  ASSESSMENT/PLAN:   1. Pancytopenia, chemotherapy induced/AML- counts stable. Recent nausea resolved. Urine, blood cultures sent.   2. Orthostatic hypotension- improved; may need to tolerate a lying SBP of up to 160 to prevent significant orthostatic symptoms with standing   3. mech heart valve- on lovenox long term due to mech heart valve and high bleeding risk. recent Echo showed LV preserved; no significant abnormalities in mech valve 4. DM- off standing dose meds due to hypoglycemia and poor oral intake; covering with SSI 5. CAD/afib- cont current therapy; avoiding ASA due to lovenox. Will cont sotalol    6. Leg weakness. Appreciate neuro input. May be from her back; she is not interested in aggressive measures, and so further imaging deferred  7. Hypothyroid- increased dose based on last TSH 8. Severe protein/calorie malnutrition- cont to encourage PO intake 9. D/c plan- anticipate SNF when bed available 10. Code status- Pt is DNR

## 2015-01-03 NOTE — Plan of Care (Signed)
Problem: Discharge Progression Outcomes Goal: Discharge plan in place and appropriate Individualization of care - Likes to be called [name]. - Lives at [facility]. - Assist to [facility]. - Has history of a-fib, hypertension [place], and leukemia, controlled by medications. - On high fall precautions per policy, offer toileting during hourly rounds.

## 2015-01-03 NOTE — Progress Notes (Signed)
Patient discharged via EMS to [facility], report called to [place].  IV removed and Husband at bedside.  All goals and education met.

## 2015-01-03 NOTE — Care Management Important Message (Signed)
Important Message  Patient Details  Name: [name] MRN: [id number] Date of Birth: [birth year]   Medicare Important Message Given:  Yes-third notification given    [name] 01/03/2015, 9:35 AM

## 2015-01-03 NOTE — Progress Notes (Signed)
All goals met, Patient discharging today via EMS to [facility],  Patient with a history of being orthostatic, MD aware of low blood pressure which has improved throughout the day.  Patient alert and oriented.  Awaiting bed assignment to discharge, family at bedside.

## 2015-01-03 NOTE — Clinical Social Work Note (Signed)
Pt is ready for discharge today and will go to [facility]. Pt and family are agreeable to discharge plan. RN to call report and EMS will provide transportation. CSW is signing off as no further needs identified.   Darden Dates, MSW, LCSW Clinical Social Worker  [phone]

## 2015-01-03 NOTE — Plan of Care (Signed)
Problem: Discharge Progression Outcomes Goal: Other Discharge Outcomes/Goals Plan of care progress to goal for: Pulmonary edema - No complaints of pain. - VSS. - One assist to [facility]. - Continues on neutropenic precautions per policy.

## 2015-01-03 NOTE — Clinical Social Work Placement (Signed)
   CLINICAL SOCIAL WORK PLACEMENT  NOTE  Date:  01/03/2015  Patient Details  Name: [name] MRN: [id number] Date of Birth: 1935-08-11  Clinical Social Work is seeking post-discharge placement for this patient at the [place] level of care (*CSW will initial, date and re-position this form in  chart as items are completed):  Yes   Patient/family provided with [place] Work Department's list of facilities offering this level of care within the geographic area requested by the patient (or if unable, by the patient's family).  Yes   Patient/family informed of their freedom to choose among providers that offer the needed level of care, that participate in Medicare, Medicaid or managed care program needed by the patient, have an available bed and are willing to accept the patient.  Yes   Patient/family informed of [facility]'s ownership interest in [facility] and [facility], as well as of the fact that they are under no obligation to receive care at these facilities.  PASRR submitted to EDS on       PASRR number received on       Existing PASRR number confirmed on 01/01/15     FL2 transmitted to all facilities in geographic area requested by pt/family on 01/01/15     FL2 transmitted to all facilities within larger geographic area on       Patient informed that his/her managed care company has contracts with or will negotiate with certain facilities, including the following:   (pt was given a packet with this information on it)     Yes   Patient/family informed of bed offers received.  Patient chooses bed at  [facility])     Physician recommends and patient chooses bed at  [facility])    Patient to be transferred to  [facility]) on 01/03/15.  Patient to be transferred to facility by  [facility])     Patient family notified on 01/03/15 of transfer.  Name of family member notified:   (Pt and husband)      PHYSICIAN       Additional Comment:    _______________________________________________ Darden Dates, LCSW 01/03/2015, 3:02 PM

## 2015-01-03 NOTE — Discharge Summary (Signed)
Physician Discharge Summary  Patient ID: [name] MRN: [id number] DOB/AGE: [birth year] 77 y.o.  Admit date: 12/30/2014 Discharge date: 01/03/2015  Admission Diagnoses:  Discharge Diagnoses:  Principal Problem:   Chronic diastolic congestive heart failure Active Problems:   Atrial fibrillation   Mechanical heart valve present   Coronary artery disease   Essential hypertension   Antineoplastic chemotherapy induced pancytopenia   AML (acute myeloblastic leukemia)   Weakness   Malnutrition of moderate degree   Pulmonary edema Lumbar DDD  Discharged Condition: stable  Hospital Course: admitted with recurrent episodes of weakness, felt to be multifactorial. Pt has pancytopenia due to recent chemotherapy for AML, with last bone marrow biopsy with residual disease and chemotherapy held due to declining condition.  Followed by Dr. Alyssa Grove.  Has h/o chronic left side diastolic CHF; appeared to have some fluid overload, though similar to baseline.  Gently diuresed and continued on losartan, though dose decreased due to hypotension.    Pt with recurrent episodes of bilateral leg weakness/legs trembling. This has occurred intermittently but appeared to be happening more frequently, and seemed to be one of these episodes that also led to admission.  Has severe DDD by prior imaging.  Was evaluated by neurology, with no clear other source.  Was felt that she may benefit from STR to improve strength, which may improve these episodes.    HR remained controlled; has been on longterm lovenox for afib and her mechanical heart valve, as she has been felt too high risk of bleeding to put on oral anticoagulants.  She had no chest pain or other evidence of progressive CAD.  Has been taken off statins out of concern that this may contribute to her weakness.  No ASA due to lovenox.  Oral intake continues to be a challenge; dietary has been working with pt.  Her diabetes has been controlled, and she  was taken off glucotrol due to hypoglycemia.  Continues on sliding scale coverage  Palliative care has seen pt multiple times, and several discussions have been had regarding transition to Hospice.  At this point she wishes to try to continue with chemotherapy when strong enough. She is DNR  She should follow up with Dr. Grayland Ormond in 2 wks.  PT and OT should evaluate and treat the pt.  Diet should be regular, due to poor oral intake.  If it improves, convert to no added salt.  Patient should weigh daily, calling for more than 2 lb gain in 1 day or 5 lbs in 1 week, or any worsening signs/symptoms of CHF.   Consults: cardiology, oncology, neurology  Discharge Exam: Blood pressure 129/72, pulse 61, temperature 98.4 F (36.9 C), temperature source Oral, resp. rate 18, height _0  (1.575 m), weight 49.397 kg (108 lb 14.4 oz), SpO2 99 %.   Disposition: SNF      Discharge Instructions    Diet - low sodium heart healthy    Complete by:  As directed      Increase activity slowly    Complete by:  As directed   Up with walker with assistance as tolerated            Medication List    STOP taking these medications        glipiZIDE 5 MG 24 hr tablet  Commonly known as:  GLUCOTROL XL     levofloxacin 500 MG tablet  Commonly known as:  LEVAQUIN     simvastatin 20 MG tablet  Commonly known as:  ZOCOR      TAKE these medications        acetaminophen 325 MG tablet  Commonly known as:  TYLENOL  Take 650 mg by mouth every 4 (four) hours as needed for mild pain or headache.     brimonidine 0.15 % ophthalmic solution  Commonly known as:  ALPHAGAN  Place 1 drop into both eyes 2 (two) times daily.     carbidopa-levodopa 25-100 MG per tablet  Commonly known as:  SINEMET IR  Take 1 tablet by mouth 4 (four) times daily.     cholecalciferol 1000 UNITS tablet  Commonly known as:  VITAMIN D  Take 1,000 Units by mouth daily.     enoxaparin 150 MG/ML injection  Commonly known as:  LOVENOX   Inject 0.33 mLs (50 mg total) into the skin every 12 (twelve) hours.     ferrous sulfate 325 (65 FE) MG tablet  Take 325 mg by mouth daily with breakfast.     furosemide 40 MG tablet  Commonly known as:  LASIX  Take 20 mg by mouth daily.     insulin aspart 100 UNIT/ML injection  Commonly known as:  novoLOG  Inject 0-15 Units into the skin 3 (three) times daily with meals.     latanoprost 0.005 % ophthalmic solution  Commonly known as:  XALATAN  Place 1 drop into both eyes at bedtime.     levothyroxine 112 MCG tablet  Commonly known as:  SYNTHROID, LEVOTHROID  Take 1 tablet (112 mcg total) by mouth daily before breakfast.     losartan 50 MG tablet  Commonly known as:  COZAAR  Take 1 tablet (50 mg total) by mouth daily.     omeprazole 40 MG capsule  Commonly known as:  PRILOSEC  Take 40 mg by mouth 2 (two) times daily.     prochlorperazine 10 MG tablet  Commonly known as:  COMPAZINE  Take 1 tablet (10 mg total) by mouth every 6 (six) hours as needed (Nausea or vomiting).     valACYclovir 500 MG tablet  Commonly known as:  VALTREX  Take 500 mg by mouth every morning.     vitamin B-12 250 MCG tablet  Commonly known as:  CYANOCOBALAMIN  Take 250 mcg by mouth daily.         Signed: Tama High III 01/03/2015, 8:07 AM

## 2015-01-04 LAB — URINE CULTURE

## 2015-01-07 ENCOUNTER — Ambulatory Visit: Payer: Medicare Other

## 2015-01-07 LAB — CULTURE, BLOOD (ROUTINE X 2)
CULTURE: NO GROWTH
Culture: NO GROWTH

## 2015-01-14 ENCOUNTER — Ambulatory Visit: Payer: Medicare Other

## 2015-01-14 ENCOUNTER — Inpatient Hospital Stay: Payer: Medicare Other | Attending: Oncology

## 2015-01-14 ENCOUNTER — Inpatient Hospital Stay (HOSPITAL_BASED_OUTPATIENT_CLINIC_OR_DEPARTMENT_OTHER): Payer: Medicare Other | Admitting: Oncology

## 2015-01-14 ENCOUNTER — Ambulatory Visit: Payer: Medicare Other | Admitting: Oncology

## 2015-01-14 ENCOUNTER — Other Ambulatory Visit: Payer: Medicare Other

## 2015-01-14 VITALS — BP 131/78 | HR 96 | Temp 97.9°F | Resp 16 | Wt 105.8 lb

## 2015-01-14 DIAGNOSIS — D61818 Other pancytopenia: Secondary | ICD-10-CM | POA: Insufficient documentation

## 2015-01-14 DIAGNOSIS — G20 Parkinson's disease: Secondary | ICD-10-CM | POA: Diagnosis not present

## 2015-01-14 DIAGNOSIS — C9200 Acute myeloblastic leukemia, not having achieved remission: Secondary | ICD-10-CM | POA: Insufficient documentation

## 2015-01-14 DIAGNOSIS — R531 Weakness: Secondary | ICD-10-CM

## 2015-01-14 DIAGNOSIS — Z87891 Personal history of nicotine dependence: Secondary | ICD-10-CM | POA: Diagnosis not present

## 2015-01-14 DIAGNOSIS — Z951 Presence of aortocoronary bypass graft: Secondary | ICD-10-CM | POA: Insufficient documentation

## 2015-01-14 DIAGNOSIS — I2510 Atherosclerotic heart disease of native coronary artery without angina pectoris: Secondary | ICD-10-CM | POA: Diagnosis not present

## 2015-01-14 DIAGNOSIS — E039 Hypothyroidism, unspecified: Secondary | ICD-10-CM | POA: Diagnosis not present

## 2015-01-14 DIAGNOSIS — I4891 Unspecified atrial fibrillation: Secondary | ICD-10-CM | POA: Diagnosis not present

## 2015-01-14 DIAGNOSIS — R5383 Other fatigue: Secondary | ICD-10-CM | POA: Insufficient documentation

## 2015-01-14 DIAGNOSIS — I10 Essential (primary) hypertension: Secondary | ICD-10-CM | POA: Insufficient documentation

## 2015-01-14 DIAGNOSIS — Z79899 Other long term (current) drug therapy: Secondary | ICD-10-CM | POA: Diagnosis not present

## 2015-01-14 DIAGNOSIS — E119 Type 2 diabetes mellitus without complications: Secondary | ICD-10-CM

## 2015-01-14 DIAGNOSIS — G620 Drug-induced polyneuropathy: Secondary | ICD-10-CM

## 2015-01-14 DIAGNOSIS — R5381 Other malaise: Secondary | ICD-10-CM

## 2015-01-14 LAB — BASIC METABOLIC PANEL
Anion gap: 3 — ABNORMAL LOW (ref 5–15)
BUN: 15 mg/dL (ref 6–20)
CALCIUM: 9.5 mg/dL (ref 8.9–10.3)
CHLORIDE: 105 mmol/L (ref 101–111)
CO2: 29 mmol/L (ref 22–32)
CREATININE: 1.02 mg/dL — AB (ref 0.44–1.00)
GFR calc Af Amer: 60 mL/min — ABNORMAL LOW (ref >60)
GFR, EST NON AFRICAN AMERICAN: 52 mL/min — AB (ref >60)
GLUCOSE: 129 mg/dL — AB (ref 65–99)
POTASSIUM: 3.9 mmol/L (ref 3.5–5.1)
Sodium: 137 mmol/L (ref 135–145)

## 2015-01-14 LAB — CBC WITH DIFFERENTIAL/PLATELET
BASOS PCT: 0 %
Basophils Absolute: 0 10*3/uL (ref 0–0.1)
EOS PCT: 4 %
Eosinophils Absolute: 0.1 10*3/uL (ref 0–0.7)
HEMATOCRIT: 31.6 % — AB (ref 35.0–47.0)
Hemoglobin: 10.5 g/dL — ABNORMAL LOW (ref 12.0–16.0)
LYMPHS PCT: 29 %
Lymphs Abs: 0.6 10*3/uL — ABNORMAL LOW (ref 1.0–3.6)
MCH: 29.4 pg (ref 26.0–34.0)
MCHC: 33.2 g/dL (ref 32.0–36.0)
MCV: 88.5 fL (ref 80.0–100.0)
MONO ABS: 0.1 10*3/uL — AB (ref 0.2–0.9)
Monocytes Relative: 3 %
NEUTROS PCT: 64 %
Neutro Abs: 1.3 10*3/uL — ABNORMAL LOW (ref 1.4–6.5)
PLATELETS: 134 10*3/uL — AB (ref 150–440)
RBC: 3.56 MIL/uL — ABNORMAL LOW (ref 3.80–5.20)
RDW: 18.6 % — AB (ref 11.5–14.5)
WBC: 2 10*3/uL — ABNORMAL LOW (ref 3.6–11.0)

## 2015-01-14 LAB — SAMPLE TO BLOOD BANK

## 2015-01-14 NOTE — Progress Notes (Signed)
[facility]  [phone] [phone]  ID: KATALENA MALVEAUX OB: [birth year]  MR#: [id number]  AXE#:[id number]  Patient Care Team: Adin Hector, MD as PCP - General (Internal Medicine)  CHIEF COMPLAINT:  Chief Complaint  Patient presents with  . Follow-up    AML    INTERVAL HISTORY: Patient returns to clinic today for further evaluation and treatment planning for her next infusion of Vidaza. She continues to feel weak and fatigued, but she states she is improving with dedicated physical therapy at her skilled nursing facility. She denies any fevers. She has no neurologic complaints. She has a fair appetite. She denies any chest pain or shortness of breath. She has no nausea, vomiting, constipation, or diarrhea. Patient offers no further specific complaints.  REVIEW OF SYSTEMS:   Review of Systems  Constitutional: Positive for malaise/fatigue. Negative for fever.  Respiratory: Negative.   Cardiovascular: Negative.   Neurological: Positive for weakness.    As per HPI. Otherwise, a complete review of systems is negatve.  PAST MEDICAL HISTORY: Past Medical History  Diagnosis Date  . Atrial fibrillation   . Parkinson's disease   . Glaucoma   . Hypertension   . Hypothyroidism   . GERD (gastroesophageal reflux disease)   . High cholesterol   . Diabetes mellitus without complication   . CAD (coronary artery disease) of bypass graft   . History of appendectomy   . H/O: hysterectomy   . Leukemia     PAST SURGICAL HISTORY: Past Surgical History  Procedure Laterality Date  . Cardiac valve replacement    . Pacemaker insertion      FAMILY HISTORY Family History  Problem Relation Age of Onset  . CAD Other   . Heart disease Father        ADVANCED DIRECTIVES:    HEALTH MAINTENANCE: History  Substance Use Topics  . Smoking status: Former Smoker -- 1.00 packs/day for 10 years    Types: Cigarettes  . Smokeless tobacco: Not on file    . Alcohol Use: No     Colonoscopy:  PAP:  Bone density:  Lipid panel:  Allergies  Allergen Reactions  . Hydrochlorothiazide Other (See Comments)    Worsening hypercalcemia Worsening hypercalcemia Reaction:  Unknown   . Lisinopril Cough and Other (See Comments)    Worsening renal insufficiency at 69m dose Other reaction(s): Other (See Comments) Worsening renal insufficiency at 433mdose    Current Outpatient Prescriptions  Medication Sig Dispense Refill  . acetaminophen (TYLENOL) 325 MG tablet Take 650 mg by mouth every 4 (four) hours as needed for mild pain or headache.    . brimonidine (ALPHAGAN) 0.15 % ophthalmic solution Place 1 drop into both eyes 2 (two) times daily.    . carbidopa-levodopa (SINEMET IR) 25-100 MG per tablet Take 1 tablet by mouth 4 (four) times daily. 120 tablet 11  . cholecalciferol (VITAMIN D) 1000 UNITS tablet Take 1,000 Units by mouth daily.    . Marland Kitchennoxaparin (LOVENOX) 150 MG/ML injection Inject 0.33 mLs (50 mg total) into the skin every 12 (twelve) hours. 60 Syringe 11  . ferrous sulfate 325 (65 FE) MG tablet Take 325 mg by mouth daily with breakfast.    . furosemide (LASIX) 40 MG tablet Take 20 mg by mouth daily.    . insulin aspart (NOVOLOG) 100 UNIT/ML injection Inject 0-15 Units into the skin 3 (three) times daily with meals. 10 mL 11  . latanoprost (XALATAN) 0.005 % ophthalmic solution Place 1 drop  into both eyes at bedtime.    Marland Kitchen levothyroxine (SYNTHROID, LEVOTHROID) 112 MCG tablet Take 1 tablet (112 mcg total) by mouth daily before breakfast. 30 tablet 11  . losartan (COZAAR) 50 MG tablet Take 1 tablet (50 mg total) by mouth daily. 30 tablet 11  . omeprazole (PRILOSEC) 40 MG capsule Take 40 mg by mouth 2 (two) times daily.    . prochlorperazine (COMPAZINE) 10 MG tablet Take 1 tablet (10 mg total) by mouth every 6 (six) hours as needed (Nausea or vomiting). 30 tablet 1  . valACYclovir (VALTREX) 500 MG tablet Take 500 mg by mouth every morning.      . vitamin B-12 (CYANOCOBALAMIN) 250 MCG tablet Take 250 mcg by mouth daily.     No current facility-administered medications for this visit.   Facility-Administered Medications Ordered in Other Visits  Medication Dose Route Frequency Provider Last Rate Last Dose  . 0.9 %  sodium chloride infusion   Intravenous Continuous Lloyd Huger, MD 999 mL/hr at 10/29/14 1550      OBJECTIVE: Filed Vitals:   01/14/15 1511  BP: 131/78  Pulse: 96  Temp: 97.9 F (36.6 C)  Resp: 16     Body mass index is 19.35 kg/(m^2).    ECOG FS:2 - Symptomatic, <50% confined to bed  General: Well-developed, well-nourished, no acute distress. Eyes: anicteric sclera. Lungs: Clear to auscultation bilaterally. Heart: Regular rate and rhythm. No rubs, murmurs, or gallops. Abdomen: Soft, nontender, nondistended. No organomegaly noted, normoactive bowel sounds. Musculoskeletal: No edema, cyanosis, or clubbing. Neuro: Alert, answering all questions appropriately. Cranial nerves grossly intact. Skin: No rashes or petechiae noted. Psych: Normal affect.   LAB RESULTS:  Lab Results  Component Value Date   NA 137 01/14/2015   K 3.9 01/14/2015   CL 105 01/14/2015   CO2 29 01/14/2015   GLUCOSE 129* 01/14/2015   BUN 15 01/14/2015   CREATININE 1.02* 01/14/2015   CALCIUM 9.5 01/14/2015   PROT 5.9* 01/03/2015   ALBUMIN 3.0* 01/03/2015   AST 9* 01/03/2015   ALT <5* 01/03/2015   ALKPHOS 62 01/03/2015   BILITOT 0.4 01/03/2015   GFRNONAA 52* 01/14/2015   GFRAA 60* 01/14/2015    Lab Results  Component Value Date   WBC 2.0* 01/14/2015   NEUTROABS 1.3* 01/14/2015   HGB 10.5* 01/14/2015   HCT 31.6* 01/14/2015   MCV 88.5 01/14/2015   PLT 134* 01/14/2015     STUDIES: Dg Chest 1 View  12/30/2014   CLINICAL DATA:  Increasing weakness over the past couple of weeks.  EXAM: CHEST  1 VIEW  COMPARISON:  Single view of the chest 10/30/2014. PA and lateral chest 06/11/2014.  FINDINGS: Dual lead pacing device is  in place in the patient is status post CABG. There is cardiomegaly and pulmonary vascular congestion, unchanged in appearance. No consolidative process, pneumothorax or effusion.  IMPRESSION: Cardiomegaly and pulmonary vascular congestion.  No acute finding.   Electronically Signed   By: Inge Rise M.D.   On: 12/30/2014 13:02    ASSESSMENT: AML  PLAN:    1. AML: Hospice and comfort care were again discussed with the patient today. She expressed understanding that this eventually may be her only option, but wishes to delay making a final decision at this time. Her most recent bone marrow biopsy was improved, but still contained AML at approximately 19% of her sample. She has now completed 4 cycles of azacitidine 75 mg/m IV. She will receive this treatment on days 1 through  5 as well as on day 8 and 9. Return to clinic in 7-10 days with repeat laboratory work and further discussion on whether or not to continue treatment or not.  2. Pancytopenia: Improving.  Secondary to AML as well as chemotherapy. Monitor.  Patient expressed understanding and was in agreement with this plan. She also understands that She can call clinic at any time with any questions, concerns, or complaints.   Lloyd Huger, MD   01/14/2015 4:29 PM

## 2015-01-14 NOTE — Progress Notes (Signed)
Patient is staying at [facility] and receiving PT.

## 2015-01-15 ENCOUNTER — Other Ambulatory Visit: Payer: Medicare Other

## 2015-01-15 ENCOUNTER — Ambulatory Visit: Payer: Medicare Other | Admitting: Oncology

## 2015-01-15 ENCOUNTER — Ambulatory Visit: Payer: Medicare Other

## 2015-01-18 ENCOUNTER — Telehealth: Payer: Self-pay | Admitting: *Deleted

## 2015-01-18 NOTE — Telephone Encounter (Signed)
Being discharged from [facility] and requesting an order for PT, Please sign faxed order or call a verbal ok to them

## 2015-01-21 ENCOUNTER — Other Ambulatory Visit: Payer: Medicare Other

## 2015-01-21 ENCOUNTER — Ambulatory Visit: Payer: Medicare Other

## 2015-01-21 ENCOUNTER — Ambulatory Visit: Payer: Medicare Other | Admitting: Oncology

## 2015-01-21 NOTE — Telephone Encounter (Signed)
Verbal order given to Ivin Booty at [facility] for physical therapy at home.

## 2015-01-23 ENCOUNTER — Inpatient Hospital Stay (HOSPITAL_BASED_OUTPATIENT_CLINIC_OR_DEPARTMENT_OTHER): Payer: Medicare Other | Admitting: Oncology

## 2015-01-23 ENCOUNTER — Inpatient Hospital Stay: Payer: Medicare Other

## 2015-01-23 VITALS — BP 97/62 | HR 106 | Temp 96.1°F | Resp 16

## 2015-01-23 DIAGNOSIS — C9200 Acute myeloblastic leukemia, not having achieved remission: Secondary | ICD-10-CM

## 2015-01-23 DIAGNOSIS — R5383 Other fatigue: Secondary | ICD-10-CM

## 2015-01-23 DIAGNOSIS — G620 Drug-induced polyneuropathy: Secondary | ICD-10-CM

## 2015-01-23 DIAGNOSIS — R531 Weakness: Secondary | ICD-10-CM | POA: Diagnosis not present

## 2015-01-23 DIAGNOSIS — R5381 Other malaise: Secondary | ICD-10-CM

## 2015-01-23 DIAGNOSIS — E039 Hypothyroidism, unspecified: Secondary | ICD-10-CM

## 2015-01-23 DIAGNOSIS — D61818 Other pancytopenia: Secondary | ICD-10-CM | POA: Diagnosis not present

## 2015-01-23 DIAGNOSIS — Z951 Presence of aortocoronary bypass graft: Secondary | ICD-10-CM

## 2015-01-23 DIAGNOSIS — I2510 Atherosclerotic heart disease of native coronary artery without angina pectoris: Secondary | ICD-10-CM

## 2015-01-23 DIAGNOSIS — I10 Essential (primary) hypertension: Secondary | ICD-10-CM

## 2015-01-23 DIAGNOSIS — I4891 Unspecified atrial fibrillation: Secondary | ICD-10-CM

## 2015-01-23 DIAGNOSIS — E119 Type 2 diabetes mellitus without complications: Secondary | ICD-10-CM

## 2015-01-23 LAB — BASIC METABOLIC PANEL
Anion gap: 4 — ABNORMAL LOW (ref 5–15)
BUN: 20 mg/dL (ref 6–20)
CALCIUM: 9 mg/dL (ref 8.9–10.3)
CO2: 26 mmol/L (ref 22–32)
Chloride: 107 mmol/L (ref 101–111)
Creatinine, Ser: 1.45 mg/dL — ABNORMAL HIGH (ref 0.44–1.00)
GFR calc Af Amer: 39 mL/min — ABNORMAL LOW (ref >60)
GFR, EST NON AFRICAN AMERICAN: 34 mL/min — AB (ref >60)
Glucose, Bld: 131 mg/dL — ABNORMAL HIGH (ref 65–99)
POTASSIUM: 4.1 mmol/L (ref 3.5–5.1)
SODIUM: 137 mmol/L (ref 135–145)

## 2015-01-23 LAB — CBC WITH DIFFERENTIAL/PLATELET
Basophils Absolute: 0 10*3/uL (ref 0–0.1)
Basophils Relative: 0 %
EOS ABS: 0.1 10*3/uL (ref 0–0.7)
HCT: 25.1 % — ABNORMAL LOW (ref 35.0–47.0)
Hemoglobin: 8.4 g/dL — ABNORMAL LOW (ref 12.0–16.0)
LYMPHS ABS: 0.4 10*3/uL — AB (ref 1.0–3.6)
Lymphocytes Relative: 34 %
MCH: 30.1 pg (ref 26.0–34.0)
MCHC: 33.4 g/dL (ref 32.0–36.0)
MCV: 90.2 fL (ref 80.0–100.0)
MONO ABS: 0 10*3/uL — AB (ref 0.2–0.9)
Monocytes Relative: 3 %
Neutro Abs: 0.7 10*3/uL — ABNORMAL LOW (ref 1.4–6.5)
Neutrophils Relative %: 58 %
PLATELETS: 77 10*3/uL — AB (ref 150–440)
RBC: 2.79 MIL/uL — AB (ref 3.80–5.20)
RDW: 21.4 % — AB (ref 11.5–14.5)
WBC: 1.3 10*3/uL — AB (ref 3.6–11.0)

## 2015-01-23 NOTE — Progress Notes (Signed)
Patient is now at home and receiving [facility].

## 2015-01-24 ENCOUNTER — Telehealth: Payer: Self-pay | Admitting: *Deleted

## 2015-01-24 NOTE — Telephone Encounter (Signed)
Due to family request there will be a delay in starting services until the 29th

## 2015-01-28 ENCOUNTER — Ambulatory Visit: Payer: Medicare Other

## 2015-01-28 NOTE — Progress Notes (Signed)
[facility]  [phone] [phone]  ID: [name] OB: 1936-04-27  MR#: [id number]  VWP#:[id number]  Patient Care Team: Adin Hector, MD as PCP - General (Internal Medicine)  CHIEF COMPLAINT:  Chief Complaint  Patient presents with  . Follow-up    AML    INTERVAL HISTORY: Patient returns to clinic today for further evaluation and discussion on whether to continue treatment or pursue supportive care. She continues to feel weak and fatigued, but continues to improve on a daily basis. She is now back at home. She denies any fevers. She has no neurologic complaints. She has a fair appetite. She denies any chest pain or shortness of breath. She has no nausea, vomiting, constipation, or diarrhea. Patient offers no further specific complaints.  REVIEW OF SYSTEMS:   Review of Systems  Constitutional: Positive for malaise/fatigue. Negative for fever.  Respiratory: Negative.   Cardiovascular: Negative.   Neurological: Positive for weakness.    As per HPI. Otherwise, a complete review of systems is negatve.  PAST MEDICAL HISTORY: Past Medical History  Diagnosis Date  . Atrial fibrillation   . Parkinson's disease   . Glaucoma   . Hypertension   . Hypothyroidism   . GERD (gastroesophageal reflux disease)   . High cholesterol   . Diabetes mellitus without complication   . CAD (coronary artery disease) of bypass graft   . History of appendectomy   . H/O: hysterectomy   . Leukemia     PAST SURGICAL HISTORY: Past Surgical History  Procedure Laterality Date  . Cardiac valve replacement    . Pacemaker insertion      FAMILY HISTORY Family History  Problem Relation Age of Onset  . CAD Other   . Heart disease Father        ADVANCED DIRECTIVES:    HEALTH MAINTENANCE: Social History  Substance Use Topics  . Smoking status: Former Smoker -- 1.00 packs/day for 10 years    Types: Cigarettes  . Smokeless tobacco: Not on file  .  Alcohol Use: No     Colonoscopy:  PAP:  Bone density:  Lipid panel:  Allergies  Allergen Reactions  . Hydrochlorothiazide Other (See Comments)    Worsening hypercalcemia Worsening hypercalcemia Reaction:  Unknown   . Lisinopril Cough and Other (See Comments)    Worsening renal insufficiency at 56m dose Other reaction(s): Other (See Comments) Worsening renal insufficiency at 482mdose    Current Outpatient Prescriptions  Medication Sig Dispense Refill  . acetaminophen (TYLENOL) 325 MG tablet Take 650 mg by mouth every 4 (four) hours as needed for mild pain or headache.    . brimonidine (ALPHAGAN) 0.15 % ophthalmic solution Place 1 drop into both eyes 2 (two) times daily.    . carbidopa-levodopa (SINEMET IR) 25-100 MG per tablet Take 1 tablet by mouth 4 (four) times daily. 120 tablet 11  . cholecalciferol (VITAMIN D) 1000 UNITS tablet Take 1,000 Units by mouth daily.    . Marland Kitchennoxaparin (LOVENOX) 150 MG/ML injection Inject 0.33 mLs (50 mg total) into the skin every 12 (twelve) hours. 60 Syringe 11  . ferrous sulfate 325 (65 FE) MG tablet Take 325 mg by mouth daily with breakfast.    . furosemide (LASIX) 40 MG tablet Take 20 mg by mouth daily.    . Marland KitchenlipiZIDE (GLUCOTROL XL) 5 MG 24 hr tablet Take 5 mg by mouth daily with breakfast.    . latanoprost (XALATAN) 0.005 % ophthalmic solution Place 1 drop into both  eyes at bedtime.    Marland Kitchen levothyroxine (SYNTHROID, LEVOTHROID) 112 MCG tablet Take 1 tablet (112 mcg total) by mouth daily before breakfast. 30 tablet 11  . losartan (COZAAR) 50 MG tablet Take 1 tablet (50 mg total) by mouth daily. 30 tablet 11  . omeprazole (PRILOSEC) 40 MG capsule Take 40 mg by mouth 2 (two) times daily.    . posaconazole (NOXAFIL) 100 MG TBEC delayed-release tablet Take 300 mg by mouth daily. 3 tabs QD    . prochlorperazine (COMPAZINE) 10 MG tablet Take 1 tablet (10 mg total) by mouth every 6 (six) hours as needed (Nausea or vomiting). 30 tablet 1  . valACYclovir  (VALTREX) 500 MG tablet Take 500 mg by mouth every morning.     . vitamin B-12 (CYANOCOBALAMIN) 250 MCG tablet Take 250 mcg by mouth daily.     No current facility-administered medications for this visit.   Facility-Administered Medications Ordered in Other Visits  Medication Dose Route Frequency Provider Last Rate Last Dose  . 0.9 %  sodium chloride infusion   Intravenous Continuous Lloyd Huger, MD 999 mL/hr at 10/29/14 1550      OBJECTIVE: Filed Vitals:   01/23/15 1437  BP: 97/62  Pulse: 106  Temp: 96.1 F (35.6 C)  Resp: 16     There is no weight on file to calculate BMI.    ECOG FS:2 - Symptomatic, <50% confined to bed  General: Well-developed, well-nourished, no acute distress. Eyes: anicteric sclera. Lungs: Clear to auscultation bilaterally. Heart: Regular rate and rhythm. No rubs, murmurs, or gallops. Abdomen: Soft, nontender, nondistended. No organomegaly noted, normoactive bowel sounds. Musculoskeletal: No edema, cyanosis, or clubbing. Neuro: Alert, answering all questions appropriately. Cranial nerves grossly intact. Skin: No rashes or petechiae noted. Psych: Normal affect.   LAB RESULTS:  Lab Results  Component Value Date   NA 137 01/23/2015   K 4.1 01/23/2015   CL 107 01/23/2015   CO2 26 01/23/2015   GLUCOSE 131* 01/23/2015   BUN 20 01/23/2015   CREATININE 1.45* 01/23/2015   CALCIUM 9.0 01/23/2015   PROT 5.9* 01/03/2015   ALBUMIN 3.0* 01/03/2015   AST 9* 01/03/2015   ALT <5* 01/03/2015   ALKPHOS 62 01/03/2015   BILITOT 0.4 01/03/2015   GFRNONAA 34* 01/23/2015   GFRAA 39* 01/23/2015    Lab Results  Component Value Date   WBC 1.3* 01/23/2015   NEUTROABS 0.7* 01/23/2015   HGB 8.4* 01/23/2015   HCT 25.1* 01/23/2015   MCV 90.2 01/23/2015   PLT 77* 01/23/2015     STUDIES: Dg Chest 1 View  12/30/2014   CLINICAL DATA:  Increasing weakness over the past couple of weeks.  EXAM: CHEST  1 VIEW  COMPARISON:  Single view of the chest 10/30/2014.  PA and lateral chest 06/11/2014.  FINDINGS: Dual lead pacing device is in place in the patient is status post CABG. There is cardiomegaly and pulmonary vascular congestion, unchanged in appearance. No consolidative process, pneumothorax or effusion.  IMPRESSION: Cardiomegaly and pulmonary vascular congestion.  No acute finding.   Electronically Signed   By: Inge Rise M.D.   On: 12/30/2014 13:02    ASSESSMENT: AML  PLAN:    1. AML: After lengthy discussion with the patient and her husband, she does not wish to pursue additional treatment at this time. Her strength is improving and she is not ready to enroll in hospice either. No intervention or treatment is planned. Patient will return to clinic in 4 weeks with repeat  laboratory work and further evaluation. Of note, her most recent bone marrow biopsy was improved, but still contained AML at approximately 19% of her sample. She completed 4 cycles of azacitidine 75 mg/m IV. She will receive this treatment on days 1 through 5 as well as on day 8 and 9.  2. Pancytopenia: Improving.  Secondary to AML as well as chemotherapy. Monitor.  Approximately 30 minutes was spent in discussion and consultation.  Patient expressed understanding and was in agreement with this plan. She also understands that She can call clinic at any time with any questions, concerns, or complaints.   Lloyd Huger, MD   01/28/2015 8:38 AM

## 2015-02-04 ENCOUNTER — Ambulatory Visit: Payer: Medicare Other

## 2015-02-04 ENCOUNTER — Telehealth: Payer: Self-pay | Admitting: *Deleted

## 2015-02-04 NOTE — Telephone Encounter (Signed)
Patient states that [facility]/Dr. Janene Madeira was supposed to fax an order to Dr. Grayland Ormond so that a cbc could be drawn tomorrow in the clinic. hgb on Friday was 7.7 and the plt count was 60. Pt states that the labs were supposed to be drawn twice a week at [facility]. Pt's husband requesting call back by the end of the working day with this appointment.

## 2015-02-05 ENCOUNTER — Other Ambulatory Visit: Payer: Medicare Other

## 2015-02-05 ENCOUNTER — Telehealth: Payer: Self-pay | Admitting: *Deleted

## 2015-02-05 ENCOUNTER — Inpatient Hospital Stay: Payer: Medicare Other

## 2015-02-05 DIAGNOSIS — T451X5A Adverse effect of antineoplastic and immunosuppressive drugs, initial encounter: Secondary | ICD-10-CM

## 2015-02-05 DIAGNOSIS — Z5189 Encounter for other specified aftercare: Secondary | ICD-10-CM

## 2015-02-05 DIAGNOSIS — D61810 Antineoplastic chemotherapy induced pancytopenia: Secondary | ICD-10-CM

## 2015-02-05 DIAGNOSIS — C9200 Acute myeloblastic leukemia, not having achieved remission: Secondary | ICD-10-CM | POA: Diagnosis not present

## 2015-02-05 LAB — CBC WITH DIFFERENTIAL/PLATELET
Basophils Absolute: 0 10*3/uL (ref 0–0.1)
Eosinophils Absolute: 0 10*3/uL (ref 0–0.7)
Eosinophils Relative: 3 %
HCT: 24.2 % — ABNORMAL LOW (ref 35.0–47.0)
HEMOGLOBIN: 8.1 g/dL — AB (ref 12.0–16.0)
Lymphocytes Relative: 42 %
Lymphs Abs: 0.4 10*3/uL — ABNORMAL LOW (ref 1.0–3.6)
MCH: 30.7 pg (ref 26.0–34.0)
MCHC: 33.6 g/dL (ref 32.0–36.0)
MCV: 91.5 fL (ref 80.0–100.0)
MONO ABS: 0 10*3/uL — AB (ref 0.2–0.9)
Monocytes Relative: 4 %
NEUTROS ABS: 0.5 10*3/uL — AB (ref 1.4–6.5)
Platelets: 59 10*3/uL — ABNORMAL LOW (ref 150–440)
RBC: 2.65 MIL/uL — ABNORMAL LOW (ref 3.80–5.20)
RDW: 25.2 % — AB (ref 11.5–14.5)
WBC: 1.1 10*3/uL — AB (ref 3.6–11.0)

## 2015-02-05 LAB — BASIC METABOLIC PANEL
ANION GAP: 4 — AB (ref 5–15)
BUN: 14 mg/dL (ref 6–20)
CALCIUM: 9.6 mg/dL (ref 8.9–10.3)
CO2: 28 mmol/L (ref 22–32)
Chloride: 108 mmol/L (ref 101–111)
Creatinine, Ser: 1.13 mg/dL — ABNORMAL HIGH (ref 0.44–1.00)
GFR calc Af Amer: 53 mL/min — ABNORMAL LOW (ref >60)
GFR calc non Af Amer: 46 mL/min — ABNORMAL LOW (ref >60)
Glucose, Bld: 121 mg/dL — ABNORMAL HIGH (ref 65–99)
Potassium: 3.6 mmol/L (ref 3.5–5.1)
Sodium: 140 mmol/L (ref 135–145)

## 2015-02-05 LAB — SAMPLE TO BLOOD BANK

## 2015-02-05 NOTE — Telephone Encounter (Signed)
Asking for appt today for lab check, her last plt count was 60 and Dr Janene Madeira told them not to take Lovenox if her count is 50 or below. She has had a ha since Sunday and her ears are getting clogged like she does when her blood is low so he wants her labs checked. Per Dr Grayland Ormond, check CBC today. Mr Tortora agree to have pt here at 11:45

## 2015-02-06 ENCOUNTER — Emergency Department
Admission: EM | Admit: 2015-02-06 | Discharge: 2015-02-06 | Disposition: A | Discharge status: Home / Self Care | Payer: Medicare Other | Attending: Emergency Medicine | Admitting: Emergency Medicine

## 2015-02-06 ENCOUNTER — Encounter: Payer: Self-pay | Admitting: *Deleted

## 2015-02-06 ENCOUNTER — Emergency Department: Payer: Medicare Other

## 2015-02-06 DIAGNOSIS — C9200 Acute myeloblastic leukemia, not having achieved remission: Secondary | ICD-10-CM | POA: Insufficient documentation

## 2015-02-06 DIAGNOSIS — R51 Headache: Secondary | ICD-10-CM | POA: Diagnosis not present

## 2015-02-06 DIAGNOSIS — R519 Headache, unspecified: Secondary | ICD-10-CM

## 2015-02-06 DIAGNOSIS — I10 Essential (primary) hypertension: Secondary | ICD-10-CM | POA: Diagnosis not present

## 2015-02-06 DIAGNOSIS — Z87891 Personal history of nicotine dependence: Secondary | ICD-10-CM | POA: Diagnosis not present

## 2015-02-06 DIAGNOSIS — E119 Type 2 diabetes mellitus without complications: Secondary | ICD-10-CM | POA: Diagnosis not present

## 2015-02-06 DIAGNOSIS — Z79899 Other long term (current) drug therapy: Secondary | ICD-10-CM | POA: Diagnosis not present

## 2015-02-06 DIAGNOSIS — D649 Anemia, unspecified: Secondary | ICD-10-CM

## 2015-02-06 LAB — PROTIME-INR
INR: 1.18
PROTHROMBIN TIME: 15.2 s — AB (ref 11.4–15.0)

## 2015-02-06 LAB — CBC
HCT: 22.7 % — ABNORMAL LOW (ref 35.0–47.0)
Hemoglobin: 7.6 g/dL — ABNORMAL LOW (ref 12.0–16.0)
MCH: 30.5 pg (ref 26.0–34.0)
MCHC: 33.3 g/dL (ref 32.0–36.0)
MCV: 91.5 fL (ref 80.0–100.0)
PLATELETS: 67 10*3/uL — AB (ref 150–440)
RBC: 2.48 MIL/uL — ABNORMAL LOW (ref 3.80–5.20)
RDW: 24.9 % — ABNORMAL HIGH (ref 11.5–14.5)
WBC: 1 10*3/uL — AB (ref 3.6–11.0)

## 2015-02-06 LAB — BASIC METABOLIC PANEL
ANION GAP: 5 (ref 5–15)
BUN: 14 mg/dL (ref 6–20)
CALCIUM: 9.4 mg/dL (ref 8.9–10.3)
CO2: 27 mmol/L (ref 22–32)
Chloride: 107 mmol/L (ref 101–111)
Creatinine, Ser: 0.99 mg/dL (ref 0.44–1.00)
GFR calc Af Amer: >60 mL/min (ref >60)
GFR, EST NON AFRICAN AMERICAN: 54 mL/min — AB (ref >60)
Glucose, Bld: 120 mg/dL — ABNORMAL HIGH (ref 65–99)
Potassium: 4.3 mmol/L (ref 3.5–5.1)
SODIUM: 139 mmol/L (ref 135–145)

## 2015-02-06 LAB — PREPARE RBC (CROSSMATCH)

## 2015-02-06 MED ORDER — SODIUM CHLORIDE 0.9 % IV BOLUS (SEPSIS)
500.0000 mL | Freq: Once | INTRAVENOUS | Status: AC
  Administered 2015-02-06: 500 mL via INTRAVENOUS

## 2015-02-06 MED ORDER — SODIUM CHLORIDE 0.9 % IV SOLN: 10.0000 mL/h | Freq: Once | INTRAVENOUS | Status: DC

## 2015-02-06 MED ORDER — METOCLOPRAMIDE HCL 5 MG/ML IJ SOLN
5.0000 mg | Freq: Once | INTRAMUSCULAR | Status: AC
  Administered 2015-02-06: 5 mg via INTRAVENOUS
  Filled 2015-02-06: qty 2

## 2015-02-06 NOTE — ED Notes (Signed)
Blood transfusion complete with no reaction.

## 2015-02-06 NOTE — Discharge Instructions (Signed)
Your hemoglobin level has dropped over the past 3 weeks. This may be causing her headache. We discussed this with you as well as with your oncologist, Dr. Grayland Ormond. We agreed with a transfusion of 1 unit now.  Follow-up with Dr. Grayland Ormond this coming week as planned. Return to the emergency department if your headache worsens, if he feel weak, or if you have other urgent concerns.  Acute Myeloid Leukemia Acute myeloid leukemia (AML) is a rapid growth cancer of the blood and the soft tissue inside your bones (bone marrow). Normally, your bone marrow makes blast cells that develop into important white blood cells called myeloid cells (and several other types of mature blood cells). These mature cells help to fight infection, carry oxygen, and stop bleeding. With AML, the bone marrow makes abnormal, or unformed myeloid blast cells. These abnormal cells develop into leukemia cells and occupy space in the blood where healthy cells need room. The rapidly growing leukemia cells begin to take over. Leukemia cells do not fight infection or carry out other important jobs in the blood, and symptoms of infection and illness appear. There are several types of AML depending on the stage and characteristics of the leukemia cells. CAUSES  Experts are not clear on what causes the bone marrow to produce abnormal cells that lead to leukemia. For the most part, it does not appear to be genetic, but related to other external factors.  RISK FACTORS Risk factors include:  Age 12 years and older.  Female.  Smoking.  History of chemotherapy or radiation therapy.  Exposure to chemicals.  Other blood disorders.  Genetic disorders, such as Down syndrome. SYMPTOMS   Poor appetite.  Tiring easily.  Weakness.  Shortness of breath.  Repeat infections.  Low-grade fevers.  Bone pain or aches.  Joint pain or aches.  Abdominal pain.  Pale skin.  Bruising.  Nosebleeds and easy bleeding from minor cuts.  Slow  healing of cuts.  Spots on the skin.  Swollen glands.  Headache.  Weight loss.  Swollen gums.  Lumps under the skin. DIAGNOSIS  The diagnosis of AML is made by tests such as:  Blood tests to check blood cell counts and the shape of the blood cells.  Sampling parts of bone that make blood cells (bone marrow).  Genetic testing.  Sampling spinal fluid for leukemia cells.  A biopsy of lumps to check for leukemia cells.  X-ray exams, ultrasonography, or CT scans. TREATMENT  The type of AML diagnosed will guide treatment options. Treatment can last for several months up to 2-3 years. Treatment aims to destroy leukemia cells as well as stop new diseased cells from growing. Treatment may include:  Chemotherapy.  Radiation therapy to kill cancer cells.  Targeted medicines to treat specific chromosomal mutations.  Stem cell transplant to replace diseased bone marrow with healthy donor bone marrow.  Experimental treatments through clinical trials.  In rare cases, surgery. HOME CARE INSTRUCTIONS  When you are on chemotherapy:  You and and any visitors should wash hands often. Wash hands before meals, after being outside, and after using the toilet.  Keep your teeth and gums clean and well cared for. Use a soft toothbrush.  Talk with your health care provider about the safety of immunizations.  When visiting a health care facility, ask about side entrances or waiting areas where you will not be exposed to infections.  Take medicines only as directed by your health care provider.  Use a good sun block and clothing  to prevent sun exposure.  Usually, it is recommended that other family members receive an influenza shot every year. SEEK MEDICAL CARE IF:   You have a cough or cold symptoms.  You have a sore throat.  You have painful urination.  You have frequent diarrhea.  You have frequent vomiting.  You have a skin rash.  You have a fever.  You have  chills.  You have been exposed to chickenpox or measles, especially if you have not been immunized or are not immune to these illnesses. SEEK IMMEDIATE MEDICAL CARE IF:   You have trouble breathing.  You have blood in your urine or feces (stools). Document Released: 03/15/2013 Document Revised: 10/09/2013 Document Reviewed: 03/15/2013 [facility] Patient Information 2015 [place], [place]. This information is not intended to replace advice given to you by your health care provider. Make sure you discuss any questions you have with your health care provider.

## 2015-02-06 NOTE — ED Notes (Signed)
Lab called stating pt's sample had hemolyzed. This nurse attempting to draw blood and start 2nd IV.

## 2015-02-06 NOTE — ED Notes (Signed)
Patient states she developed a headache on Saturday and mild dizziness. Patient states head pain is constant, but dizziness has worsened since Saturday. Patient states she has been taking Tylenol without relief. Patient states she has a history of leukemia and had blood drawn yesterday at the [facility].

## 2015-02-06 NOTE — ED Provider Notes (Signed)
[facility] Emergency Department Provider Note  ____________________________________________  Time seen: 74  I have reviewed the triage vital signs and the nursing notes.  History from both patient and her husband. Her husband provides majority of information.  HISTORY  Chief Complaint Headache     HPI [name] is a 79 y.o. female who has leukemia. She presents to the emergency department complaining of a headache. This began this past Friday. It has been steady. It is not throbbing. She's had mild nausea at times with no emesis. No nausea currently. She was seen at the cancer center yesterday due to concerns for the headache and the possibility that she may once again be anemic and needing a transfusion. Her hemoglobin level yesterday was 8.1. She sees Dr. Maryjane Hurter at the cancer center. He did not see her yesterday, he but he did see her last week.  The patient denies any fever. She denies any focal weakness.     Past Medical History  Diagnosis Date  . Atrial fibrillation   . Parkinson's disease   . Glaucoma   . Hypertension   . Hypothyroidism   . GERD (gastroesophageal reflux disease)   . High cholesterol   . Diabetes mellitus without complication   . CAD (coronary artery disease) of bypass graft   . History of appendectomy   . H/O: hysterectomy   . Leukemia     Patient Active Problem List   Diagnosis Date Noted  . Pulmonary edema 12/30/2014  . Chronic diastolic congestive heart failure 12/26/2014  . Malnutrition of moderate degree 12/23/2014  . Weakness 12/21/2014  . AML (acute myeloblastic leukemia)   . Atrial fibrillation 10/31/2014  . Mechanical heart valve present 10/31/2014  . Coronary artery disease 10/31/2014  . Essential hypertension 10/31/2014  . Antineoplastic chemotherapy induced pancytopenia 10/31/2014    Past Surgical History  Procedure Laterality Date  . Cardiac valve replacement    . Pacemaker insertion       Current Outpatient Rx  Name  Route  Sig  Dispense  Refill  . acetaminophen (TYLENOL) 325 MG tablet   Oral   Take 650 mg by mouth every 4 (four) hours as needed for mild pain or headache.         . brimonidine (ALPHAGAN) 0.15 % ophthalmic solution   Both Eyes   Place 1 drop into both eyes 2 (two) times daily.         . carbidopa-levodopa (SINEMET IR) 25-100 MG per tablet   Oral   Take 1 tablet by mouth 4 (four) times daily.   120 tablet   11   . cholecalciferol (VITAMIN D) 1000 UNITS tablet   Oral   Take 1,000 Units by mouth daily.         Marland Kitchen enoxaparin (LOVENOX) 150 MG/ML injection   Subcutaneous   Inject 0.33 mLs (50 mg total) into the skin every 12 (twelve) hours. Patient taking differently: Inject 50 mg into the skin daily.    60 Syringe   11   . ferrous sulfate 325 (65 FE) MG tablet   Oral   Take 325 mg by mouth daily.          . furosemide (LASIX) 40 MG tablet   Oral   Take 20 mg by mouth daily.         Marland Kitchen glipiZIDE (GLUCOTROL XL) 5 MG 24 hr tablet   Oral   Take 5 mg by mouth daily with breakfast.         .  latanoprost (XALATAN) 0.005 % ophthalmic solution   Both Eyes   Place 1 drop into both eyes at bedtime.         Marland Kitchen levothyroxine (SYNTHROID, LEVOTHROID) 112 MCG tablet   Oral   Take 1 tablet (112 mcg total) by mouth daily before breakfast.   30 tablet   11   . losartan (COZAAR) 50 MG tablet   Oral   Take 1 tablet (50 mg total) by mouth daily.   30 tablet   11   . omeprazole (PRILOSEC) 40 MG capsule   Oral   Take 40 mg by mouth 2 (two) times daily.         . posaconazole (NOXAFIL) 100 MG TBEC delayed-release tablet   Oral   Take 300 mg by mouth daily.          . prochlorperazine (COMPAZINE) 10 MG tablet   Oral   Take 1 tablet (10 mg total) by mouth every 6 (six) hours as needed (Nausea or vomiting). Patient taking differently: Take 10 mg by mouth every 6 (six) hours as needed for nausea or vomiting.    30 tablet   1   .  valACYclovir (VALTREX) 500 MG tablet   Oral   Take 500 mg by mouth daily.         . vitamin B-12 (CYANOCOBALAMIN) 250 MCG tablet   Oral   Take 250 mcg by mouth daily.           Allergies Hydrochlorothiazide and Lisinopril  Family History  Problem Relation Age of Onset  . CAD Other   . Heart disease Father     Social History Social History  Substance Use Topics  . Smoking status: Former Smoker -- 1.00 packs/day for 10 years    Types: Cigarettes  . Smokeless tobacco: None  . Alcohol Use: No    Review of Systems  Constitutional: Negative for fever. ENT: Negative for sore throat. Cardiovascular: Negative for chest pain. Respiratory: Negative for shortness of breath. Gastrointestinal: Negative for abdominal pain, vomiting and diarrhea. Genitourinary: Negative for dysuria. Musculoskeletal: No myalgias or injuries. Skin: Negative for rash. Neurological: Positive for headaches Hematological: History of leukemia.  10-point ROS otherwise negative.  ____________________________________________   PHYSICAL EXAM:  VITAL SIGNS: ED Triage Vitals  Enc Vitals Group     BP 02/06/15 1249 166/51 mmHg     Pulse Rate 02/06/15 1249 62     Resp 02/06/15 1249 18     Temp 02/06/15 1249 98.1 F (36.7 C)     Temp Source 02/06/15 1249 Oral     SpO2 02/06/15 1249 97 %     Weight 02/06/15 1249 109 lb (49.442 kg)     Height 02/06/15 1249 5\' 3"  (1.6 m)     Head Cir --      Peak Flow --      Pain Score 02/06/15 1301 7     Pain Loc --      Pain Edu? --      Excl. in [place]? --     Constitutional: Alert. Somewhat quiet. Her husband provides most of the history.  Well appearing and in no distress. ENT    Head: Normocephalic and atraumatic.   Nose: No congestion/rhinnorhea.   Mouth/Throat: Mucous membranes are moist. Cardiovascular: Normal rate, regular rhythm, notable mechanical murmur. Respiratory:  Normal respiratory effort, no tachypnea.    Breath sounds are clear and  equal bilaterally.  Gastrointestinal: Soft and nontender. No distention.  Back: No muscle spasm,  no tenderness, no CVA tenderness. Musculoskeletal: No deformity noted. Nontender with normal range of motion in all extremities. Minimal 1+ edema in ankles.  Neurologic:  Quiet, reserved affect. Moves all 4 extremities without difficulty. No focal neurologic deficit. Skin:  Skin is warm, dry. No rash noted. Psychiatric: Quiet, reserved affect. ____________________________________________    LABS (pertinent positives/negatives)  Labs Reviewed  CBC - Abnormal; Notable for the following:    WBC 1.0 (*)    RBC 2.48 (*)    Hemoglobin 7.6 (*)    HCT 22.7 (*)    RDW 24.9 (*)    Platelets 67 (*)    All other components within normal limits  BASIC METABOLIC PANEL - Abnormal; Notable for the following:    Glucose, Bld 120 (*)    GFR calc non Af Amer 54 (*)    All other components within normal limits  PROTIME-INR - Abnormal; Notable for the following:    Prothrombin Time 15.2 (*)    All other components within normal limits  PREPARE RBC (CROSSMATCH)  TYPE AND SCREEN  TYPE AND SCREEN     ____________________________________________   EKG  ED ECG REPORT I, Ahlijah Raia W, the attending physician, personally viewed and interpreted this ECG.   Date: 02/06/2015  EKG Time: 12:35 PM  Rate: 60  Rhythm: Electronically paced, atrial  Axis: Normal  Intervals: Normal  ST&T Change: None noted   ____________________________________________    RADIOLOGY  CT head IMPRESSION: No acute intracranial findings.  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  ____________________________________________   PROCEDURES  Blood transfusion: Due to the patient's progressing anemia and current symptoms, the patient is being transfused with 1 unit of typed specific blood. I had spoken with Dr. Grayland Ormond who agreed with this plan. I discussed this with  the patient and her husband. We discussed risk and benefits of blood transfusion. They outlined how she has gone through this numerous times. They understand the risks and agree with the transfusion.   ____________________________________________   INITIAL IMPRESSION / ASSESSMENT AND PLAN / ED COURSE  Pertinent labs & imaging results that were available during my care of the patient were reviewed by me and considered in my medical decision making (see chart for details).  79 year old female with acute leukemia. I discussed the case with her oncologist, Dr. Alyssa Grove. He reports that she has been doing worse lately and she decided to stop treatment. Now, she has a steady headache for 4-5 days, which I suspect is linked to the worsening anemia. We will transfuse one unit now. Dr. Grayland Ormond reports she can follow-up with her this coming week. She is scheduled for an appointment at that time.   ----------------------------------------- 9:34 PM on 02/06/2015 -----------------------------------------  Transfusion of 1 unit has been completed. I rechecked the patient. She is alert and communicative and feeling a little bit better.  We will discharge the patient and have her follow-up with Dr. Grayland Ormond as initially planned above.  ____________________________________________   FINAL CLINICAL IMPRESSION(S) / ED DIAGNOSES  Final diagnoses:  AML (acute myeloblastic leukemia)  Symptomatic anemia  Acute nonintractable headache, unspecified headache type      Ahmed Prima, MD 02/06/15 2138

## 2015-02-06 NOTE — ED Notes (Signed)
Pt voices no concerns.  Blood continues to infuse.  Husband at bedside.

## 2015-02-07 LAB — TYPE AND SCREEN
ABO/RH(D): O POS
ANTIBODY SCREEN: NEGATIVE
UNIT DIVISION: 0

## 2015-02-12 ENCOUNTER — Inpatient Hospital Stay: Payer: Medicare Other | Attending: Oncology

## 2015-02-12 ENCOUNTER — Ambulatory Visit: Payer: Medicare Other

## 2015-02-12 ENCOUNTER — Other Ambulatory Visit: Payer: Medicare Other

## 2015-02-12 ENCOUNTER — Other Ambulatory Visit: Payer: Self-pay | Admitting: Oncology

## 2015-02-12 DIAGNOSIS — G20 Parkinson's disease: Secondary | ICD-10-CM | POA: Insufficient documentation

## 2015-02-12 DIAGNOSIS — E119 Type 2 diabetes mellitus without complications: Secondary | ICD-10-CM | POA: Insufficient documentation

## 2015-02-12 DIAGNOSIS — D696 Thrombocytopenia, unspecified: Secondary | ICD-10-CM | POA: Insufficient documentation

## 2015-02-12 DIAGNOSIS — R531 Weakness: Secondary | ICD-10-CM | POA: Insufficient documentation

## 2015-02-12 DIAGNOSIS — Z79899 Other long term (current) drug therapy: Secondary | ICD-10-CM | POA: Diagnosis not present

## 2015-02-12 DIAGNOSIS — D709 Neutropenia, unspecified: Secondary | ICD-10-CM | POA: Diagnosis not present

## 2015-02-12 DIAGNOSIS — C9200 Acute myeloblastic leukemia, not having achieved remission: Secondary | ICD-10-CM

## 2015-02-12 DIAGNOSIS — E039 Hypothyroidism, unspecified: Secondary | ICD-10-CM | POA: Insufficient documentation

## 2015-02-12 DIAGNOSIS — E780 Pure hypercholesterolemia: Secondary | ICD-10-CM | POA: Insufficient documentation

## 2015-02-12 DIAGNOSIS — I10 Essential (primary) hypertension: Secondary | ICD-10-CM | POA: Insufficient documentation

## 2015-02-12 DIAGNOSIS — Z90710 Acquired absence of both cervix and uterus: Secondary | ICD-10-CM | POA: Diagnosis not present

## 2015-02-12 DIAGNOSIS — I4891 Unspecified atrial fibrillation: Secondary | ICD-10-CM | POA: Diagnosis not present

## 2015-02-12 DIAGNOSIS — D649 Anemia, unspecified: Secondary | ICD-10-CM | POA: Insufficient documentation

## 2015-02-12 DIAGNOSIS — R5381 Other malaise: Secondary | ICD-10-CM | POA: Diagnosis not present

## 2015-02-12 DIAGNOSIS — I25810 Atherosclerosis of coronary artery bypass graft(s) without angina pectoris: Secondary | ICD-10-CM | POA: Insufficient documentation

## 2015-02-12 DIAGNOSIS — R5383 Other fatigue: Secondary | ICD-10-CM | POA: Diagnosis not present

## 2015-02-12 DIAGNOSIS — I2510 Atherosclerotic heart disease of native coronary artery without angina pectoris: Secondary | ICD-10-CM | POA: Insufficient documentation

## 2015-02-12 DIAGNOSIS — K219 Gastro-esophageal reflux disease without esophagitis: Secondary | ICD-10-CM | POA: Insufficient documentation

## 2015-02-12 DIAGNOSIS — Z87891 Personal history of nicotine dependence: Secondary | ICD-10-CM | POA: Insufficient documentation

## 2015-02-12 LAB — CBC WITH DIFFERENTIAL/PLATELET
BASOS ABS: 0 10*3/uL (ref 0–0.1)
Basophils Relative: 0 %
Eosinophils Absolute: 0 10*3/uL (ref 0–0.7)
Eosinophils Relative: 3 %
HEMATOCRIT: 22.4 % — AB (ref 35.0–47.0)
Hemoglobin: 7.6 g/dL — ABNORMAL LOW (ref 12.0–16.0)
Lymphs Abs: 0.5 10*3/uL — ABNORMAL LOW (ref 1.0–3.6)
MCH: 30.6 pg (ref 26.0–34.0)
MCHC: 34.1 g/dL (ref 32.0–36.0)
MCV: 89.7 fL (ref 80.0–100.0)
MONO ABS: 0 10*3/uL — AB (ref 0.2–0.9)
Monocytes Relative: 3 %
NEUTROS ABS: 0.3 10*3/uL — AB (ref 1.4–6.5)
Neutrophils Relative %: 40 %
Platelets: 46 10*3/uL — ABNORMAL LOW (ref 150–440)
RBC: 2.49 MIL/uL — AB (ref 3.80–5.20)
RDW: 23.5 % — AB (ref 11.5–14.5)
WBC: 0.9 10*3/uL — CL (ref 3.6–11.0)

## 2015-02-12 LAB — SAMPLE TO BLOOD BANK

## 2015-02-12 LAB — BASIC METABOLIC PANEL
ANION GAP: 4 — AB (ref 5–15)
BUN: 11 mg/dL (ref 6–20)
CHLORIDE: 103 mmol/L (ref 101–111)
CO2: 29 mmol/L (ref 22–32)
Calcium: 8.8 mg/dL — ABNORMAL LOW (ref 8.9–10.3)
Creatinine, Ser: 1.04 mg/dL — ABNORMAL HIGH (ref 0.44–1.00)
GFR calc Af Amer: 59 mL/min — ABNORMAL LOW (ref >60)
GFR calc non Af Amer: 50 mL/min — ABNORMAL LOW (ref >60)
GLUCOSE: 118 mg/dL — AB (ref 65–99)
POTASSIUM: 3.6 mmol/L (ref 3.5–5.1)
Sodium: 136 mmol/L (ref 135–145)

## 2015-02-12 NOTE — Progress Notes (Signed)
Spoke with patient and her husband: Dr. Grayland Ormond recommend patient to hold Lovenox injections due to low platelets and f/u in the clinic on 02/18/15 for lab/MD/possible blood transfusion.

## 2015-02-15 ENCOUNTER — Other Ambulatory Visit: Payer: Self-pay | Admitting: Oncology

## 2015-02-15 ENCOUNTER — Other Ambulatory Visit: Payer: Self-pay | Admitting: Family Medicine

## 2015-02-15 ENCOUNTER — Telehealth: Payer: Self-pay | Admitting: *Deleted

## 2015-02-15 ENCOUNTER — Inpatient Hospital Stay: Payer: Medicare Other

## 2015-02-15 ENCOUNTER — Ambulatory Visit: Payer: Medicare Other

## 2015-02-15 DIAGNOSIS — C9590 Leukemia, unspecified not having achieved remission: Secondary | ICD-10-CM

## 2015-02-15 DIAGNOSIS — C9200 Acute myeloblastic leukemia, not having achieved remission: Secondary | ICD-10-CM | POA: Diagnosis not present

## 2015-02-15 LAB — CBC WITH DIFFERENTIAL/PLATELET
BASOS ABS: 0 10*3/uL (ref 0–0.1)
Eosinophils Absolute: 0 10*3/uL (ref 0–0.7)
Eosinophils Relative: 3 %
HEMATOCRIT: 23.9 % — AB (ref 35.0–47.0)
HEMOGLOBIN: 8 g/dL — AB (ref 12.0–16.0)
Lymphocytes Relative: 59 %
Lymphs Abs: 0.6 10*3/uL — ABNORMAL LOW (ref 1.0–3.6)
MCH: 30.2 pg (ref 26.0–34.0)
MCHC: 33.6 g/dL (ref 32.0–36.0)
MCV: 89.9 fL (ref 80.0–100.0)
Monocytes Absolute: 0 10*3/uL — ABNORMAL LOW (ref 0.2–0.9)
Monocytes Relative: 4 %
NEUTROS ABS: 0.4 10*3/uL — AB (ref 1.4–6.5)
Platelets: 49 10*3/uL — ABNORMAL LOW (ref 150–440)
RBC: 2.66 MIL/uL — ABNORMAL LOW (ref 3.80–5.20)
RDW: 23.9 % — ABNORMAL HIGH (ref 11.5–14.5)
WBC: 1 10*3/uL — CL (ref 3.6–11.0)

## 2015-02-15 LAB — SAMPLE TO BLOOD BANK

## 2015-02-15 MED ORDER — SODIUM CHLORIDE 0.9 % IV SOLN
Freq: Once | INTRAVENOUS | Status: AC
  Administered 2015-02-15: 16:00:00 via INTRAVENOUS
  Filled 2015-02-15: qty 1000

## 2015-02-15 NOTE — Telephone Encounter (Signed)
Coming in at 230 for lab possible IVF per NP, Depending on lab results, NP will see pt in infusion

## 2015-02-15 NOTE — Addendum Note (Signed)
Addended by: Betti Cruz on: 02/15/2015 02:09 PM   Modules accepted: Orders

## 2015-02-15 NOTE — Telephone Encounter (Signed)
Please get cbc and hold tube for blood and see if MD or NP in [place] can evaluate her, schedule for blood on Monday just in case.  Thanks.

## 2015-02-15 NOTE — Telephone Encounter (Signed)
Called asking to be seen today as she has sx of low blood:HA, ears ringing, dizziness

## 2015-02-18 ENCOUNTER — Inpatient Hospital Stay (HOSPITAL_BASED_OUTPATIENT_CLINIC_OR_DEPARTMENT_OTHER): Payer: Medicare Other | Admitting: Oncology

## 2015-02-18 ENCOUNTER — Inpatient Hospital Stay: Payer: Medicare Other

## 2015-02-18 ENCOUNTER — Ambulatory Visit: Payer: Medicare Other

## 2015-02-18 VITALS — BP 156/85 | HR 80 | Temp 97.0°F | Resp 20

## 2015-02-18 DIAGNOSIS — R5381 Other malaise: Secondary | ICD-10-CM

## 2015-02-18 DIAGNOSIS — C9200 Acute myeloblastic leukemia, not having achieved remission: Secondary | ICD-10-CM

## 2015-02-18 DIAGNOSIS — I25810 Atherosclerosis of coronary artery bypass graft(s) without angina pectoris: Secondary | ICD-10-CM

## 2015-02-18 DIAGNOSIS — D709 Neutropenia, unspecified: Secondary | ICD-10-CM | POA: Diagnosis not present

## 2015-02-18 DIAGNOSIS — R5383 Other fatigue: Secondary | ICD-10-CM

## 2015-02-18 DIAGNOSIS — I10 Essential (primary) hypertension: Secondary | ICD-10-CM

## 2015-02-18 DIAGNOSIS — I2510 Atherosclerotic heart disease of native coronary artery without angina pectoris: Secondary | ICD-10-CM

## 2015-02-18 DIAGNOSIS — D649 Anemia, unspecified: Secondary | ICD-10-CM

## 2015-02-18 DIAGNOSIS — D696 Thrombocytopenia, unspecified: Secondary | ICD-10-CM

## 2015-02-18 DIAGNOSIS — I4891 Unspecified atrial fibrillation: Secondary | ICD-10-CM

## 2015-02-18 DIAGNOSIS — G620 Drug-induced polyneuropathy: Secondary | ICD-10-CM

## 2015-02-18 DIAGNOSIS — R531 Weakness: Secondary | ICD-10-CM

## 2015-02-18 LAB — CBC WITH DIFFERENTIAL/PLATELET
BASOS ABS: 0 10*3/uL (ref 0–0.1)
EOS ABS: 0 10*3/uL (ref 0–0.7)
HCT: 22.3 % — ABNORMAL LOW (ref 35.0–47.0)
HEMOGLOBIN: 7.5 g/dL — AB (ref 12.0–16.0)
Lymphocytes Relative: 57 %
Lymphs Abs: 0.5 10*3/uL — ABNORMAL LOW (ref 1.0–3.6)
MCH: 30.2 pg (ref 26.0–34.0)
MCHC: 33.6 g/dL (ref 32.0–36.0)
MCV: 90 fL (ref 80.0–100.0)
Monocytes Absolute: 0 10*3/uL — ABNORMAL LOW (ref 0.2–0.9)
Monocytes Relative: 4 %
Neutro Abs: 0.3 10*3/uL — ABNORMAL LOW (ref 1.4–6.5)
PLATELETS: 52 10*3/uL — AB (ref 150–440)
RBC: 2.48 MIL/uL — AB (ref 3.80–5.20)
RDW: 24.1 % — ABNORMAL HIGH (ref 11.5–14.5)
WBC: 0.9 10*3/uL — AB (ref 3.6–11.0)

## 2015-02-18 LAB — BASIC METABOLIC PANEL
ANION GAP: 3 — AB (ref 5–15)
BUN: 17 mg/dL (ref 6–20)
CO2: 27 mmol/L (ref 22–32)
Calcium: 8.9 mg/dL (ref 8.9–10.3)
Chloride: 106 mmol/L (ref 101–111)
Creatinine, Ser: 1.01 mg/dL — ABNORMAL HIGH (ref 0.44–1.00)
GFR, EST NON AFRICAN AMERICAN: 52 mL/min — AB (ref >60)
Glucose, Bld: 101 mg/dL — ABNORMAL HIGH (ref 65–99)
POTASSIUM: 3.2 mmol/L — AB (ref 3.5–5.1)
SODIUM: 136 mmol/L (ref 135–145)

## 2015-02-18 LAB — SAMPLE TO BLOOD BANK

## 2015-02-18 LAB — PREPARE RBC (CROSSMATCH)

## 2015-02-18 MED ORDER — DIPHENHYDRAMINE HCL 50 MG/ML IJ SOLN
25.0000 mg | Freq: Once | INTRAMUSCULAR | Status: AC
  Administered 2015-02-18: 25 mg via INTRAVENOUS
  Filled 2015-02-18: qty 1

## 2015-02-18 MED ORDER — SODIUM CHLORIDE 0.9 % IV SOLN
250.0000 mL | Freq: Once | INTRAVENOUS | Status: AC
  Administered 2015-02-18: 250 mL via INTRAVENOUS
  Filled 2015-02-18: qty 250

## 2015-02-18 MED ORDER — ACETAMINOPHEN 325 MG PO TABS
650.0000 mg | ORAL_TABLET | Freq: Once | ORAL | Status: AC
  Administered 2015-02-18: 650 mg via ORAL
  Filled 2015-02-18: qty 2

## 2015-02-18 NOTE — Progress Notes (Signed)
Patient was evaluated in the office 3 days ago and received IVF for dizziness and headaches.  She felt better the next day but she also has stopped the Valtrex because they read that this med could cause headaches.

## 2015-02-19 ENCOUNTER — Telehealth: Payer: Self-pay | Admitting: *Deleted

## 2015-02-19 LAB — TYPE AND SCREEN
ABO/RH(D): O POS
ANTIBODY SCREEN: NEGATIVE
UNIT DIVISION: 0

## 2015-02-19 NOTE — Telephone Encounter (Signed)
Informed to continue to hold Lovenox,

## 2015-02-19 NOTE — Telephone Encounter (Signed)
Does she need to restart her Lovenox injections?

## 2015-02-19 NOTE — Telephone Encounter (Signed)
Spoke with patients husband to inform him to continue to hold Lovenox, will place follow up call to patient after labs are drawn on 9/16.

## 2015-02-19 NOTE — Telephone Encounter (Signed)
Platelets only 52. Continue to hold lovenox.

## 2015-02-20 ENCOUNTER — Other Ambulatory Visit: Payer: Medicare Other

## 2015-02-20 ENCOUNTER — Ambulatory Visit: Payer: Medicare Other | Admitting: Oncology

## 2015-02-21 NOTE — Progress Notes (Signed)
[place]  [phone] [phone]  ID: [name] OB: 1935-09-07  MR#: [id number]  EML#:[id number]  Patient Care Team: Adin Hector, MD as PCP - General (Internal Medicine)  CHIEF COMPLAINT:  Chief Complaint  Patient presents with  . AML    INTERVAL HISTORY: Patient returns to clinic today for further evaluation and consideration of blood transfusion. She is no longer at a skilled nursing facility and states her strength is improving. She denies any fevers. She has no neurologic complaints. She has a fair appetite. She denies any chest pain or shortness of breath. She has no nausea, vomiting, constipation, or diarrhea. Patient offers no further specific complaints.  REVIEW OF SYSTEMS:   Review of Systems  Constitutional: Positive for malaise/fatigue. Negative for fever.  Respiratory: Negative.   Cardiovascular: Negative.   Neurological: Positive for weakness.    As per HPI. Otherwise, a complete review of systems is negatve.  PAST MEDICAL HISTORY: Past Medical History  Diagnosis Date  . Atrial fibrillation   . Parkinson's disease   . Glaucoma   . Hypertension   . Hypothyroidism   . GERD (gastroesophageal reflux disease)   . High cholesterol   . Diabetes mellitus without complication   . CAD (coronary artery disease) of bypass graft   . History of appendectomy   . H/O: hysterectomy   . Leukemia     PAST SURGICAL HISTORY: Past Surgical History  Procedure Laterality Date  . Cardiac valve replacement    . Pacemaker insertion      FAMILY HISTORY Family History  Problem Relation Age of Onset  . CAD Other   . Heart disease Father        ADVANCED DIRECTIVES:    HEALTH MAINTENANCE: Social History  Substance Use Topics  . Smoking status: Former Smoker -- 1.00 packs/day for 10 years    Types: Cigarettes  . Smokeless tobacco: Not on file  . Alcohol Use: No     Colonoscopy:  PAP:  Bone density:  Lipid  panel:  Allergies  Allergen Reactions  . Hydrochlorothiazide Other (See Comments)    Reaction:  Worsening hypercalcaemia  . Lisinopril Other (See Comments) and Cough    Reaction:  Worsening renal insufficiency at 39m dose.       Current Outpatient Prescriptions  Medication Sig Dispense Refill  . acetaminophen (TYLENOL) 325 MG tablet Take 650 mg by mouth every 4 (four) hours as needed for mild pain or headache.    . brimonidine (ALPHAGAN) 0.15 % ophthalmic solution Place 1 drop into both eyes 2 (two) times daily.    . carbidopa-levodopa (SINEMET IR) 25-100 MG per tablet Take 1 tablet by mouth 4 (four) times daily. 120 tablet 11  . cholecalciferol (VITAMIN D) 1000 UNITS tablet Take 1,000 Units by mouth daily.    . ferrous sulfate 325 (65 FE) MG tablet Take 325 mg by mouth daily.     . furosemide (LASIX) 40 MG tablet Take 20 mg by mouth daily.    .Marland KitchenglipiZIDE (GLUCOTROL XL) 5 MG 24 hr tablet Take 5 mg by mouth daily with breakfast.    . latanoprost (XALATAN) 0.005 % ophthalmic solution Place 1 drop into both eyes at bedtime.    .Marland Kitchenlevofloxacin (LEVAQUIN) 500 MG tablet     . levothyroxine (SYNTHROID, LEVOTHROID) 112 MCG tablet Take 1 tablet (112 mcg total) by mouth daily before breakfast. 30 tablet 11  . losartan (COZAAR) 50 MG tablet Take 1 tablet (50 mg total)  by mouth daily. 30 tablet 11  . omeprazole (PRILOSEC) 40 MG capsule Take 40 mg by mouth 2 (two) times daily.    . posaconazole (NOXAFIL) 100 MG TBEC delayed-release tablet Take 300 mg by mouth daily.     . prochlorperazine (COMPAZINE) 10 MG tablet Take 1 tablet (10 mg total) by mouth every 6 (six) hours as needed (Nausea or vomiting). (Patient taking differently: Take 10 mg by mouth every 6 (six) hours as needed for nausea or vomiting. ) 30 tablet 1  . sotalol (BETAPACE) 80 MG tablet Take 40 mg by mouth.    . vitamin B-12 (CYANOCOBALAMIN) 250 MCG tablet Take 250 mcg by mouth daily.    Marland Kitchen enoxaparin (LOVENOX) 150 MG/ML injection Inject  0.33 mLs (50 mg total) into the skin every 12 (twelve) hours. (Patient not taking: Reported on 02/18/2015) 60 Syringe 11  . valACYclovir (VALTREX) 500 MG tablet Take 500 mg by mouth daily.     No current facility-administered medications for this visit.   Facility-Administered Medications Ordered in Other Visits  Medication Dose Route Frequency Provider Last Rate Last Dose  . 0.9 %  sodium chloride infusion   Intravenous Continuous Lloyd Huger, MD 999 mL/hr at 10/29/14 1550      OBJECTIVE: Filed Vitals:   02/18/15 0953  BP: 163/64  Pulse: 87  Temp: 97.9 F (36.6 C)  Resp: 16     There is no weight on file to calculate BMI.    ECOG FS:2 - Symptomatic, <50% confined to bed  General: Well-developed, well-nourished, no acute distress. Eyes: anicteric sclera. Lungs: Clear to auscultation bilaterally. Heart: Regular rate and rhythm. No rubs, murmurs, or gallops. Abdomen: Soft, nontender, nondistended. No organomegaly noted, normoactive bowel sounds. Musculoskeletal: No edema, cyanosis, or clubbing. Neuro: Alert, answering all questions appropriately. Cranial nerves grossly intact. Skin: No rashes or petechiae noted. Psych: Normal affect.   LAB RESULTS:  Lab Results  Component Value Date   NA 136 02/18/2015   K 3.2* 02/18/2015   CL 106 02/18/2015   CO2 27 02/18/2015   GLUCOSE 101* 02/18/2015   BUN 17 02/18/2015   CREATININE 1.01* 02/18/2015   CALCIUM 8.9 02/18/2015   PROT 5.9* 01/03/2015   ALBUMIN 3.0* 01/03/2015   AST 9* 01/03/2015   ALT <5* 01/03/2015   ALKPHOS 62 01/03/2015   BILITOT 0.4 01/03/2015   GFRNONAA 52* 02/18/2015   GFRAA >60 02/18/2015    Lab Results  Component Value Date   WBC 0.9* 02/18/2015   NEUTROABS 0.3* 02/18/2015   HGB 7.5* 02/18/2015   HCT 22.3* 02/18/2015   MCV 90.0 02/18/2015   PLT 52* 02/18/2015     STUDIES: Ct Head Wo Contrast  02/06/2015   CLINICAL DATA:  Headache and mild dizziness 4-5 days. History of leukemia.  EXAM: CT  HEAD WITHOUT CONTRAST  TECHNIQUE: Contiguous axial images were obtained from the base of the skull through the vertex without intravenous contrast.  COMPARISON:  09/08/2013  FINDINGS: Ventricles, cisterns and other CSF spaces are within normal. There is evidence of 2 small old high right frontal and parietal infarcts unchanged. There is mild chronic ischemic microvascular disease. Small old infarct over the right basal ganglia/internal capsule. No evidence of mass, mass effect, shift of midline structures or acute hemorrhage. No evidence to suggest acute infarction. Minimal bilateral basal ganglia calcifications are present.  Remaining bones and soft tissues are within normal.  IMPRESSION: No acute intracranial findings.  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  Electronically Signed   By: Marin Olp M.D.   On: 02/06/2015 13:35    ASSESSMENT: AML  PLAN:    1. AML: Patient continues to decline additional treatment at this time. Her strength is improving and she is not ready to enroll in hospice either. No intervention or treatment is planned. Patient will return weekly for laboratory work and then in 3 weeks for further evaluation and consideration of additional blood. Of note, her most recent bone marrow biopsy was improved, but still contained AML at approximately 19% of her sample. She completed 4 cycles of azacitidine 75 mg/m IV. She will receive this treatment on days 1 through 5 as well as on day 8 and 9.  2. Thrombocytopenia: Likely secondary to progressive disease. Continue to hold Lovenox. 3. Anemia: One unit of packed red blood cells today. 4. Neutropenia: Likely secondary to progressive disease, monitor.   Patient expressed understanding and was in agreement with this plan. She also understands that She can call clinic at any time with any questions, concerns, or complaints.   Lloyd Huger, MD   02/21/2015 9:57 AM

## 2015-02-25 ENCOUNTER — Other Ambulatory Visit: Payer: Self-pay | Admitting: Oncology

## 2015-02-25 ENCOUNTER — Other Ambulatory Visit: Payer: Self-pay | Admitting: *Deleted

## 2015-02-25 ENCOUNTER — Telehealth: Payer: Self-pay | Admitting: *Deleted

## 2015-02-25 ENCOUNTER — Inpatient Hospital Stay: Payer: Medicare Other

## 2015-02-25 DIAGNOSIS — C9200 Acute myeloblastic leukemia, not having achieved remission: Secondary | ICD-10-CM

## 2015-02-25 LAB — BASIC METABOLIC PANEL
Anion gap: 5 (ref 5–15)
BUN: 12 mg/dL (ref 6–20)
CHLORIDE: 103 mmol/L (ref 101–111)
CO2: 28 mmol/L (ref 22–32)
CREATININE: 1 mg/dL (ref 0.44–1.00)
Calcium: 8.8 mg/dL — ABNORMAL LOW (ref 8.9–10.3)
GFR calc non Af Amer: 53 mL/min — ABNORMAL LOW (ref >60)
GLUCOSE: 131 mg/dL — AB (ref 65–99)
Potassium: 2.9 mmol/L — CL (ref 3.5–5.1)
Sodium: 136 mmol/L (ref 135–145)

## 2015-02-25 LAB — CBC WITH DIFFERENTIAL/PLATELET
Basophils Absolute: 0 10*3/uL (ref 0–0.1)
Basophils Relative: 0 %
EOS ABS: 0 10*3/uL (ref 0–0.7)
Eosinophils Relative: 3 %
HEMATOCRIT: 23.4 % — AB (ref 35.0–47.0)
HEMOGLOBIN: 7.9 g/dL — AB (ref 12.0–16.0)
LYMPHS ABS: 0.4 10*3/uL — AB (ref 1.0–3.6)
Lymphocytes Relative: 61 %
MCH: 29.9 pg (ref 26.0–34.0)
MCHC: 33.7 g/dL (ref 32.0–36.0)
MCV: 88.6 fL (ref 80.0–100.0)
Monocytes Absolute: 0 10*3/uL — ABNORMAL LOW (ref 0.2–0.9)
Neutro Abs: 0.2 10*3/uL — ABNORMAL LOW (ref 1.4–6.5)
Platelets: 47 10*3/uL — ABNORMAL LOW (ref 150–440)
RBC: 2.64 MIL/uL — ABNORMAL LOW (ref 3.80–5.20)
RDW: 23.1 % — ABNORMAL HIGH (ref 11.5–14.5)
WBC: 0.7 10*3/uL — CL (ref 3.6–11.0)

## 2015-02-25 LAB — SAMPLE TO BLOOD BANK

## 2015-02-25 LAB — PREPARE RBC (CROSSMATCH)

## 2015-02-25 MED ORDER — HEPARIN SOD (PORK) LOCK FLUSH 100 UNIT/ML IV SOLN
250.0000 [IU] | INTRAVENOUS | Status: DC | PRN
Start: 2015-02-26 — End: 2015-02-25

## 2015-02-25 MED ORDER — SODIUM CHLORIDE 0.9 % IJ SOLN
10.0000 mL | INTRAMUSCULAR | Status: DC | PRN
  Filled 2015-02-25: qty 10

## 2015-02-25 MED ORDER — ACETAMINOPHEN 325 MG PO TABS: 650.0000 mg | ORAL_TABLET | Freq: Once | ORAL | Status: DC

## 2015-02-25 MED ORDER — SODIUM CHLORIDE 0.9 % IV SOLN
250.0000 mL | Freq: Once | INTRAVENOUS | Status: DC
  Filled 2015-02-25: qty 250

## 2015-02-25 MED ORDER — SODIUM CHLORIDE 0.9 % IJ SOLN
3.0000 mL | INTRAMUSCULAR | Status: DC | PRN
Start: 2015-02-26 — End: 2015-02-25
  Filled 2015-02-25: qty 10

## 2015-02-25 MED ORDER — DIPHENHYDRAMINE HCL 50 MG/ML IJ SOLN: 25.0000 mg | Freq: Once | INTRAMUSCULAR | Status: DC

## 2015-02-25 MED ORDER — POTASSIUM CHLORIDE CRYS ER 20 MEQ PO TBCR: 20.0000 meq | EXTENDED_RELEASE_TABLET | Freq: Two times a day (BID) | ORAL | Status: DC

## 2015-02-25 MED ORDER — HEPARIN SOD (PORK) LOCK FLUSH 100 UNIT/ML IV SOLN: 500.0000 [IU] | Freq: Every day | INTRAVENOUS | Status: DC | PRN

## 2015-02-25 NOTE — Telephone Encounter (Signed)
Lab called with critical K+ of 2.9, according to patients medication list she does not take any oral potassium.

## 2015-02-25 NOTE — Telephone Encounter (Signed)
K+ supplement escribed.

## 2015-02-26 ENCOUNTER — Inpatient Hospital Stay: Payer: Medicare Other

## 2015-02-26 VITALS — BP 158/84 | HR 59 | Temp 97.8°F | Resp 20

## 2015-02-26 DIAGNOSIS — C9200 Acute myeloblastic leukemia, not having achieved remission: Secondary | ICD-10-CM | POA: Diagnosis not present

## 2015-02-26 LAB — PREPARE RBC (CROSSMATCH)

## 2015-02-26 MED ORDER — SODIUM CHLORIDE 0.9 % IV SOLN
250.0000 mL | Freq: Once | INTRAVENOUS | Status: AC
  Administered 2015-02-26: 250 mL via INTRAVENOUS
  Filled 2015-02-26: qty 250

## 2015-02-26 MED ORDER — SODIUM CHLORIDE 0.9 % IJ SOLN
3.0000 mL | INTRAMUSCULAR | Status: DC | PRN
  Filled 2015-02-26: qty 10

## 2015-02-26 MED ORDER — HEPARIN SOD (PORK) LOCK FLUSH 100 UNIT/ML IV SOLN: 500.0000 [IU] | Freq: Every day | INTRAVENOUS | Status: DC | PRN

## 2015-02-26 MED ORDER — DIPHENHYDRAMINE HCL 50 MG/ML IJ SOLN
25.0000 mg | Freq: Once | INTRAMUSCULAR | Status: AC
  Administered 2015-02-26: 25 mg via INTRAVENOUS
  Filled 2015-02-26: qty 1

## 2015-02-26 MED ORDER — ACETAMINOPHEN 325 MG PO TABS
650.0000 mg | ORAL_TABLET | Freq: Once | ORAL | Status: AC
  Administered 2015-02-26: 650 mg via ORAL
  Filled 2015-02-26: qty 2

## 2015-02-26 MED ORDER — SODIUM CHLORIDE 0.9 % IJ SOLN
10.0000 mL | INTRAMUSCULAR | Status: DC | PRN
  Filled 2015-02-26: qty 10

## 2015-02-26 MED ORDER — HEPARIN SOD (PORK) LOCK FLUSH 100 UNIT/ML IV SOLN: 250.0000 [IU] | INTRAVENOUS | Status: DC | PRN

## 2015-02-27 LAB — TYPE AND SCREEN
ABO/RH(D): O POS
Antibody Screen: NEGATIVE
Unit division: 0

## 2015-03-04 ENCOUNTER — Inpatient Hospital Stay: Payer: Medicare Other

## 2015-03-04 DIAGNOSIS — C9200 Acute myeloblastic leukemia, not having achieved remission: Secondary | ICD-10-CM | POA: Diagnosis not present

## 2015-03-04 LAB — CBC WITH DIFFERENTIAL/PLATELET
Basophils Absolute: 0 10*3/uL (ref 0–0.1)
Basophils Relative: 0 %
EOS ABS: 0 10*3/uL (ref 0–0.7)
HCT: 28 % — ABNORMAL LOW (ref 35.0–47.0)
Hemoglobin: 9.4 g/dL — ABNORMAL LOW (ref 12.0–16.0)
LYMPHS ABS: 0.5 10*3/uL — AB (ref 1.0–3.6)
MCH: 29.7 pg (ref 26.0–34.0)
MCHC: 33.6 g/dL (ref 32.0–36.0)
MCV: 88.3 fL (ref 80.0–100.0)
MONO ABS: 0 10*3/uL — AB (ref 0.2–0.9)
Monocytes Relative: 2 %
Neutro Abs: 0.2 10*3/uL — ABNORMAL LOW (ref 1.4–6.5)
Neutrophils Relative %: 29 %
PLATELETS: 50 10*3/uL — AB (ref 150–440)
RBC: 3.16 MIL/uL — AB (ref 3.80–5.20)
RDW: 20.4 % — ABNORMAL HIGH (ref 11.5–14.5)
WBC: 0.7 10*3/uL — AB (ref 3.6–11.0)

## 2015-03-04 LAB — BASIC METABOLIC PANEL
Anion gap: 4 — ABNORMAL LOW (ref 5–15)
BUN: 10 mg/dL (ref 6–20)
CALCIUM: 9.3 mg/dL (ref 8.9–10.3)
CHLORIDE: 106 mmol/L (ref 101–111)
CO2: 26 mmol/L (ref 22–32)
CREATININE: 1.08 mg/dL — AB (ref 0.44–1.00)
GFR calc non Af Amer: 48 mL/min — ABNORMAL LOW (ref >60)
GFR, EST AFRICAN AMERICAN: 56 mL/min — AB (ref >60)
Glucose, Bld: 85 mg/dL (ref 65–99)
Potassium: 4.3 mmol/L (ref 3.5–5.1)
SODIUM: 136 mmol/L (ref 135–145)

## 2015-03-04 LAB — SAMPLE TO BLOOD BANK

## 2015-03-08 ENCOUNTER — Other Ambulatory Visit: Payer: Self-pay | Admitting: *Deleted

## 2015-03-08 DIAGNOSIS — C9200 Acute myeloblastic leukemia, not having achieved remission: Secondary | ICD-10-CM

## 2015-03-11 ENCOUNTER — Inpatient Hospital Stay (HOSPITAL_BASED_OUTPATIENT_CLINIC_OR_DEPARTMENT_OTHER): Payer: Medicare Other | Admitting: Oncology

## 2015-03-11 ENCOUNTER — Inpatient Hospital Stay: Payer: Medicare Other

## 2015-03-11 ENCOUNTER — Inpatient Hospital Stay: Payer: Medicare Other | Attending: Oncology

## 2015-03-11 VITALS — BP 171/76 | HR 60 | Temp 96.2°F | Resp 20 | Wt 99.2 lb

## 2015-03-11 DIAGNOSIS — I2510 Atherosclerotic heart disease of native coronary artery without angina pectoris: Secondary | ICD-10-CM | POA: Diagnosis not present

## 2015-03-11 DIAGNOSIS — I10 Essential (primary) hypertension: Secondary | ICD-10-CM | POA: Diagnosis not present

## 2015-03-11 DIAGNOSIS — C9200 Acute myeloblastic leukemia, not having achieved remission: Secondary | ICD-10-CM

## 2015-03-11 DIAGNOSIS — R5381 Other malaise: Secondary | ICD-10-CM | POA: Diagnosis not present

## 2015-03-11 DIAGNOSIS — I4891 Unspecified atrial fibrillation: Secondary | ICD-10-CM | POA: Diagnosis not present

## 2015-03-11 DIAGNOSIS — D649 Anemia, unspecified: Secondary | ICD-10-CM | POA: Insufficient documentation

## 2015-03-11 DIAGNOSIS — R5383 Other fatigue: Secondary | ICD-10-CM | POA: Insufficient documentation

## 2015-03-11 DIAGNOSIS — D709 Neutropenia, unspecified: Secondary | ICD-10-CM | POA: Insufficient documentation

## 2015-03-11 DIAGNOSIS — Z87891 Personal history of nicotine dependence: Secondary | ICD-10-CM | POA: Insufficient documentation

## 2015-03-11 DIAGNOSIS — E7800 Pure hypercholesterolemia, unspecified: Secondary | ICD-10-CM | POA: Diagnosis not present

## 2015-03-11 DIAGNOSIS — G20 Parkinson's disease: Secondary | ICD-10-CM | POA: Diagnosis not present

## 2015-03-11 DIAGNOSIS — E119 Type 2 diabetes mellitus without complications: Secondary | ICD-10-CM

## 2015-03-11 DIAGNOSIS — R531 Weakness: Secondary | ICD-10-CM | POA: Insufficient documentation

## 2015-03-11 DIAGNOSIS — D696 Thrombocytopenia, unspecified: Secondary | ICD-10-CM | POA: Insufficient documentation

## 2015-03-11 DIAGNOSIS — Z79899 Other long term (current) drug therapy: Secondary | ICD-10-CM | POA: Insufficient documentation

## 2015-03-11 DIAGNOSIS — E039 Hypothyroidism, unspecified: Secondary | ICD-10-CM | POA: Insufficient documentation

## 2015-03-11 LAB — CBC WITH DIFFERENTIAL/PLATELET
Basophils Absolute: 0 10*3/uL (ref 0–0.1)
Eosinophils Absolute: 0 10*3/uL (ref 0–0.7)
HEMATOCRIT: 27.9 % — AB (ref 35.0–47.0)
Hemoglobin: 9.3 g/dL — ABNORMAL LOW (ref 12.0–16.0)
Lymphocytes Relative: 69 %
Lymphs Abs: 0.6 10*3/uL — ABNORMAL LOW (ref 1.0–3.6)
MCH: 29.5 pg (ref 26.0–34.0)
MCHC: 33.5 g/dL (ref 32.0–36.0)
MCV: 88.1 fL (ref 80.0–100.0)
MONO ABS: 0 10*3/uL — AB (ref 0.2–0.9)
NEUTROS ABS: 0.2 10*3/uL — AB (ref 1.4–6.5)
Neutrophils Relative %: 25 %
PLATELETS: 59 10*3/uL — AB (ref 150–440)
RBC: 3.17 MIL/uL — ABNORMAL LOW (ref 3.80–5.20)
RDW: 20.9 % — AB (ref 11.5–14.5)
WBC: 0.9 10*3/uL — CL (ref 3.6–11.0)

## 2015-03-11 LAB — COMPREHENSIVE METABOLIC PANEL
ALBUMIN: 4 g/dL (ref 3.5–5.0)
ALT: <5 U/L — ABNORMAL LOW (ref 14–54)
ANION GAP: 4 — AB (ref 5–15)
AST: 11 U/L — ABNORMAL LOW (ref 15–41)
Alkaline Phosphatase: 79 U/L (ref 38–126)
BILIRUBIN TOTAL: 0.7 mg/dL (ref 0.3–1.2)
BUN: 18 mg/dL (ref 6–20)
CHLORIDE: 106 mmol/L (ref 101–111)
CO2: 24 mmol/L (ref 22–32)
Calcium: 9.5 mg/dL (ref 8.9–10.3)
Creatinine, Ser: 1.06 mg/dL — ABNORMAL HIGH (ref 0.44–1.00)
GFR calc Af Amer: 57 mL/min — ABNORMAL LOW (ref >60)
GFR, EST NON AFRICAN AMERICAN: 49 mL/min — AB (ref >60)
GLUCOSE: 68 mg/dL (ref 65–99)
POTASSIUM: 4.5 mmol/L (ref 3.5–5.1)
Sodium: 134 mmol/L — ABNORMAL LOW (ref 135–145)
TOTAL PROTEIN: 7.3 g/dL (ref 6.5–8.1)

## 2015-03-11 LAB — SAMPLE TO BLOOD BANK

## 2015-03-11 NOTE — Progress Notes (Signed)
Patient here today for ongoing follow up regarding AML, patient reports feeling weak. Patient's husband reports fall this weekend at home, scraped her back on bed. Patient is high fall risk, yellow bracelet given to patient and family educated on fall precautions while here in clinic.

## 2015-03-18 NOTE — Progress Notes (Signed)
[place]  Telephone:([phone] [phone]) [phone]  ID: [name] OB: February 13, 1936  MR#: [id number]  GGE#:[id number]  Patient Care Team: Adin Hector, MD as PCP - General (Internal Medicine)  CHIEF COMPLAINT:  Chief Complaint  Patient presents with  . AML    INTERVAL HISTORY: Patient returns to clinic today for further evaluation and consideration of blood transfusion. Her performance status continues to improve, but she is still weak and fatigued and not back to her baseline.  She denies any fevers. She has no neurologic complaints. She has a fair appetite. She denies any chest pain or shortness of breath. She has no nausea, vomiting, constipation, or diarrhea. Patient offers no further specific complaints.  REVIEW OF SYSTEMS:   Review of Systems  Constitutional: Positive for malaise/fatigue. Negative for fever.  Respiratory: Negative.   Cardiovascular: Negative.   Neurological: Positive for weakness.    As per HPI. Otherwise, a complete review of systems is negatve.  PAST MEDICAL HISTORY: Past Medical History  Diagnosis Date  . Atrial fibrillation   . Parkinson's disease   . Glaucoma   . Hypertension   . Hypothyroidism   . GERD (gastroesophageal reflux disease)   . High cholesterol   . Diabetes mellitus without complication   . CAD (coronary artery disease) of bypass graft   . History of appendectomy   . H/O: hysterectomy   . Leukemia     PAST SURGICAL HISTORY: Past Surgical History  Procedure Laterality Date  . Cardiac valve replacement    . Pacemaker insertion      FAMILY HISTORY Family History  Problem Relation Age of Onset  . CAD Other   . Heart disease Father        ADVANCED DIRECTIVES:    HEALTH MAINTENANCE: Social History  Substance Use Topics  . Smoking status: Former Smoker -- 1.00 packs/day for 10 years    Types: Cigarettes  . Smokeless tobacco: Not on file  . Alcohol Use: No      Colonoscopy:  PAP:  Bone density:  Lipid panel:  Allergies  Allergen Reactions  . Hydrochlorothiazide Other (See Comments)    Reaction:  Worsening hypercalcaemia  . Lisinopril Other (See Comments) and Cough    Reaction:  Worsening renal insufficiency at 44m dose.       Current Outpatient Prescriptions  Medication Sig Dispense Refill  . acetaminophen (TYLENOL) 325 MG tablet Take 650 mg by mouth every 4 (four) hours as needed for mild pain or headache.    . brimonidine (ALPHAGAN) 0.15 % ophthalmic solution Place 1 drop into both eyes 2 (two) times daily.    . carbidopa-levodopa (SINEMET IR) 25-100 MG per tablet Take 1 tablet by mouth 4 (four) times daily. 120 tablet 11  . cholecalciferol (VITAMIN D) 1000 UNITS tablet Take 1,000 Units by mouth daily.    . ferrous sulfate 325 (65 FE) MG tablet Take 325 mg by mouth daily.     . furosemide (LASIX) 40 MG tablet Take 20 mg by mouth daily.    .Marland KitchenglipiZIDE (GLUCOTROL XL) 5 MG 24 hr tablet Take 5 mg by mouth daily with breakfast.    . latanoprost (XALATAN) 0.005 % ophthalmic solution Place 1 drop into both eyes at bedtime.    .Marland Kitchenlevofloxacin (LEVAQUIN) 500 MG tablet     . levothyroxine (SYNTHROID, LEVOTHROID) 112 MCG tablet Take 1 tablet (112 mcg total) by mouth daily before breakfast. 30 tablet 11  . losartan (COZAAR) 50 MG tablet Take  1 tablet (50 mg total) by mouth daily. 30 tablet 11  . omeprazole (PRILOSEC) 40 MG capsule Take 40 mg by mouth 2 (two) times daily.    . posaconazole (NOXAFIL) 100 MG TBEC delayed-release tablet Take 300 mg by mouth daily.     . potassium chloride SA (K-DUR,KLOR-CON) 20 MEQ tablet Take 1 tablet (20 mEq total) by mouth 2 (two) times daily. 60 tablet 2  . prochlorperazine (COMPAZINE) 10 MG tablet Take 1 tablet (10 mg total) by mouth every 6 (six) hours as needed (Nausea or vomiting). (Patient taking differently: Take 10 mg by mouth every 6 (six) hours as needed for nausea or vomiting. ) 30 tablet 1  . sotalol  (BETAPACE) 80 MG tablet Take 40 mg by mouth.    . valACYclovir (VALTREX) 500 MG tablet Take 500 mg by mouth daily.    . vitamin B-12 (CYANOCOBALAMIN) 250 MCG tablet Take 250 mcg by mouth daily.    Marland Kitchen enoxaparin (LOVENOX) 150 MG/ML injection Inject 0.33 mLs (50 mg total) into the skin every 12 (twelve) hours. (Patient not taking: Reported on 03/11/2015) 60 Syringe 11   No current facility-administered medications for this visit.   Facility-Administered Medications Ordered in Other Visits  Medication Dose Route Frequency Provider Last Rate Last Dose  . 0.9 %  sodium chloride infusion   Intravenous Continuous Lloyd Huger, MD 999 mL/hr at 10/29/14 1550      OBJECTIVE: Filed Vitals:   03/11/15 0930  BP: 171/76  Pulse: 60  Temp: 96.2 F (35.7 C)  Resp: 20     Body mass index is 17.58 kg/(m^2).    ECOG FS:2 - Symptomatic, <50% confined to bed  General: Well-developed, well-nourished, no acute distress. Eyes: anicteric sclera. Lungs: Clear to auscultation bilaterally. Heart: Regular rate and rhythm. No rubs, murmurs, or gallops. Abdomen: Soft, nontender, nondistended. No organomegaly noted, normoactive bowel sounds. Musculoskeletal: No edema, cyanosis, or clubbing. Neuro: Alert, answering all questions appropriately. Cranial nerves grossly intact. Skin: No rashes or petechiae noted. Psych: Normal affect.   LAB RESULTS:  Lab Results  Component Value Date   NA 134* 03/11/2015   K 4.5 03/11/2015   CL 106 03/11/2015   CO2 24 03/11/2015   GLUCOSE 68 03/11/2015   BUN 18 03/11/2015   CREATININE 1.06* 03/11/2015   CALCIUM 9.5 03/11/2015   PROT 7.3 03/11/2015   ALBUMIN 4.0 03/11/2015   AST 11* 03/11/2015   ALT <5* 03/11/2015   ALKPHOS 79 03/11/2015   BILITOT 0.7 03/11/2015   GFRNONAA 49* 03/11/2015   GFRAA 57* 03/11/2015    Lab Results  Component Value Date   WBC 0.9* 03/11/2015   NEUTROABS 0.2* 03/11/2015   HGB 9.3* 03/11/2015   HCT 27.9* 03/11/2015   MCV 88.1  03/11/2015   PLT 59* 03/11/2015     STUDIES: No results found.  ASSESSMENT: AML  PLAN:    1. AML: Patient continues to decline additional treatment at this time. Her strength is improving and she is not ready to enroll in hospice either. No intervention or treatment is planned. Patient will return to clinic in 2 weeks for laboratory work and consideration of blood transfusion and then in 4 weeks for further evaluation.  Of note, her most recent bone marrow biopsy was improved, but still contained AML at approximately 19% of her sample. She completed 4 cycles of azacitidine 75 mg/m IV. If she reinitiate treatment, she will receive this treatment on days 1 through 5 as well as on day 8  and 9.  2. Thrombocytopenia: Likely secondary to progressive disease. Continue to hold Lovenox. 3. Anemia: Decreased but stable, patient does not require transfusion today. 4. Neutropenia: Likely secondary to progressive disease, monitor.   Patient expressed understanding and was in agreement with this plan. She also understands that She can call clinic at any time with any questions, concerns, or complaints.   Lloyd Huger, MD   03/18/2015 1:54 PM

## 2015-03-25 ENCOUNTER — Inpatient Hospital Stay: Payer: Medicare Other

## 2015-03-25 ENCOUNTER — Telehealth: Payer: Self-pay | Admitting: *Deleted

## 2015-03-25 VITALS — BP 155/75 | HR 64 | Temp 95.7°F | Resp 20

## 2015-03-25 DIAGNOSIS — C9200 Acute myeloblastic leukemia, not having achieved remission: Secondary | ICD-10-CM

## 2015-03-25 LAB — CBC WITH DIFFERENTIAL/PLATELET
Basophils Absolute: 0 10*3/uL (ref 0–0.1)
Basophils Relative: 0 %
EOS ABS: 0 10*3/uL (ref 0–0.7)
Eosinophils Relative: 3 %
HEMATOCRIT: 21.1 % — AB (ref 35.0–47.0)
HEMOGLOBIN: 7.1 g/dL — AB (ref 12.0–16.0)
LYMPHS ABS: 0.5 10*3/uL — AB (ref 1.0–3.6)
MCH: 30.2 pg (ref 26.0–34.0)
MCHC: 33.6 g/dL (ref 32.0–36.0)
MCV: 89.9 fL (ref 80.0–100.0)
Monocytes Absolute: 0 10*3/uL — ABNORMAL LOW (ref 0.2–0.9)
NEUTROS ABS: 0.1 10*3/uL — AB (ref 1.4–6.5)
Platelets: 42 10*3/uL — ABNORMAL LOW (ref 150–440)
RBC: 2.34 MIL/uL — AB (ref 3.80–5.20)
RDW: 23.2 % — ABNORMAL HIGH (ref 11.5–14.5)
WBC: 0.7 10*3/uL — AB (ref 3.6–11.0)

## 2015-03-25 LAB — PREPARE RBC (CROSSMATCH)

## 2015-03-25 LAB — SAMPLE TO BLOOD BANK

## 2015-03-25 MED ORDER — SODIUM CHLORIDE 0.9 % IV SOLN
250.0000 mL | Freq: Once | INTRAVENOUS | Status: AC
  Administered 2015-03-25: 250 mL via INTRAVENOUS
  Filled 2015-03-25: qty 250

## 2015-03-25 MED ORDER — DIPHENHYDRAMINE HCL 50 MG/ML IJ SOLN
25.0000 mg | Freq: Once | INTRAMUSCULAR | Status: AC
  Administered 2015-03-25: 25 mg via INTRAVENOUS
  Filled 2015-03-25: qty 1

## 2015-03-25 MED ORDER — ACETAMINOPHEN 325 MG PO TABS
650.0000 mg | ORAL_TABLET | Freq: Once | ORAL | Status: AC
  Administered 2015-03-25: 650 mg via ORAL
  Filled 2015-03-25: qty 2

## 2015-03-25 NOTE — Telephone Encounter (Signed)
ANC 0.1 / WBC 0.7

## 2015-03-26 LAB — TYPE AND SCREEN
ABO/RH(D): O POS
ANTIBODY SCREEN: NEGATIVE
Unit division: 0

## 2015-04-05 IMAGING — CR DG ABDOMEN 3V
1 series · 3 of 3 positions shown · non-contrast
Comparison: None.

CLINICAL DATA: Abd pain, vomiting

EXAM:
ABDOMEN SERIES

[Series 1: x chest ap · 0.14mm/px · 3 of 3 slices shown]
[im 1/3]
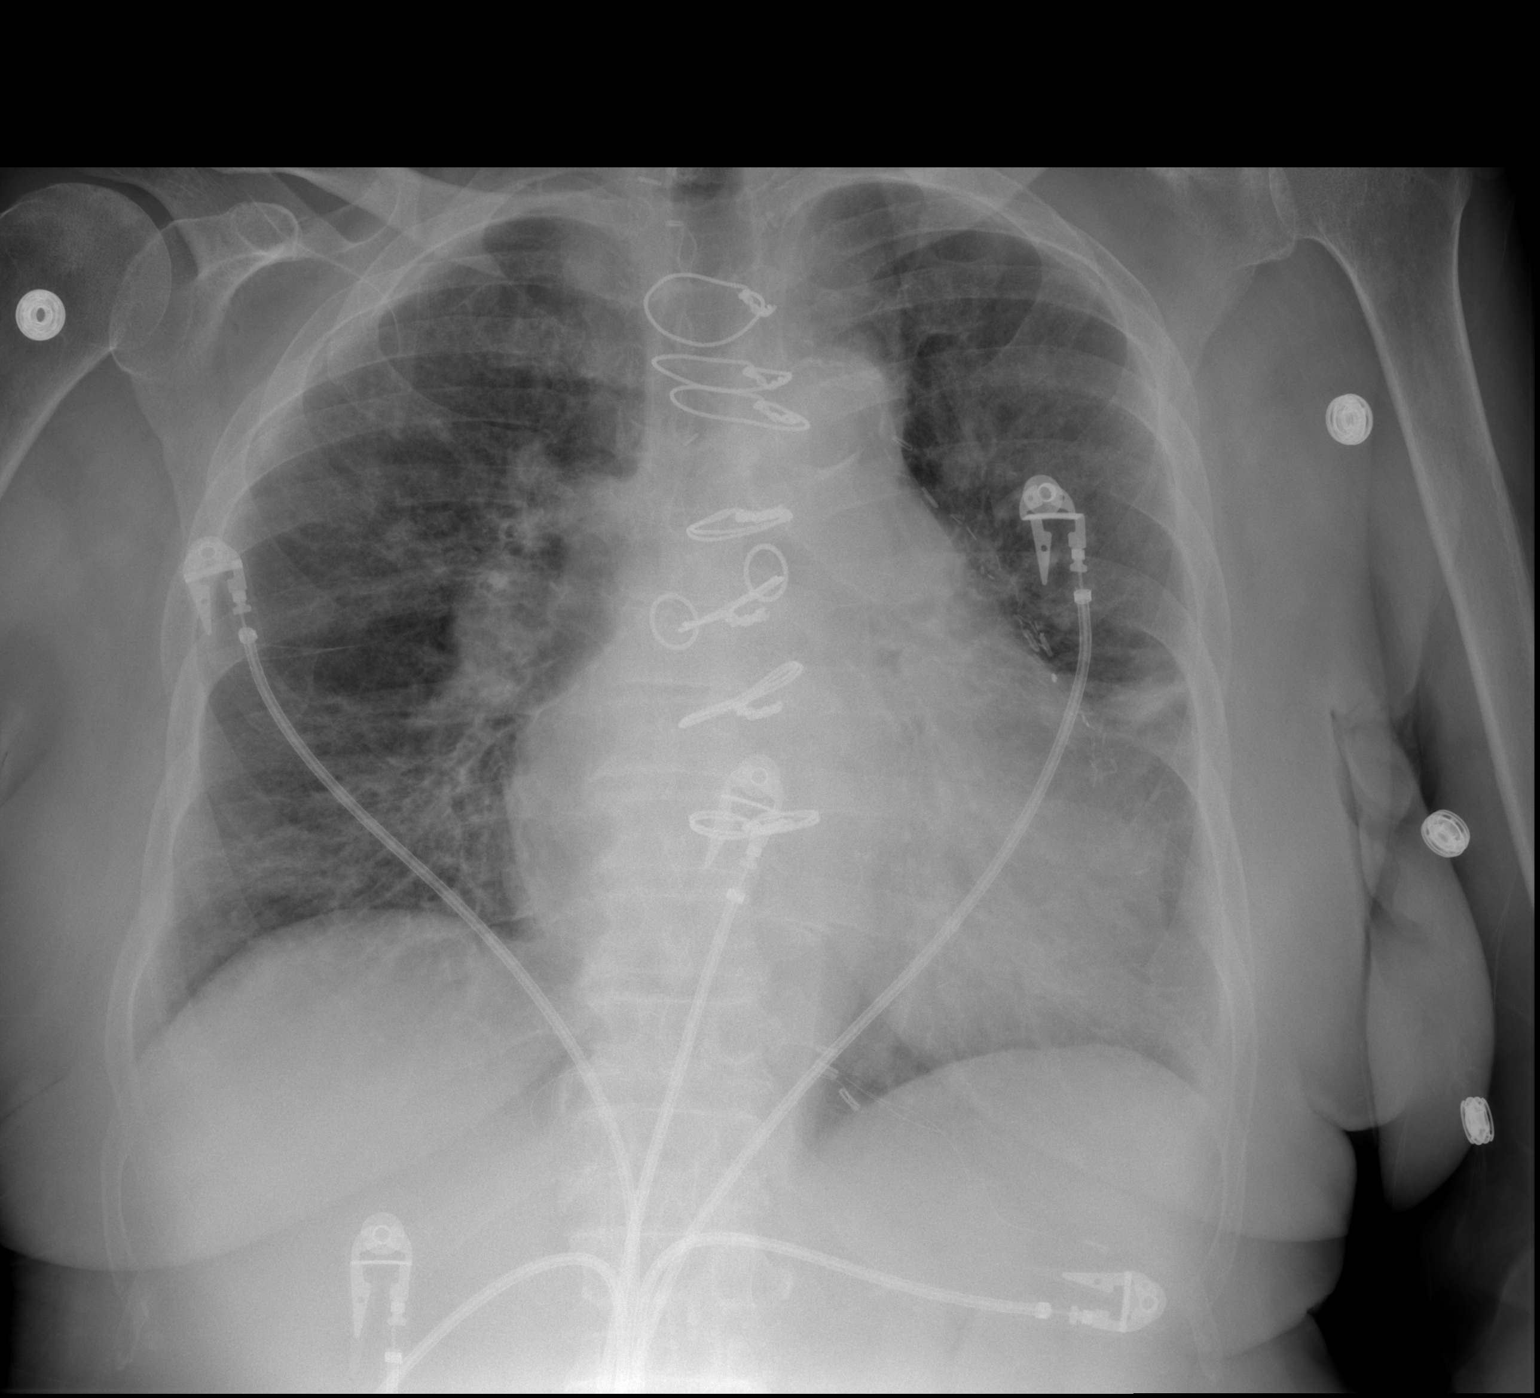
[im 2/3]
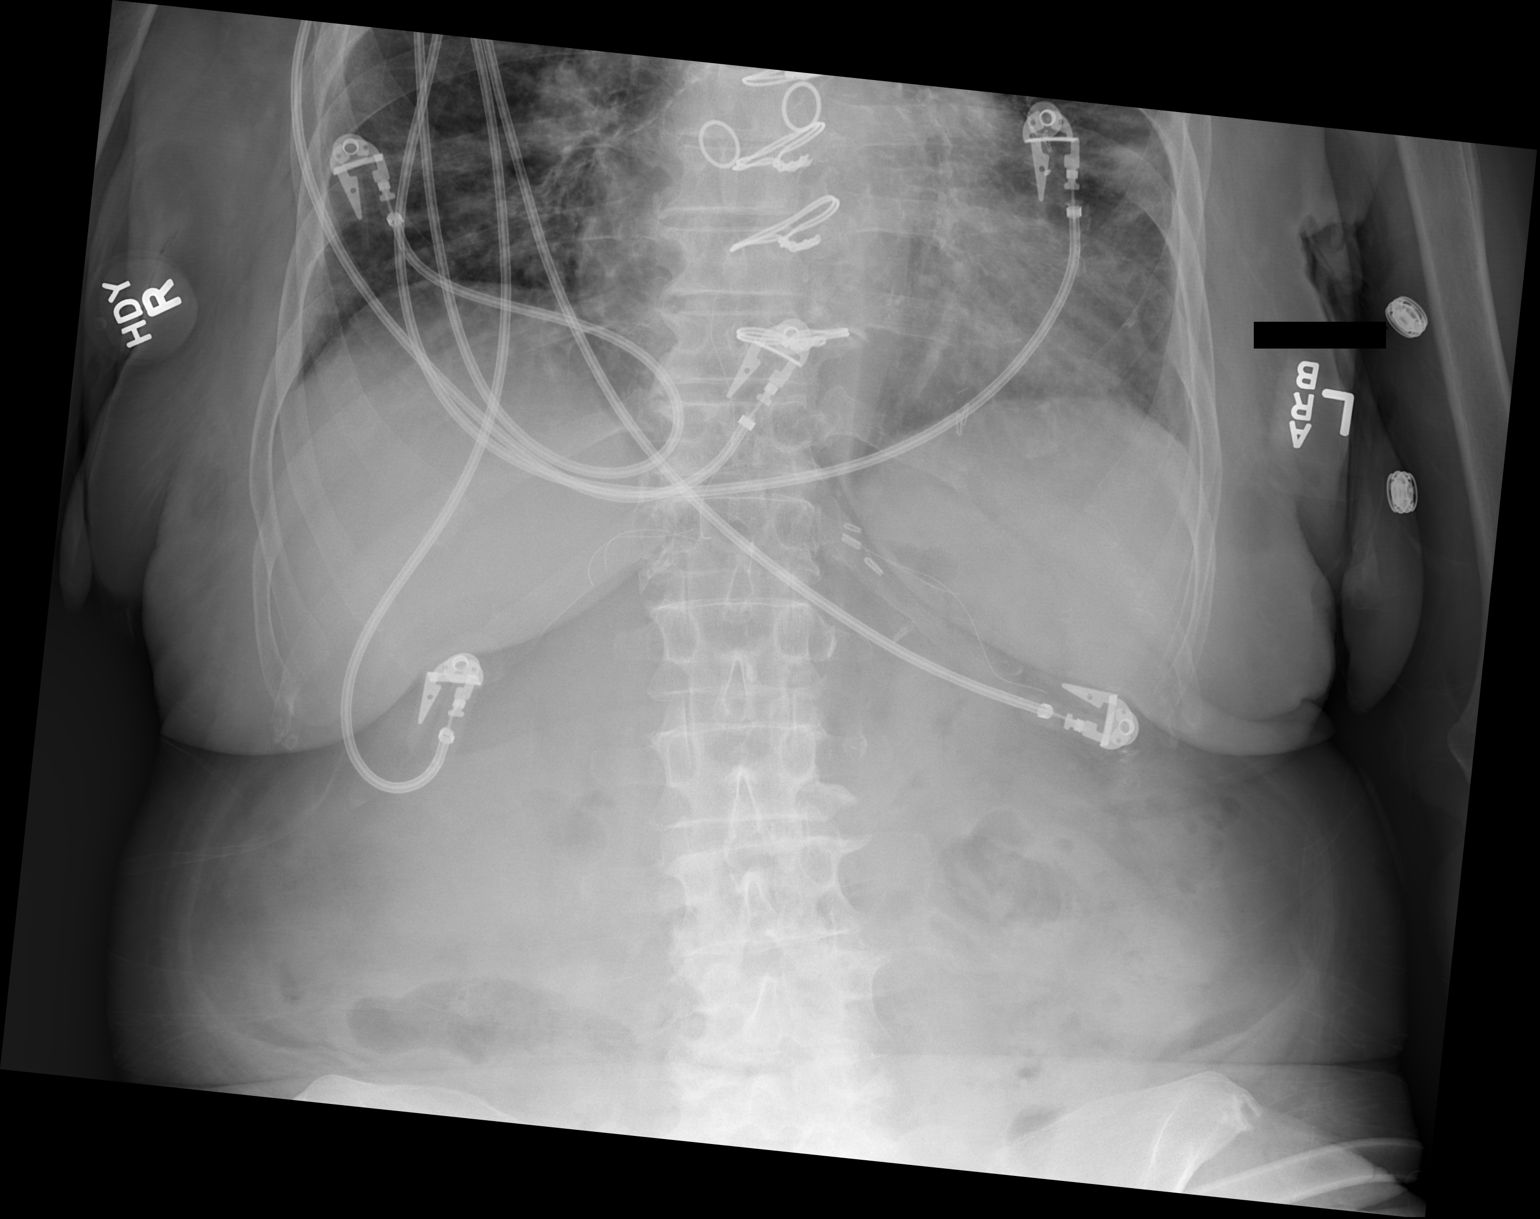
[im 3/3]
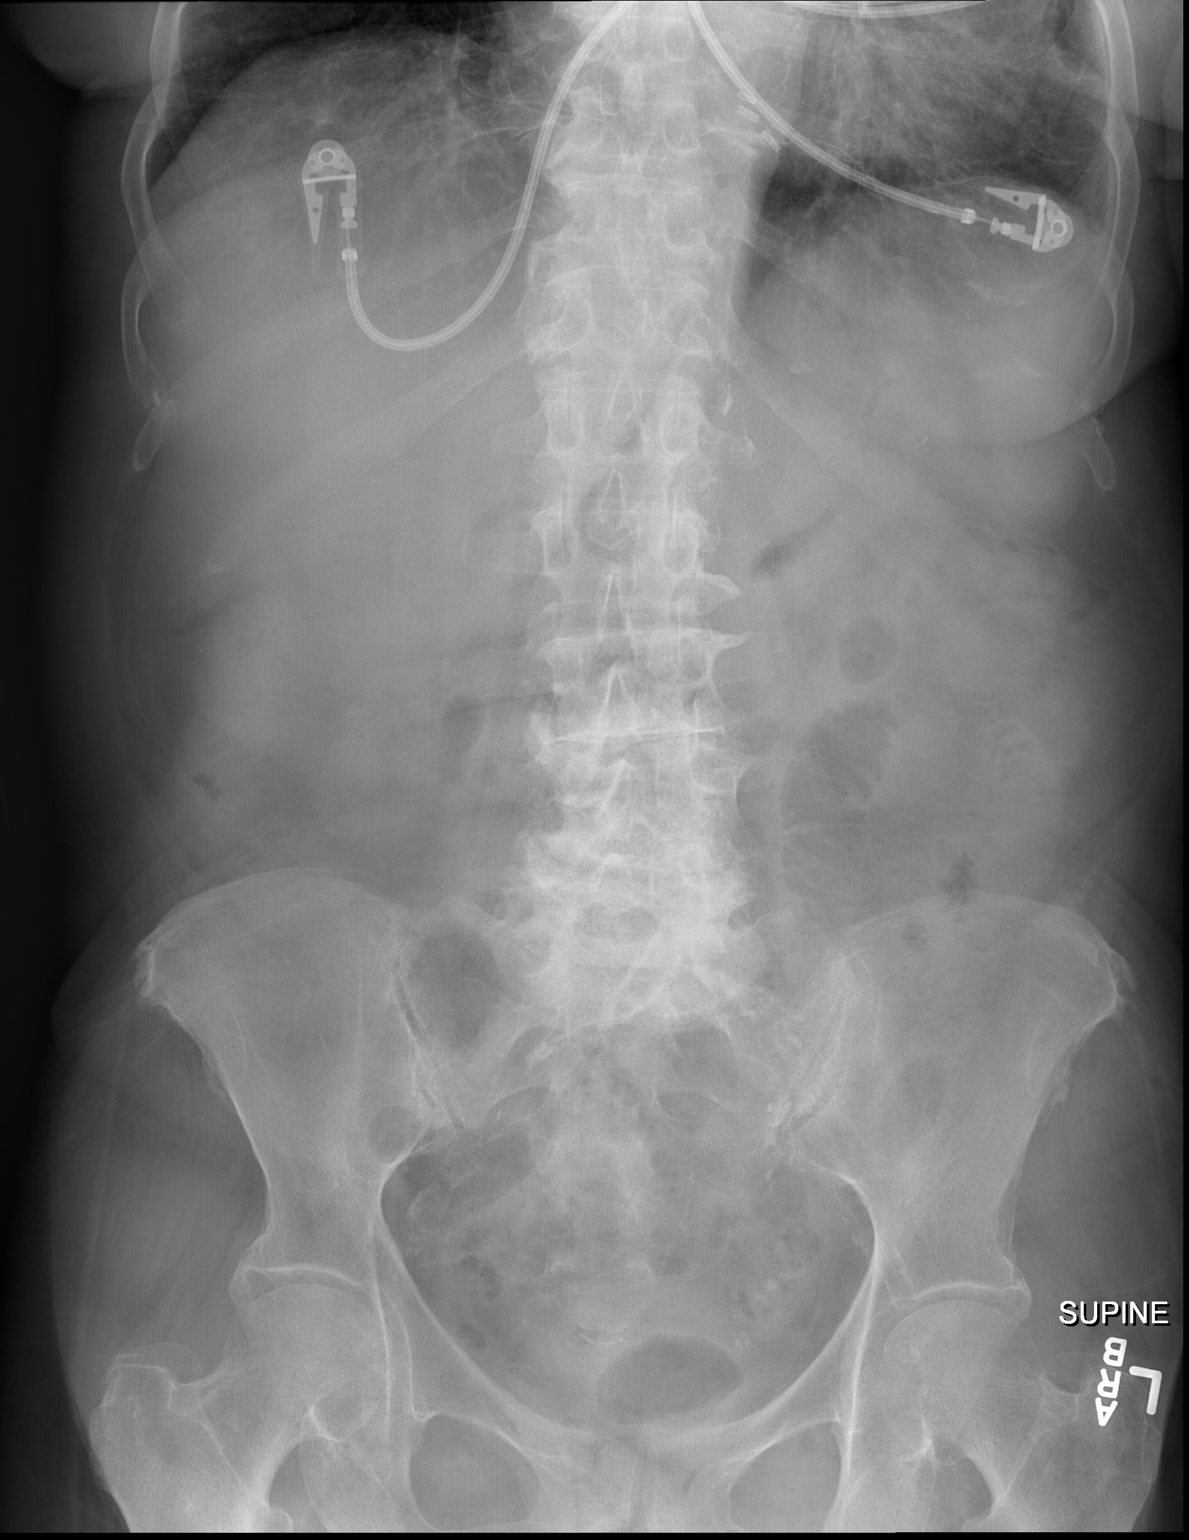

[3 of 3 positions shown; findings below may reference images not displayed]

FINDINGS: Low lung volumes. Cardiac silhouette enlarged. Patient is status
post median sternotomy coronary artery bypass grafting. There is
diffuse thickening of the interstitial markings and areas of mild
peribronchial cuffing. Linear density appreciated along the
periphery of the lingula.

Air is appreciated within nondilated loops of large small bowel.

Degenerative changes within the thoracic spine and sacroiliac
joints. Atherosclerotic calcifications are demonstrated.
IMPRESSION: Nonobstructive bowel gas pattern.

Interstitial infiltrate differential considerations are pulmonary
edema the underlying component of interstitial fibrosis

Atelectasis versus infiltrate within the lingula

Cardiomegaly

## 2015-04-05 IMAGING — CR DG CHEST 1V PORT
1 series · 1 of 1 positions shown · non-contrast
Comparison: September 08, 2013 and September 05, 2013

CLINICAL DATA: Central catheter placement

EXAM:
PORTABLE CHEST - 1 VIEW

[ap]
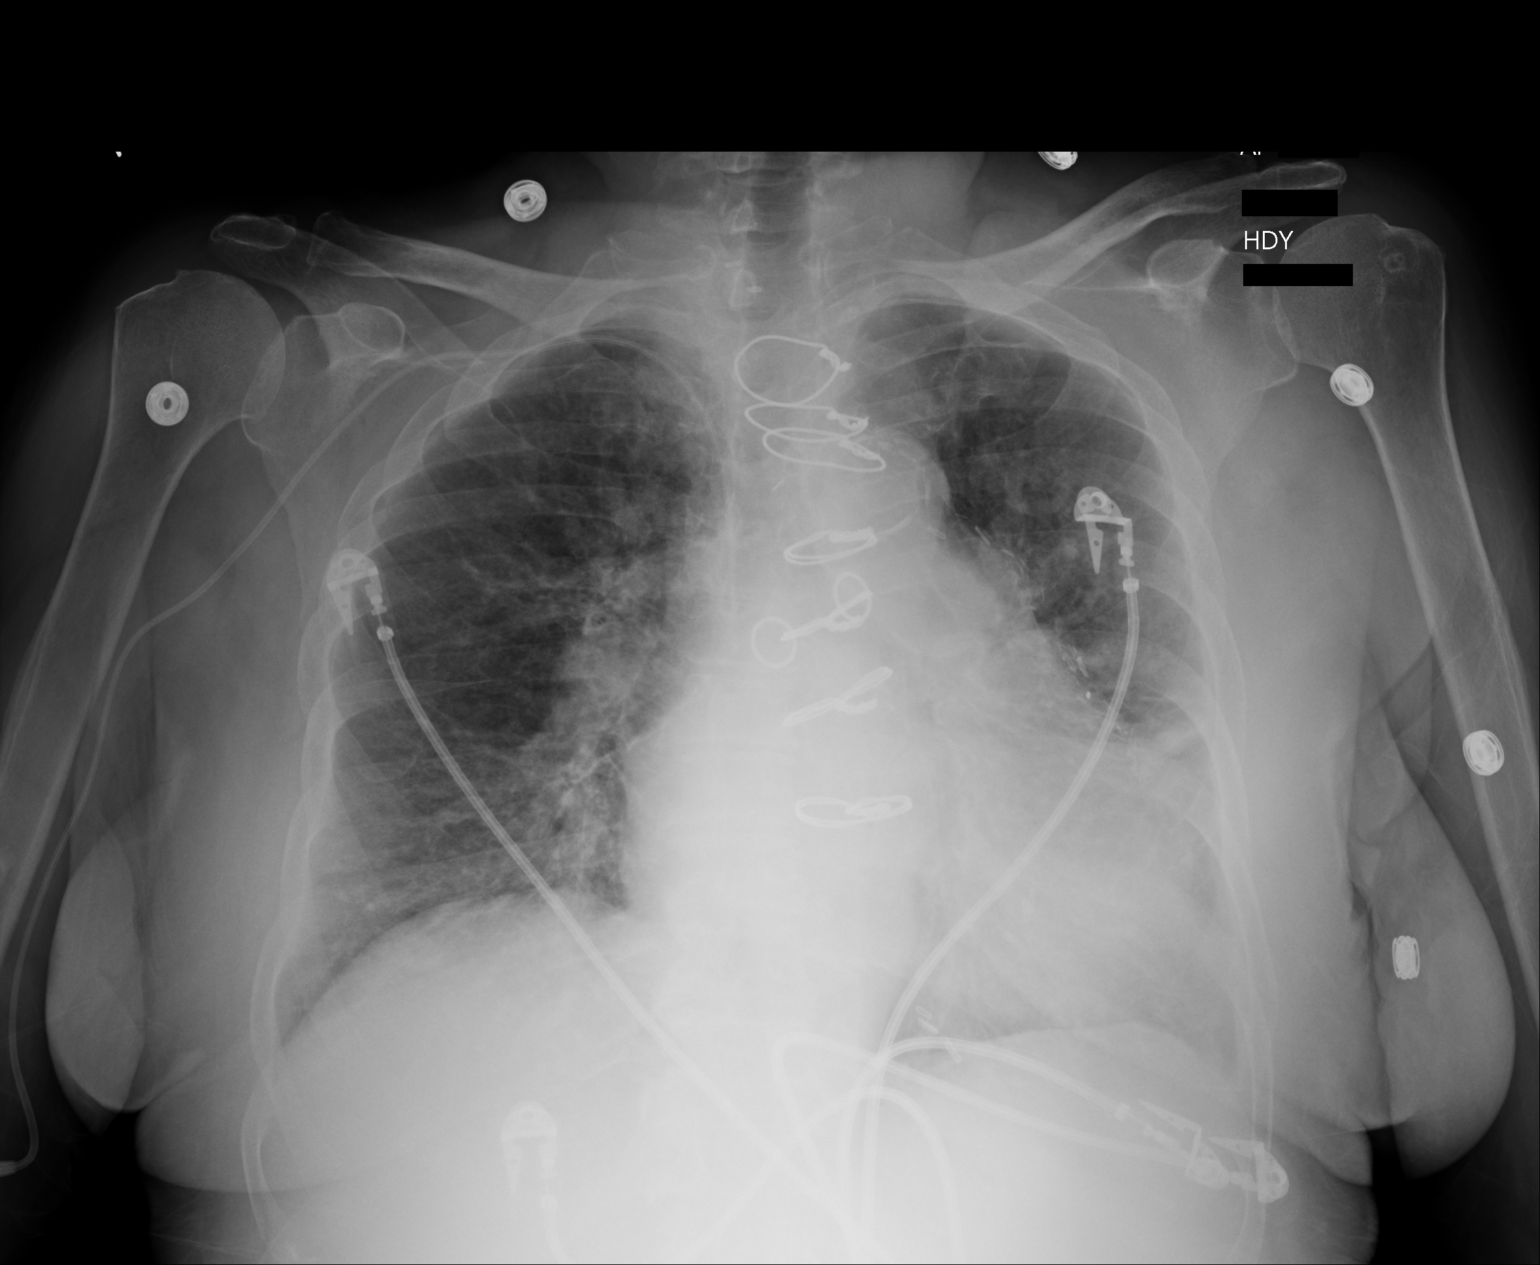

[1 of 1 positions shown; findings below may reference images not displayed]

FINDINGS: Central catheter tip is in the superior vena cava. No pneumothorax.
There is scarring in the left mid lung. There is no edema or
consolidation. There is central peribronchial thickening. Heart is
enlarged with normal pulmonary vascularity. No adenopathy. Patient
is status post coronary artery bypass grafting.
IMPRESSION: Central catheter tip in superior vena cava. No pneumothorax.
Scarring left mid lung. Evidence of chronic bronchitis. No
consolidation. Stable cardiac enlargement.

## 2015-04-08 ENCOUNTER — Inpatient Hospital Stay: Payer: Medicare Other

## 2015-04-08 ENCOUNTER — Inpatient Hospital Stay (HOSPITAL_BASED_OUTPATIENT_CLINIC_OR_DEPARTMENT_OTHER): Payer: Medicare Other | Admitting: Oncology

## 2015-04-08 VITALS — BP 176/74 | HR 60 | Temp 96.1°F | Resp 20

## 2015-04-08 VITALS — BP 161/70 | HR 60 | Temp 96.4°F | Resp 20 | Wt 110.5 lb

## 2015-04-08 DIAGNOSIS — D649 Anemia, unspecified: Secondary | ICD-10-CM

## 2015-04-08 DIAGNOSIS — R5381 Other malaise: Secondary | ICD-10-CM

## 2015-04-08 DIAGNOSIS — D709 Neutropenia, unspecified: Secondary | ICD-10-CM

## 2015-04-08 DIAGNOSIS — G20 Parkinson's disease: Secondary | ICD-10-CM

## 2015-04-08 DIAGNOSIS — C9200 Acute myeloblastic leukemia, not having achieved remission: Secondary | ICD-10-CM

## 2015-04-08 DIAGNOSIS — Z87891 Personal history of nicotine dependence: Secondary | ICD-10-CM

## 2015-04-08 DIAGNOSIS — D696 Thrombocytopenia, unspecified: Secondary | ICD-10-CM

## 2015-04-08 DIAGNOSIS — E119 Type 2 diabetes mellitus without complications: Secondary | ICD-10-CM

## 2015-04-08 DIAGNOSIS — R531 Weakness: Secondary | ICD-10-CM

## 2015-04-08 DIAGNOSIS — I10 Essential (primary) hypertension: Secondary | ICD-10-CM

## 2015-04-08 DIAGNOSIS — I2510 Atherosclerotic heart disease of native coronary artery without angina pectoris: Secondary | ICD-10-CM

## 2015-04-08 DIAGNOSIS — E039 Hypothyroidism, unspecified: Secondary | ICD-10-CM

## 2015-04-08 DIAGNOSIS — R5383 Other fatigue: Secondary | ICD-10-CM

## 2015-04-08 LAB — CBC WITH DIFFERENTIAL/PLATELET
Basophils Absolute: 0 10*3/uL (ref 0–0.1)
Basophils Relative: 0 %
Eosinophils Absolute: 0 10*3/uL (ref 0–0.7)
HCT: 22 % — ABNORMAL LOW (ref 35.0–47.0)
Hemoglobin: 7.2 g/dL — ABNORMAL LOW (ref 12.0–16.0)
LYMPHS ABS: 0.4 10*3/uL — AB (ref 1.0–3.6)
MCH: 30.3 pg (ref 26.0–34.0)
MCHC: 33 g/dL (ref 32.0–36.0)
MCV: 91.9 fL (ref 80.0–100.0)
MONO ABS: 0 10*3/uL — AB (ref 0.2–0.9)
Monocytes Relative: 4 %
Neutro Abs: 0.1 10*3/uL — ABNORMAL LOW (ref 1.4–6.5)
Neutrophils Relative %: 19 %
PLATELETS: 41 10*3/uL — AB (ref 150–440)
RBC: 2.39 MIL/uL — ABNORMAL LOW (ref 3.80–5.20)
RDW: 22.4 % — AB (ref 11.5–14.5)
WBC: 0.5 10*3/uL — CL (ref 3.6–11.0)

## 2015-04-08 LAB — PREPARE RBC (CROSSMATCH)

## 2015-04-08 MED ORDER — DIPHENHYDRAMINE HCL 50 MG/ML IJ SOLN
25.0000 mg | Freq: Once | INTRAMUSCULAR | Status: AC
  Administered 2015-04-08: 25 mg via INTRAVENOUS
  Filled 2015-04-08: qty 1

## 2015-04-08 MED ORDER — SODIUM CHLORIDE 0.9 % IV SOLN
INTRAVENOUS | Status: DC
  Administered 2015-04-08: 12:00:00 via INTRAVENOUS
  Filled 2015-04-08: qty 1000

## 2015-04-08 MED ORDER — ACETAMINOPHEN 325 MG PO TABS
650.0000 mg | ORAL_TABLET | Freq: Once | ORAL | Status: AC
  Administered 2015-04-08: 650 mg via ORAL
  Filled 2015-04-08: qty 2

## 2015-04-08 NOTE — Progress Notes (Signed)
Patient here today for ongoing follow up regarding AML> Patient denies any concerns today.

## 2015-04-08 NOTE — Progress Notes (Signed)
[place]  [phone] [phone]  ID: [name] OB: 1935-11-17  MR#: [id number]  ASN#:[id number]  Patient Care Team: Adin Hector, MD as PCP - General (Internal Medicine)  CHIEF COMPLAINT:  Chief Complaint  Patient presents with  . AML    follow up    INTERVAL HISTORY: Patient returns to clinic today for further evaluation and consideration of blood transfusion. Her performance status is stable.  She continues to have weakness and fatigue. She denies any fevers. She has no neurologic complaints. She has a fair appetite. She denies any chest pain or shortness of breath. She has no nausea, vomiting, constipation, or diarrhea. Patient offers no further specific complaints.  REVIEW OF SYSTEMS:   Review of Systems  Constitutional: Positive for malaise/fatigue. Negative for fever.  Respiratory: Negative.   Cardiovascular: Negative.   Neurological: Positive for weakness.  Endo/Heme/Allergies: Does not bruise/bleed easily.    As per HPI. Otherwise, a complete review of systems is negatve.  PAST MEDICAL HISTORY: Past Medical History  Diagnosis Date  . Atrial fibrillation   . Parkinson's disease   . Glaucoma   . Hypertension   . Hypothyroidism   . GERD (gastroesophageal reflux disease)   . High cholesterol   . Diabetes mellitus without complication   . CAD (coronary artery disease) of bypass graft   . History of appendectomy   . H/O: hysterectomy   . Leukemia     PAST SURGICAL HISTORY: Past Surgical History  Procedure Laterality Date  . Cardiac valve replacement    . Pacemaker insertion      FAMILY HISTORY Family History  Problem Relation Age of Onset  . CAD Other   . Heart disease Father        ADVANCED DIRECTIVES:    HEALTH MAINTENANCE: Social History  Substance Use Topics  . Smoking status: Former Smoker -- 1.00 packs/day for 10 years    Types: Cigarettes  . Smokeless tobacco: Not on file  . Alcohol  Use: No     Colonoscopy:  PAP:  Bone density:  Lipid panel:  Allergies  Allergen Reactions  . Hydrochlorothiazide Other (See Comments)    Reaction:  Worsening hypercalcaemia  . Lisinopril Other (See Comments) and Cough    Reaction:  Worsening renal insufficiency at 37m dose.       Current Outpatient Prescriptions  Medication Sig Dispense Refill  . acetaminophen (TYLENOL) 325 MG tablet Take 650 mg by mouth every 4 (four) hours as needed for mild pain or headache.    . brimonidine (ALPHAGAN) 0.15 % ophthalmic solution Place 1 drop into both eyes 2 (two) times daily.    . carbidopa-levodopa (SINEMET IR) 25-100 MG per tablet Take 1 tablet by mouth 4 (four) times daily. 120 tablet 11  . cholecalciferol (VITAMIN D) 1000 UNITS tablet Take 1,000 Units by mouth daily.    .Marland Kitchenenoxaparin (LOVENOX) 150 MG/ML injection Inject 0.33 mLs (50 mg total) into the skin every 12 (twelve) hours. 60 Syringe 11  . ferrous sulfate 325 (65 FE) MG tablet Take 325 mg by mouth daily.     . furosemide (LASIX) 40 MG tablet Take 20 mg by mouth daily.    .Marland KitchenglipiZIDE (GLUCOTROL XL) 5 MG 24 hr tablet Take 5 mg by mouth daily with breakfast.    . latanoprost (XALATAN) 0.005 % ophthalmic solution Place 1 drop into both eyes at bedtime.    .Marland Kitchenlevothyroxine (SYNTHROID, LEVOTHROID) 112 MCG tablet Take 1 tablet (112 mcg  total) by mouth daily before breakfast. 30 tablet 11  . losartan (COZAAR) 50 MG tablet Take 1 tablet (50 mg total) by mouth daily. 30 tablet 11  . omeprazole (PRILOSEC) 40 MG capsule Take 40 mg by mouth 2 (two) times daily.    . posaconazole (NOXAFIL) 100 MG TBEC delayed-release tablet Take 300 mg by mouth daily.     . potassium chloride SA (K-DUR,KLOR-CON) 20 MEQ tablet Take 1 tablet (20 mEq total) by mouth 2 (two) times daily. 60 tablet 2  . prochlorperazine (COMPAZINE) 10 MG tablet Take 1 tablet (10 mg total) by mouth every 6 (six) hours as needed (Nausea or vomiting). (Patient taking differently: Take 10  mg by mouth every 6 (six) hours as needed for nausea or vomiting. ) 30 tablet 1  . sotalol (BETAPACE) 80 MG tablet Take 40 mg by mouth.    . valACYclovir (VALTREX) 500 MG tablet Take 500 mg by mouth daily.    . vitamin B-12 (CYANOCOBALAMIN) 250 MCG tablet Take 250 mcg by mouth daily.    Marland Kitchen levofloxacin (LEVAQUIN) 500 MG tablet      No current facility-administered medications for this visit.   Facility-Administered Medications Ordered in Other Visits  Medication Dose Route Frequency Provider Last Rate Last Dose  . 0.9 %  sodium chloride infusion   Intravenous Continuous Lloyd Huger, MD 999 mL/hr at 10/29/14 1550    . 0.9 %  sodium chloride infusion   Intravenous Continuous Lloyd Huger, MD   Stopped at 04/08/15 1435    OBJECTIVE: Filed Vitals:   04/08/15 0920  BP: 161/70  Pulse: 60  Temp: 96.4 F (35.8 C)  Resp: 20     Body mass index is 19.57 kg/(m^2).    ECOG FS:2 - Symptomatic, <50% confined to bed  General: Well-developed, well-nourished, no acute distress. Eyes: anicteric sclera. Lungs: Clear to auscultation bilaterally. Heart: Regular rate and rhythm. No rubs, murmurs, or gallops. Abdomen: Soft, nontender, nondistended. No organomegaly noted, normoactive bowel sounds. Musculoskeletal: No edema, cyanosis, or clubbing. Neuro: Alert, answering all questions appropriately. Cranial nerves grossly intact. Skin: No rashes or petechiae noted. Psych: Normal affect.   LAB RESULTS:  Lab Results  Component Value Date   NA 134* 03/11/2015   K 4.5 03/11/2015   CL 106 03/11/2015   CO2 24 03/11/2015   GLUCOSE 68 03/11/2015   BUN 18 03/11/2015   CREATININE 1.06* 03/11/2015   CALCIUM 9.5 03/11/2015   PROT 7.3 03/11/2015   ALBUMIN 4.0 03/11/2015   AST 11* 03/11/2015   ALT <5* 03/11/2015   ALKPHOS 79 03/11/2015   BILITOT 0.7 03/11/2015   GFRNONAA 49* 03/11/2015   GFRAA 57* 03/11/2015    Lab Results  Component Value Date   WBC 0.5* 04/08/2015   NEUTROABS 0.1*  04/08/2015   HGB 7.2* 04/08/2015   HCT 22.0* 04/08/2015   MCV 91.9 04/08/2015   PLT 41* 04/08/2015     STUDIES: No results found.  ASSESSMENT: AML  PLAN:    1. AML: Patient continues to decline additional treatment at this time. Her strength good and she is not ready to enroll in hospice either. No intervention or treatment is planned. Patient will return to clinic in 2 weeks for laboratory work and consideration of blood transfusion and then in 4 weeks for further evaluation.  Of note, her most recent bone marrow biopsy was improved, but still contained AML at approximately 19% of her sample. She completed 4 cycles of azacitidine 75 mg/m IV in  June 2016. If she reinitiate treatment, she will receive this treatment on days 1 through 5 as well as on day 8 and 9.  2. Thrombocytopenia: Likely secondary to progressive disease. Continue to hold Lovenox. 3. Anemia: Decreased but stable, proceed with one unit pRBCs today. 4. Neutropenia: Likely secondary to progressive disease, monitor.   Patient expressed understanding and was in agreement with this plan. She also understands that She can call clinic at any time with any questions, concerns, or complaints.   Lloyd Huger, MD   04/08/2015 6:51 PM

## 2015-04-09 LAB — TYPE AND SCREEN
ABO/RH(D): O POS
Antibody Screen: NEGATIVE
UNIT DIVISION: 0

## 2015-04-12 ENCOUNTER — Other Ambulatory Visit: Payer: Self-pay | Admitting: "Endocrinology

## 2015-04-19 ENCOUNTER — Other Ambulatory Visit: Payer: Self-pay | Admitting: *Deleted

## 2015-04-19 DIAGNOSIS — C9200 Acute myeloblastic leukemia, not having achieved remission: Secondary | ICD-10-CM

## 2015-04-22 ENCOUNTER — Inpatient Hospital Stay: Payer: Medicare Other | Attending: Oncology

## 2015-04-22 ENCOUNTER — Telehealth: Payer: Self-pay | Admitting: *Deleted

## 2015-04-22 ENCOUNTER — Inpatient Hospital Stay: Payer: Medicare Other

## 2015-04-22 DIAGNOSIS — K219 Gastro-esophageal reflux disease without esophagitis: Secondary | ICD-10-CM | POA: Diagnosis not present

## 2015-04-22 DIAGNOSIS — R5381 Other malaise: Secondary | ICD-10-CM | POA: Insufficient documentation

## 2015-04-22 DIAGNOSIS — D696 Thrombocytopenia, unspecified: Secondary | ICD-10-CM | POA: Diagnosis not present

## 2015-04-22 DIAGNOSIS — R600 Localized edema: Secondary | ICD-10-CM | POA: Diagnosis not present

## 2015-04-22 DIAGNOSIS — Z87891 Personal history of nicotine dependence: Secondary | ICD-10-CM | POA: Diagnosis not present

## 2015-04-22 DIAGNOSIS — D649 Anemia, unspecified: Secondary | ICD-10-CM | POA: Insufficient documentation

## 2015-04-22 DIAGNOSIS — I4891 Unspecified atrial fibrillation: Secondary | ICD-10-CM | POA: Diagnosis not present

## 2015-04-22 DIAGNOSIS — E039 Hypothyroidism, unspecified: Secondary | ICD-10-CM | POA: Diagnosis not present

## 2015-04-22 DIAGNOSIS — G20 Parkinson's disease: Secondary | ICD-10-CM | POA: Diagnosis not present

## 2015-04-22 DIAGNOSIS — R531 Weakness: Secondary | ICD-10-CM | POA: Diagnosis not present

## 2015-04-22 DIAGNOSIS — R5383 Other fatigue: Secondary | ICD-10-CM | POA: Diagnosis not present

## 2015-04-22 DIAGNOSIS — E7800 Pure hypercholesterolemia, unspecified: Secondary | ICD-10-CM | POA: Diagnosis not present

## 2015-04-22 DIAGNOSIS — I10 Essential (primary) hypertension: Secondary | ICD-10-CM | POA: Diagnosis not present

## 2015-04-22 DIAGNOSIS — I2510 Atherosclerotic heart disease of native coronary artery without angina pectoris: Secondary | ICD-10-CM | POA: Insufficient documentation

## 2015-04-22 DIAGNOSIS — C9200 Acute myeloblastic leukemia, not having achieved remission: Secondary | ICD-10-CM | POA: Insufficient documentation

## 2015-04-22 DIAGNOSIS — Z90710 Acquired absence of both cervix and uterus: Secondary | ICD-10-CM | POA: Insufficient documentation

## 2015-04-22 DIAGNOSIS — E119 Type 2 diabetes mellitus without complications: Secondary | ICD-10-CM | POA: Diagnosis not present

## 2015-04-22 DIAGNOSIS — I25810 Atherosclerosis of coronary artery bypass graft(s) without angina pectoris: Secondary | ICD-10-CM | POA: Diagnosis not present

## 2015-04-22 DIAGNOSIS — D709 Neutropenia, unspecified: Secondary | ICD-10-CM | POA: Insufficient documentation

## 2015-04-22 LAB — CBC WITH DIFFERENTIAL/PLATELET
BASOS ABS: 0 10*3/uL (ref 0–0.1)
Eosinophils Absolute: 0 10*3/uL (ref 0–0.7)
Eosinophils Relative: 3 %
HCT: 22.5 % — ABNORMAL LOW (ref 35.0–47.0)
HEMOGLOBIN: 7.4 g/dL — AB (ref 12.0–16.0)
Lymphocytes Relative: 68 %
Lymphs Abs: 0.4 10*3/uL — ABNORMAL LOW (ref 1.0–3.6)
MCH: 30.8 pg (ref 26.0–34.0)
MCHC: 33.1 g/dL (ref 32.0–36.0)
MCV: 93 fL (ref 80.0–100.0)
Monocytes Absolute: 0 10*3/uL — ABNORMAL LOW (ref 0.2–0.9)
Monocytes Relative: 3 %
NEUTROS ABS: 0.2 10*3/uL — AB (ref 1.4–6.5)
Platelets: 62 10*3/uL — ABNORMAL LOW (ref 150–440)
RBC: 2.42 MIL/uL — ABNORMAL LOW (ref 3.80–5.20)
RDW: 22.3 % — ABNORMAL HIGH (ref 11.5–14.5)
WBC: 0.6 10*3/uL — AB (ref 3.6–11.0)

## 2015-04-22 LAB — SAMPLE TO BLOOD BANK

## 2015-04-22 MED ORDER — POTASSIUM CHLORIDE CRYS ER 20 MEQ PO TBCR: 20.0000 meq | EXTENDED_RELEASE_TABLET | Freq: Two times a day (BID) | ORAL | Status: DC

## 2015-04-22 NOTE — Telephone Encounter (Signed)
Escribed

## 2015-04-22 NOTE — Telephone Encounter (Signed)
Called to report ANC of 0.2. Dr Grayland Ormond informed

## 2015-04-23 MED ORDER — POTASSIUM CHLORIDE CRYS ER 20 MEQ PO TBCR: 20.0000 meq | EXTENDED_RELEASE_TABLET | Freq: Two times a day (BID) | ORAL | Status: DC

## 2015-04-23 NOTE — Addendum Note (Signed)
Addended by: Betti Cruz on: 04/23/2015 02:35 PM   Modules accepted: Orders

## 2015-04-25 ENCOUNTER — Other Ambulatory Visit: Payer: Self-pay | Admitting: Internal Medicine

## 2015-04-25 DIAGNOSIS — R600 Localized edema: Secondary | ICD-10-CM

## 2015-04-26 ENCOUNTER — Ambulatory Visit
Admission: RE | Admit: 2015-04-26 | Discharge: 2015-04-26 | Disposition: A | Discharge status: Home / Self Care | Payer: Medicare Other | Source: Ambulatory Visit | Attending: Internal Medicine | Admitting: Internal Medicine

## 2015-04-26 DIAGNOSIS — I70201 Unspecified atherosclerosis of native arteries of extremities, right leg: Secondary | ICD-10-CM | POA: Insufficient documentation

## 2015-04-26 DIAGNOSIS — R600 Localized edema: Secondary | ICD-10-CM | POA: Diagnosis present

## 2015-04-30 ENCOUNTER — Other Ambulatory Visit: Payer: Self-pay | Admitting: *Deleted

## 2015-04-30 DIAGNOSIS — C9200 Acute myeloblastic leukemia, not having achieved remission: Secondary | ICD-10-CM

## 2015-05-06 ENCOUNTER — Inpatient Hospital Stay: Payer: Medicare Other

## 2015-05-06 ENCOUNTER — Inpatient Hospital Stay (HOSPITAL_BASED_OUTPATIENT_CLINIC_OR_DEPARTMENT_OTHER): Payer: Medicare Other | Admitting: Oncology

## 2015-05-06 VITALS — BP 165/69 | HR 60 | Temp 96.9°F | Wt 113.3 lb

## 2015-05-06 DIAGNOSIS — E119 Type 2 diabetes mellitus without complications: Secondary | ICD-10-CM

## 2015-05-06 DIAGNOSIS — D696 Thrombocytopenia, unspecified: Secondary | ICD-10-CM

## 2015-05-06 DIAGNOSIS — R5381 Other malaise: Secondary | ICD-10-CM

## 2015-05-06 DIAGNOSIS — G20 Parkinson's disease: Secondary | ICD-10-CM

## 2015-05-06 DIAGNOSIS — I2510 Atherosclerotic heart disease of native coronary artery without angina pectoris: Secondary | ICD-10-CM

## 2015-05-06 DIAGNOSIS — D709 Neutropenia, unspecified: Secondary | ICD-10-CM

## 2015-05-06 DIAGNOSIS — D649 Anemia, unspecified: Secondary | ICD-10-CM

## 2015-05-06 DIAGNOSIS — R5383 Other fatigue: Secondary | ICD-10-CM

## 2015-05-06 DIAGNOSIS — C9200 Acute myeloblastic leukemia, not having achieved remission: Secondary | ICD-10-CM

## 2015-05-06 DIAGNOSIS — R600 Localized edema: Secondary | ICD-10-CM

## 2015-05-06 DIAGNOSIS — R531 Weakness: Secondary | ICD-10-CM

## 2015-05-06 DIAGNOSIS — I10 Essential (primary) hypertension: Secondary | ICD-10-CM

## 2015-05-06 LAB — CBC WITH DIFFERENTIAL/PLATELET
Basophils Absolute: 0 10*3/uL (ref 0–0.1)
Basophils Relative: 0 %
EOS ABS: 0 10*3/uL (ref 0–0.7)
Eosinophils Relative: 3 %
HCT: 21.2 % — ABNORMAL LOW (ref 35.0–47.0)
HEMOGLOBIN: 6.9 g/dL — AB (ref 12.0–16.0)
LYMPHS ABS: 0.4 10*3/uL — AB (ref 1.0–3.6)
MCH: 30.7 pg (ref 26.0–34.0)
MCHC: 32.3 g/dL (ref 32.0–36.0)
MCV: 95 fL (ref 80.0–100.0)
Monocytes Absolute: 0 10*3/uL — ABNORMAL LOW (ref 0.2–0.9)
Neutro Abs: 0.3 10*3/uL — ABNORMAL LOW (ref 1.4–6.5)
Platelets: 109 10*3/uL — ABNORMAL LOW (ref 150–440)
RBC: 2.23 MIL/uL — AB (ref 3.80–5.20)
RDW: 24 % — ABNORMAL HIGH (ref 11.5–14.5)
WBC: 0.7 10*3/uL — CL (ref 3.6–11.0)

## 2015-05-06 LAB — SAMPLE TO BLOOD BANK

## 2015-05-06 LAB — PREPARE RBC (CROSSMATCH)

## 2015-05-06 MED ORDER — DIPHENHYDRAMINE HCL 50 MG/ML IJ SOLN
INTRAMUSCULAR | Status: AC
  Filled 2015-05-06: qty 1

## 2015-05-06 MED ORDER — DIPHENHYDRAMINE HCL 25 MG PO CAPS
ORAL_CAPSULE | ORAL | Status: AC
  Filled 2015-05-06: qty 2

## 2015-05-06 MED ORDER — DIPHENHYDRAMINE HCL 50 MG/ML IJ SOLN: 25.0000 mg | Freq: Once | INTRAMUSCULAR | Status: DC

## 2015-05-06 MED ORDER — ACETAMINOPHEN 325 MG PO TABS: 650.0000 mg | ORAL_TABLET | Freq: Once | ORAL | Status: DC

## 2015-05-06 MED ORDER — ACETAMINOPHEN 325 MG PO TABS
ORAL_TABLET | ORAL | Status: AC
  Filled 2015-05-06: qty 2

## 2015-05-06 MED ORDER — SODIUM CHLORIDE 0.9 % IV SOLN
250.0000 mL | Freq: Once | INTRAVENOUS | Status: DC
  Filled 2015-05-06: qty 250

## 2015-05-06 NOTE — Progress Notes (Signed)
Patient IV infiltrated.  Three more attempts made to start IV with no success.  Dr. Grayland Ormond contacted and he stated that patient could come back on 05/07/15 to attempt the transfusion again.  Will talk to the patient about the possibility of a PICC line

## 2015-05-06 NOTE — Progress Notes (Signed)
Patient here today for ongoing follow up regarding AML> Patient denies any concerns today. 

## 2015-05-07 ENCOUNTER — Inpatient Hospital Stay: Payer: Medicare Other

## 2015-05-07 VITALS — BP 179/74 | HR 59 | Temp 96.4°F | Resp 20

## 2015-05-07 DIAGNOSIS — C9200 Acute myeloblastic leukemia, not having achieved remission: Secondary | ICD-10-CM | POA: Diagnosis not present

## 2015-05-07 MED ORDER — SODIUM CHLORIDE 0.9 % IV SOLN
250.0000 mL | Freq: Once | INTRAVENOUS | Status: AC
  Administered 2015-05-07: 250 mL via INTRAVENOUS
  Filled 2015-05-07: qty 250

## 2015-05-07 MED ORDER — ACETAMINOPHEN 325 MG PO TABS
650.0000 mg | ORAL_TABLET | Freq: Once | ORAL | Status: AC
  Administered 2015-05-07: 650 mg via ORAL
  Filled 2015-05-07: qty 2

## 2015-05-07 MED ORDER — DIPHENHYDRAMINE HCL 25 MG PO CAPS
ORAL_CAPSULE | ORAL | Status: AC
  Filled 2015-05-07: qty 1

## 2015-05-07 MED ORDER — DIPHENHYDRAMINE HCL 50 MG/ML IJ SOLN
25.0000 mg | Freq: Once | INTRAMUSCULAR | Status: DC
  Filled 2015-05-07: qty 1

## 2015-05-08 LAB — TYPE AND SCREEN
ABO/RH(D): O POS
ANTIBODY SCREEN: NEGATIVE
UNIT DIVISION: 0

## 2015-05-09 ENCOUNTER — Telehealth: Payer: Self-pay

## 2015-05-09 NOTE — Telephone Encounter (Signed)
[facility]/15 @ 9:00 for PICC line placement procedure @ 9:45.

## 2015-05-10 ENCOUNTER — Telehealth: Payer: Self-pay | Admitting: *Deleted

## 2015-05-10 ENCOUNTER — Encounter
Admission: RE | Disposition: A | Discharge status: Home / Self Care | Payer: Self-pay | Source: Ambulatory Visit | Attending: Vascular Surgery

## 2015-05-10 ENCOUNTER — Ambulatory Visit
Admission: RE | Admit: 2015-05-10 | Discharge: 2015-05-10 | Disposition: A | Discharge status: Home / Self Care | Payer: Medicare Other | Source: Ambulatory Visit | Attending: Vascular Surgery | Admitting: Vascular Surgery

## 2015-05-10 DIAGNOSIS — I2510 Atherosclerotic heart disease of native coronary artery without angina pectoris: Secondary | ICD-10-CM | POA: Diagnosis not present

## 2015-05-10 DIAGNOSIS — C9590 Leukemia, unspecified not having achieved remission: Secondary | ICD-10-CM | POA: Insufficient documentation

## 2015-05-10 DIAGNOSIS — K219 Gastro-esophageal reflux disease without esophagitis: Secondary | ICD-10-CM | POA: Insufficient documentation

## 2015-05-10 DIAGNOSIS — Z87891 Personal history of nicotine dependence: Secondary | ICD-10-CM | POA: Insufficient documentation

## 2015-05-10 DIAGNOSIS — E119 Type 2 diabetes mellitus without complications: Secondary | ICD-10-CM | POA: Diagnosis not present

## 2015-05-10 DIAGNOSIS — E039 Hypothyroidism, unspecified: Secondary | ICD-10-CM | POA: Diagnosis not present

## 2015-05-10 DIAGNOSIS — E7800 Pure hypercholesterolemia, unspecified: Secondary | ICD-10-CM | POA: Insufficient documentation

## 2015-05-10 DIAGNOSIS — G20 Parkinson's disease: Secondary | ICD-10-CM | POA: Insufficient documentation

## 2015-05-10 DIAGNOSIS — I4891 Unspecified atrial fibrillation: Secondary | ICD-10-CM | POA: Insufficient documentation

## 2015-05-10 DIAGNOSIS — I10 Essential (primary) hypertension: Secondary | ICD-10-CM | POA: Insufficient documentation

## 2015-05-10 DIAGNOSIS — Z9049 Acquired absence of other specified parts of digestive tract: Secondary | ICD-10-CM | POA: Insufficient documentation

## 2015-05-10 DIAGNOSIS — Z90710 Acquired absence of both cervix and uterus: Secondary | ICD-10-CM | POA: Insufficient documentation

## 2015-05-10 DIAGNOSIS — Z79899 Other long term (current) drug therapy: Secondary | ICD-10-CM | POA: Diagnosis not present

## 2015-05-10 DIAGNOSIS — H409 Unspecified glaucoma: Secondary | ICD-10-CM | POA: Insufficient documentation

## 2015-05-10 HISTORY — PX: PERIPHERAL VASCULAR CATHETERIZATION: SHX172C

## 2015-05-10 SURGERY — PICC LINE INSERTION
Anesthesia: Moderate Sedation

## 2015-05-10 MED ORDER — HEPARIN SOD (PORK) LOCK FLUSH 100 UNIT/ML IV SOLN
INTRAVENOUS | Status: AC
  Filled 2015-05-10: qty 5

## 2015-05-10 MED ORDER — LIDOCAINE HCL (PF) 1 % IJ SOLN
INTRAMUSCULAR | Status: DC | PRN
  Administered 2015-05-10: 5 mL via INTRADERMAL

## 2015-05-10 SURGICAL SUPPLY — 6 items
DRAPE BRACHIAL (DRAPES) ×3 IMPLANT
GAUZE SPONGE 4X4 12PLY STRL (GAUZE/BANDAGES/DRESSINGS) ×6 IMPLANT
NDL PERC 4X21 ACCESS (NEEDLE) ×3 IMPLANT
TOWEL OR 17X26 4PK STRL BLUE (TOWEL DISPOSABLE) ×3 IMPLANT
TRAY PICC SGL PWR INJ 4FR 55CM (TRAY / TRAY PROCEDURE) ×3 IMPLANT
WIRE FLEX T 018X145 (WIRE) ×3 IMPLANT

## 2015-05-10 NOTE — Discharge Instructions (Signed)
PICC Insertion, Care After °Refer to this sheet in the next few weeks. These instructions provide you with information on caring for yourself after your procedure. Your health care provider may also give you more specific instructions. Your treatment has been planned according to current medical practices, but problems sometimes occur. Call your health care provider if you have any problems or questions after your procedure. °WHAT TO EXPECT AFTER THE PROCEDURE °After your procedure, it is typical to have the following: °· Mild discomfort at the insertion site. This should not last more than a day. °HOME CARE INSTRUCTIONS °· Rest at home for the remainder of the day after the procedure. °· You may bend your arm and move it freely. If your PICC is near or at the bend of your elbow, avoid activity with repeated motion at the elbow. °· Avoid lifting heavy objects as instructed by your health care provider. °· Avoid using a crutch with the arm on the same side as your PICC. You may need to use a walker. °Bandage Care °· Keep your PICC bandage (dressing) clean and dry to prevent infection. °· Ask your health care provider when you may shower. To keep the dressing dry, cover the PICC with plastic wrap and tape before showering. If the dressing does become wet, replace it right after the shower. °· Do not soak in the bath, swim, or use hot tubs when you have a PICC. °· Change the PICC dressing as instructed by your health care provider. °· Change your PICC dressing if it becomes loose or wet. °General PICC Care °· Check the PICC insertion site daily for leakage, redness, swelling, or pain. °· Flush the PICC as directed by your health care provider. Let your health care provider know right away if the PICC is difficult to flush or does not flush. Do not use force to flush the PICC. °· Do not use a syringe that is less than 10 mL to flush the PICC. °· Never pull or tug on the PICC. °· Avoid blood pressure checks on the arm  with the PICC. °· Keep your PICC identification card with you at all times. °· Do not take the PICC out yourself. Only a trained health care professional should remove the PICC. °SEEK MEDICAL CARE IF: °· You have pain in your arm, ear, face, or teeth. °· You have fever or chills. °· You have drainage from the PICC insertion site. °· You have redness or palpate a "cord" around the PICC insertion site. °· You cannot flush the catheter. °SEEK IMMEDIATE MEDICAL CARE IF: °· You have swelling in the arm in which the PICC is inserted. °  °This information is not intended to replace advice given to you by your health care provider. Make sure you discuss any questions you have with your health care provider. °  °Document Released: 03/15/2013 Document Revised: 05/30/2013 Document Reviewed: 03/15/2013 °Elsevier Interactive Patient Education ©2016 [employer] ° °

## 2015-05-10 NOTE — Telephone Encounter (Signed)
Referral form sent to [facility] care.

## 2015-05-10 NOTE — Telephone Encounter (Signed)
Yes, we can do that.  Thank you.

## 2015-05-10 NOTE — Telephone Encounter (Signed)
Patient had PICC line placed today and is asking for Home Health Referral to be made for care of it. Please call pt at [phone]

## 2015-05-10 NOTE — H&P (Signed)
[facility] Admission History & Physical  MRN : [id number]  [name] is a 79 y.o. (10-02-1935) female who presents with chief complaint of No chief complaint on file. Marland Kitchen  History of Present Illness: Patient sent from [place] needing a PICC line for adequate IV access she has leukemia and is undergoing treatment. PICC line is being utilized since she has had to infected ports  Current Facility-Administered Medications  Medication Dose Route Frequency Provider Last Rate Last Dose  . heparin lock flush 100 UNIT/ML injection            Facility-Administered Medications Ordered in Other Encounters  Medication Dose Route Frequency Provider Last Rate Last Dose  . 0.9 %  sodium chloride infusion   Intravenous Continuous Lloyd Huger, MD 999 mL/hr at 10/29/14 1550      Past Medical History  Diagnosis Date  . Atrial fibrillation   . Parkinson's disease   . Glaucoma   . Hypertension   . Hypothyroidism   . GERD (gastroesophageal reflux disease)   . High cholesterol   . Diabetes mellitus without complication   . CAD (coronary artery disease) of bypass graft   . History of appendectomy   . H/O: hysterectomy   . Leukemia     Past Surgical History  Procedure Laterality Date  . Cardiac valve replacement    . Pacemaker insertion      Social History Social History  Substance Use Topics  . Smoking status: Former Smoker -- 1.00 packs/day for 10 years    Types: Cigarettes  . Smokeless tobacco: Not on file  . Alcohol Use: No    Family History Family History  Problem Relation Age of Onset  . CAD Other   . Heart disease Father     Allergies  Allergen Reactions  . Hydrochlorothiazide Other (See Comments)    Reaction:  Worsening hypercalcaemia  . Lisinopril Other (See Comments) and Cough    Reaction:  Worsening renal insufficiency at 40mg  dose.         Physical Examination  Filed Vitals:   05/10/15 0915  BP: 118/56  Pulse: 60   Temp: 96 F (35.6 C)  Resp: 18  SpO2: 98%   There is no weight on file to calculate BMI. Gen: WD/WN, NAD Head: [place]/AT, Ear/Nose/Throat: Hearing grossly intact Eyes:  No scleral icterus  Neck: Supple, no nuchal rigidity.  Pulmonary:  no increased work of respiration or use of accessory muscles  Gastrointestinal: non-distended.  Musculoskeletal: M/S 5/5 throughout.  No deformity or atrophy. Neurologic: Pain and light touch intact in extremities.  Symmetrical.  Speech is fluent. Psychiatric: Judgment intact, Mood & affect appropriate for pt's clinical situation. Dermatologic: No rashes or ulcers noted.  No cellulitis or open wounds.   CBC Lab Results  Component Value Date   WBC 0.7* 05/06/2015   HGB 6.9* 05/06/2015   HCT 21.2* 05/06/2015   MCV 95.0 05/06/2015   PLT 109* 05/06/2015    BMET    Component Value Date/Time   NA 134* 03/11/2015 0850   NA 140 06/18/2014 0515   K 4.5 03/11/2015 0850   K 4.1 06/18/2014 0515   CL 106 03/11/2015 0850   CL 107 06/18/2014 0515   CO2 24 03/11/2015 0850   CO2 25 06/18/2014 0515   GLUCOSE 68 03/11/2015 0850   GLUCOSE 93 06/18/2014 0515   BUN 18 03/11/2015 0850   BUN 13 06/18/2014 0515   CREATININE 1.06* 03/11/2015 0850   CREATININE  1.06 06/18/2014 0515   CALCIUM 9.5 03/11/2015 0850   CALCIUM 9.1 06/18/2014 0515   GFRNONAA 49* 03/11/2015 0850   GFRNONAA 54* 06/18/2014 0515   GFRNONAA 48* 10/18/2013 0203   GFRAA 57* 03/11/2015 0850   GFRAA >60 06/18/2014 0515   GFRAA 56* 10/18/2013 0203   CrCl cannot be calculated (Patient has no serum creatinine result on file.).  COAG Lab Results  Component Value Date   INR 1.18 02/06/2015   INR 1.7 06/19/2014   INR 1.5 06/18/2014    Radiology US Venous Img Lower Bilateral  04/26/2015  CLINICAL DATA:  Bilateral lower extremity edema for several months. History of leukemia. Evaluate for DVT EXAM: BILATERAL LOWER EXTREMITY VENOUS DOPPLER ULTRASOUND TECHNIQUE: Gray-scale sonography with  graded compression, as well as color Doppler and duplex ultrasound were performed to evaluate the lower extremity deep venous systems from the level of the common femoral vein and including the common femoral, femoral, profunda femoral, popliteal and calf veins including the posterior tibial, peroneal and gastrocnemius veins when visible. The superficial great saphenous vein was also interrogated. Spectral Doppler was utilized to evaluate flow at rest and with distal augmentation maneuvers in the common femoral, femoral and popliteal veins. COMPARISON:  None. FINDINGS: RIGHT LOWER EXTREMITY Common Femoral Vein: No evidence of thrombus. Normal compressibility, respiratory phasicity and response to augmentation. Saphenofemoral Junction: No evidence of thrombus. Normal compressibility and flow on color Doppler imaging. Profunda Femoral Vein: No evidence of thrombus. Normal compressibility and flow on color Doppler imaging. Femoral Vein: No evidence of thrombus. Normal compressibility, respiratory phasicity and response to augmentation. Popliteal Vein: No evidence of thrombus. Normal compressibility, respiratory phasicity and response to augmentation. Calf Veins: No evidence of thrombus. Normal compressibility and flow on color Doppler imaging. Superficial Great Saphenous Vein: No evidence of thrombus. Normal compressibility and flow on color Doppler imaging. Venous Reflux:  None. Other Findings: A moderate amount of eccentric mixed echogenic plaque is incidentally noted within the right common femoral artery (representative images 2 and 3). Subcutaneous edema is noted at the level of the right lower leg and calf. LEFT LOWER EXTREMITY Common Femoral Vein: No evidence of thrombus. Normal compressibility, respiratory phasicity and response to augmentation. Saphenofemoral Junction: No evidence of thrombus. Normal compressibility and flow on color Doppler imaging. Profunda Femoral Vein: No evidence of thrombus. Normal  compressibility and flow on color Doppler imaging. Femoral Vein: No evidence of thrombus. Normal compressibility, respiratory phasicity and response to augmentation. Popliteal Vein: No evidence of thrombus. Normal compressibility, respiratory phasicity and response to augmentation. Calf Veins: No evidence of thrombus. Normal compressibility and flow on color Doppler imaging. Superficial Great Saphenous Vein: No evidence of thrombus. Normal compressibility and flow on color Doppler imaging. Venous Reflux:  None. Other Findings: A minimal amount of eccentric mixed echogenic plaque is incidentally noted within the proximal and mid aspects of the left superficial femoral artery (representative images 34 - 36). IMPRESSION: 1. No evidence of DVT within either lower extremity. 2. Minimal to moderate bilateral atherosclerotic plaque is noted within the right common femoral artery as well as within the proximal and mid aspects of the left superficial femoral artery, incompletely evaluated. Correlation for symptoms of PAD is recommended. Further evaluation with ABIs could be performed as clinically indicated. Electronically Signed   By: Sandi Mariscal M.D.   On: 04/26/2015 10:17    Assessment/Plan 1.  Leukemia: Patient has had 2 infected ports but requires further parenteral treatments and chemotherapy she is therefore undergoing placement of a PICC  Verdis Frederickson, Dolores Lory, MD  05/10/2015 4:42 PM

## 2015-05-11 NOTE — Progress Notes (Signed)
[place]  [phone]) [phone] [phone]) [phone]  ID: [name] OB: 05/17/1936  MR#: [id number]  FYB#:[id number]  Patient Care Team: Adin Hector, MD as PCP - General (Internal Medicine)  CHIEF COMPLAINT:  Chief Complaint  Patient presents with  . OTHER    AML-follow up    INTERVAL HISTORY: Patient returns to clinic today for further evaluation and consideration of blood transfusion. Her performance status is stable.  She continues to have weakness and fatigue. She denies any fevers. She has no neurologic complaints. She has a fair appetite. She denies any chest pain or shortness of breath. She has no nausea, vomiting, constipation, or diarrhea. Patient offers no further specific complaints.  REVIEW OF SYSTEMS:   Review of Systems  Constitutional: Positive for malaise/fatigue. Negative for fever.  Respiratory: Negative.   Cardiovascular: Negative.   Neurological: Positive for weakness.  Endo/Heme/Allergies: Does not bruise/bleed easily.    As per HPI. Otherwise, a complete review of systems is negatve.  PAST MEDICAL HISTORY: Past Medical History  Diagnosis Date  . Atrial fibrillation   . Parkinson's disease   . Glaucoma   . Hypertension   . Hypothyroidism   . GERD (gastroesophageal reflux disease)   . High cholesterol   . Diabetes mellitus without complication   . CAD (coronary artery disease) of bypass graft   . History of appendectomy   . H/O: hysterectomy   . Leukemia     PAST SURGICAL HISTORY: Past Surgical History  Procedure Laterality Date  . Cardiac valve replacement    . Pacemaker insertion      FAMILY HISTORY Family History  Problem Relation Age of Onset  . CAD Other   . Heart disease Father        ADVANCED DIRECTIVES:    HEALTH MAINTENANCE: Social History  Substance Use Topics  . Smoking status: Former Smoker -- 1.00 packs/day for 10 years    Types: Cigarettes  . Smokeless tobacco: Not on file  .  Alcohol Use: No     Colonoscopy:  PAP:  Bone density:  Lipid panel:  Allergies  Allergen Reactions  . Hydrochlorothiazide Other (See Comments)    Reaction:  Worsening hypercalcaemia  . Lisinopril Other (See Comments) and Cough    Reaction:  Worsening renal insufficiency at 68m dose.       No current facility-administered medications for this visit.   No current outpatient prescriptions on file.   Facility-Administered Medications Ordered in Other Visits  Medication Dose Route Frequency Provider Last Rate Last Dose  . 0.9 %  sodium chloride infusion   Intravenous Continuous TLloyd Huger MD 999 mL/hr at 10/29/14 1550      OBJECTIVE: Filed Vitals:   05/06/15 0949  BP: 165/69  Pulse: 60  Temp: 96.9 F (36.1 C)     Body mass index is 20.08 kg/(m^2).    ECOG FS:2 - Symptomatic, <50% confined to bed  General: Well-developed, well-nourished, no acute distress. Eyes: anicteric sclera. Lungs: Clear to auscultation bilaterally. Heart: Regular rate and rhythm. No rubs, murmurs, or gallops. Abdomen: Soft, nontender, nondistended. No organomegaly noted, normoactive bowel sounds. Musculoskeletal: No edema, cyanosis, or clubbing. Neuro: Alert, answering all questions appropriately. Cranial nerves grossly intact. Skin: No rashes or petechiae noted. Psych: Normal affect.   LAB RESULTS:  Lab Results  Component Value Date   NA 134* 03/11/2015   K 4.5 03/11/2015   CL 106 03/11/2015   CO2 24 03/11/2015   GLUCOSE 68 03/11/2015  BUN 18 03/11/2015   CREATININE 1.06* 03/11/2015   CALCIUM 9.5 03/11/2015   PROT 7.3 03/11/2015   ALBUMIN 4.0 03/11/2015   AST 11* 03/11/2015   ALT <5* 03/11/2015   ALKPHOS 79 03/11/2015   BILITOT 0.7 03/11/2015   GFRNONAA 49* 03/11/2015   GFRAA 57* 03/11/2015    Lab Results  Component Value Date   WBC 0.7* 05/06/2015   NEUTROABS 0.3* 05/06/2015   HGB 6.9* 05/06/2015   HCT 21.2* 05/06/2015   MCV 95.0 05/06/2015   PLT 109* 05/06/2015       STUDIES: US Venous Img Lower Bilateral  04/26/2015  CLINICAL DATA:  Bilateral lower extremity edema for several months. History of leukemia. Evaluate for DVT EXAM: BILATERAL LOWER EXTREMITY VENOUS DOPPLER ULTRASOUND TECHNIQUE: Gray-scale sonography with graded compression, as well as color Doppler and duplex ultrasound were performed to evaluate the lower extremity deep venous systems from the level of the common femoral vein and including the common femoral, femoral, profunda femoral, popliteal and calf veins including the posterior tibial, peroneal and gastrocnemius veins when visible. The superficial great saphenous vein was also interrogated. Spectral Doppler was utilized to evaluate flow at rest and with distal augmentation maneuvers in the common femoral, femoral and popliteal veins. COMPARISON:  None. FINDINGS: RIGHT LOWER EXTREMITY Common Femoral Vein: No evidence of thrombus. Normal compressibility, respiratory phasicity and response to augmentation. Saphenofemoral Junction: No evidence of thrombus. Normal compressibility and flow on color Doppler imaging. Profunda Femoral Vein: No evidence of thrombus. Normal compressibility and flow on color Doppler imaging. Femoral Vein: No evidence of thrombus. Normal compressibility, respiratory phasicity and response to augmentation. Popliteal Vein: No evidence of thrombus. Normal compressibility, respiratory phasicity and response to augmentation. Calf Veins: No evidence of thrombus. Normal compressibility and flow on color Doppler imaging. Superficial Great Saphenous Vein: No evidence of thrombus. Normal compressibility and flow on color Doppler imaging. Venous Reflux:  None. Other Findings: A moderate amount of eccentric mixed echogenic plaque is incidentally noted within the right common femoral artery (representative images 2 and 3). Subcutaneous edema is noted at the level of the right lower leg and calf. LEFT LOWER EXTREMITY Common Femoral Vein: No  evidence of thrombus. Normal compressibility, respiratory phasicity and response to augmentation. Saphenofemoral Junction: No evidence of thrombus. Normal compressibility and flow on color Doppler imaging. Profunda Femoral Vein: No evidence of thrombus. Normal compressibility and flow on color Doppler imaging. Femoral Vein: No evidence of thrombus. Normal compressibility, respiratory phasicity and response to augmentation. Popliteal Vein: No evidence of thrombus. Normal compressibility, respiratory phasicity and response to augmentation. Calf Veins: No evidence of thrombus. Normal compressibility and flow on color Doppler imaging. Superficial Great Saphenous Vein: No evidence of thrombus. Normal compressibility and flow on color Doppler imaging. Venous Reflux:  None. Other Findings: A minimal amount of eccentric mixed echogenic plaque is incidentally noted within the proximal and mid aspects of the left superficial femoral artery (representative images 34 - 36). IMPRESSION: 1. No evidence of DVT within either lower extremity. 2. Minimal to moderate bilateral atherosclerotic plaque is noted within the right common femoral artery as well as within the proximal and mid aspects of the left superficial femoral artery, incompletely evaluated. Correlation for symptoms of PAD is recommended. Further evaluation with ABIs could be performed as clinically indicated. Electronically Signed   By: Sandi Mariscal M.D.   On: 04/26/2015 10:17    ASSESSMENT: AML  PLAN:    1. AML: Patient continues to decline additional treatment at this time. Her  strength good and she is not ready to enroll in hospice either. No intervention or treatment is planned. Patient will return to clinic in 2 weeks for laboratory work and consideration of blood transfusion and then in 4 weeks for further evaluation.  Of note, her most recent bone marrow biopsy was improved, but still contained AML at approximately 19% of her sample. She completed 4 cycles  of azacitidine 75 mg/m IV in June 2016. If she reinitiate treatment, she will receive this treatment on days 1 through 5 as well as on day 8 and 9.  2. Thrombocytopenia: Improving.  Likely secondary to progressive disease. Continue to hold Lovenox. 3. Anemia: Decreased but stable, proceed with one unit pRBCs later this week after PICC placement. 4. Neutropenia: Likely secondary to progressive disease, monitor. 5. Poor vascular access: Patient is not a candidate for port placement given multiple infections previously. She has agreed to PICC insertion. Return to clinic as above for one unit of blood after PICC placement is complete. Home health has also been ordered to assist in PICC line care.   Patient expressed understanding and was in agreement with this plan. She also understands that She can call clinic at any time with any questions, concerns, or complaints.   Lloyd Huger, MD   05/11/2015 5:12 PM

## 2015-05-13 ENCOUNTER — Encounter: Payer: Self-pay | Admitting: Vascular Surgery

## 2015-05-13 NOTE — Telephone Encounter (Signed)
Received call from [facility] and they went out on 05/11/15.

## 2015-05-15 ENCOUNTER — Other Ambulatory Visit: Payer: Self-pay | Admitting: *Deleted

## 2015-05-15 ENCOUNTER — Telehealth: Payer: Self-pay | Admitting: *Deleted

## 2015-05-15 NOTE — Telephone Encounter (Addendum)
Called and asked that an order be faxed to them [phone] PICC flushes and needs order faxed to pharmacy for Heparin and Saline

## 2015-05-15 NOTE — Telephone Encounter (Signed)
Need orders for PICC line flushes adn need rx sent to pharmacy for heparin and saline flushes

## 2015-05-16 ENCOUNTER — Other Ambulatory Visit: Payer: Self-pay | Admitting: *Deleted

## 2015-05-16 NOTE — Telephone Encounter (Signed)
Orders faxed

## 2015-05-20 ENCOUNTER — Inpatient Hospital Stay: Payer: Medicare Other | Attending: Oncology

## 2015-05-20 ENCOUNTER — Inpatient Hospital Stay: Payer: Medicare Other

## 2015-05-20 VITALS — BP 180/86 | HR 64 | Temp 98.0°F | Resp 20

## 2015-05-20 DIAGNOSIS — K219 Gastro-esophageal reflux disease without esophagitis: Secondary | ICD-10-CM | POA: Insufficient documentation

## 2015-05-20 DIAGNOSIS — R5381 Other malaise: Secondary | ICD-10-CM | POA: Diagnosis not present

## 2015-05-20 DIAGNOSIS — E039 Hypothyroidism, unspecified: Secondary | ICD-10-CM | POA: Insufficient documentation

## 2015-05-20 DIAGNOSIS — I4891 Unspecified atrial fibrillation: Secondary | ICD-10-CM | POA: Insufficient documentation

## 2015-05-20 DIAGNOSIS — E119 Type 2 diabetes mellitus without complications: Secondary | ICD-10-CM | POA: Insufficient documentation

## 2015-05-20 DIAGNOSIS — R5383 Other fatigue: Secondary | ICD-10-CM | POA: Diagnosis not present

## 2015-05-20 DIAGNOSIS — C9200 Acute myeloblastic leukemia, not having achieved remission: Secondary | ICD-10-CM | POA: Diagnosis not present

## 2015-05-20 DIAGNOSIS — D649 Anemia, unspecified: Secondary | ICD-10-CM | POA: Insufficient documentation

## 2015-05-20 DIAGNOSIS — Z87891 Personal history of nicotine dependence: Secondary | ICD-10-CM | POA: Diagnosis not present

## 2015-05-20 DIAGNOSIS — G20 Parkinson's disease: Secondary | ICD-10-CM | POA: Diagnosis not present

## 2015-05-20 DIAGNOSIS — Z79899 Other long term (current) drug therapy: Secondary | ICD-10-CM | POA: Diagnosis not present

## 2015-05-20 DIAGNOSIS — I2510 Atherosclerotic heart disease of native coronary artery without angina pectoris: Secondary | ICD-10-CM | POA: Insufficient documentation

## 2015-05-20 DIAGNOSIS — R600 Localized edema: Secondary | ICD-10-CM | POA: Diagnosis not present

## 2015-05-20 DIAGNOSIS — I10 Essential (primary) hypertension: Secondary | ICD-10-CM | POA: Diagnosis not present

## 2015-05-20 DIAGNOSIS — D696 Thrombocytopenia, unspecified: Secondary | ICD-10-CM | POA: Diagnosis not present

## 2015-05-20 DIAGNOSIS — D709 Neutropenia, unspecified: Secondary | ICD-10-CM | POA: Diagnosis not present

## 2015-05-20 DIAGNOSIS — I25810 Atherosclerosis of coronary artery bypass graft(s) without angina pectoris: Secondary | ICD-10-CM | POA: Insufficient documentation

## 2015-05-20 DIAGNOSIS — R531 Weakness: Secondary | ICD-10-CM | POA: Diagnosis not present

## 2015-05-20 DIAGNOSIS — E7800 Pure hypercholesterolemia, unspecified: Secondary | ICD-10-CM | POA: Diagnosis not present

## 2015-05-20 LAB — CBC WITH DIFFERENTIAL/PLATELET
Basophils Absolute: 0 10*3/uL (ref 0–0.1)
Basophils Relative: 1 %
Eosinophils Absolute: 0 10*3/uL (ref 0–0.7)
HEMATOCRIT: 20.6 % — AB (ref 35.0–47.0)
Hemoglobin: 6.7 g/dL — ABNORMAL LOW (ref 12.0–16.0)
LYMPHS ABS: 0.3 10*3/uL — AB (ref 1.0–3.6)
MCH: 30.9 pg (ref 26.0–34.0)
MCHC: 32.4 g/dL (ref 32.0–36.0)
MCV: 95.4 fL (ref 80.0–100.0)
MONO ABS: 0 10*3/uL — AB (ref 0.2–0.9)
Monocytes Relative: 2 %
NEUTROS ABS: 0.2 10*3/uL — AB (ref 1.4–6.5)
Neutrophils Relative %: 34 %
PLATELETS: 97 10*3/uL — AB (ref 150–440)
RBC: 2.16 MIL/uL — AB (ref 3.80–5.20)
RDW: 24.1 % — ABNORMAL HIGH (ref 11.5–14.5)
WBC: 0.6 10*3/uL — AB (ref 3.6–11.0)

## 2015-05-20 LAB — SAMPLE TO BLOOD BANK

## 2015-05-20 LAB — PREPARE RBC (CROSSMATCH)

## 2015-05-20 MED ORDER — HEPARIN SOD (PORK) LOCK FLUSH 10 UNIT/ML IV SOLN: 10.0000 [IU] | Freq: Once | INTRAVENOUS | Status: AC

## 2015-05-20 MED ORDER — DIPHENHYDRAMINE HCL 50 MG/ML IJ SOLN
25.0000 mg | Freq: Once | INTRAMUSCULAR | Status: AC
  Administered 2015-05-20: 25 mg via INTRAVENOUS
  Filled 2015-05-20: qty 1

## 2015-05-20 MED ORDER — ACETAMINOPHEN 325 MG PO TABS
650.0000 mg | ORAL_TABLET | Freq: Once | ORAL | Status: AC
  Administered 2015-05-20: 650 mg via ORAL
  Filled 2015-05-20: qty 2

## 2015-05-20 MED ORDER — SODIUM CHLORIDE 0.9 % IV SOLN
250.0000 mL | Freq: Once | INTRAVENOUS | Status: AC
  Administered 2015-05-20: 250 mL via INTRAVENOUS
  Filled 2015-05-20: qty 250

## 2015-05-21 LAB — TYPE AND SCREEN
ABO/RH(D): O POS
Antibody Screen: NEGATIVE
Unit division: 0
Unit division: 0

## 2015-05-27 ENCOUNTER — Telehealth: Payer: Self-pay | Admitting: *Deleted

## 2015-05-27 NOTE — Telephone Encounter (Signed)
Called and spoke with Mrs Llamas and she repeated back to me to flush M-W-F

## 2015-05-27 NOTE — Telephone Encounter (Signed)
Asking how often he is to flush her PICC line, has been told M-W-F and daily, please advise

## 2015-06-04 ENCOUNTER — Inpatient Hospital Stay (HOSPITAL_BASED_OUTPATIENT_CLINIC_OR_DEPARTMENT_OTHER): Payer: Medicare Other | Admitting: Oncology

## 2015-06-04 ENCOUNTER — Inpatient Hospital Stay: Payer: Medicare Other

## 2015-06-04 VITALS — BP 186/76 | HR 60 | Temp 98.3°F | Resp 16

## 2015-06-04 DIAGNOSIS — R5381 Other malaise: Secondary | ICD-10-CM

## 2015-06-04 DIAGNOSIS — I10 Essential (primary) hypertension: Secondary | ICD-10-CM

## 2015-06-04 DIAGNOSIS — E119 Type 2 diabetes mellitus without complications: Secondary | ICD-10-CM

## 2015-06-04 DIAGNOSIS — D696 Thrombocytopenia, unspecified: Secondary | ICD-10-CM

## 2015-06-04 DIAGNOSIS — C9200 Acute myeloblastic leukemia, not having achieved remission: Secondary | ICD-10-CM

## 2015-06-04 DIAGNOSIS — I2510 Atherosclerotic heart disease of native coronary artery without angina pectoris: Secondary | ICD-10-CM

## 2015-06-04 DIAGNOSIS — R531 Weakness: Secondary | ICD-10-CM

## 2015-06-04 DIAGNOSIS — D709 Neutropenia, unspecified: Secondary | ICD-10-CM | POA: Diagnosis not present

## 2015-06-04 DIAGNOSIS — G620 Drug-induced polyneuropathy: Secondary | ICD-10-CM

## 2015-06-04 DIAGNOSIS — D649 Anemia, unspecified: Secondary | ICD-10-CM

## 2015-06-04 DIAGNOSIS — R5383 Other fatigue: Secondary | ICD-10-CM

## 2015-06-04 DIAGNOSIS — R600 Localized edema: Secondary | ICD-10-CM

## 2015-06-04 LAB — CBC WITH DIFFERENTIAL/PLATELET
Basophils Absolute: 0 10*3/uL (ref 0–0.1)
EOS ABS: 0 10*3/uL (ref 0–0.7)
Eosinophils Relative: 5 %
HCT: 23 % — ABNORMAL LOW (ref 35.0–47.0)
HEMOGLOBIN: 7.5 g/dL — AB (ref 12.0–16.0)
LYMPHS ABS: 0.3 10*3/uL — AB (ref 1.0–3.6)
Lymphocytes Relative: 55 %
MCH: 31.1 pg (ref 26.0–34.0)
MCHC: 32.4 g/dL (ref 32.0–36.0)
MCV: 96.1 fL (ref 80.0–100.0)
MONO ABS: 0 10*3/uL — AB (ref 0.2–0.9)
Neutro Abs: 0.2 10*3/uL — ABNORMAL LOW (ref 1.4–6.5)
Platelets: 61 10*3/uL — ABNORMAL LOW (ref 150–440)
RBC: 2.39 MIL/uL — ABNORMAL LOW (ref 3.80–5.20)
RDW: 22.6 % — AB (ref 11.5–14.5)
WBC: 0.5 10*3/uL — CL (ref 3.6–11.0)

## 2015-06-04 LAB — PREPARE RBC (CROSSMATCH)

## 2015-06-04 LAB — SAMPLE TO BLOOD BANK

## 2015-06-04 MED ORDER — HEPARIN SOD (PORK) LOCK FLUSH 100 UNIT/ML IV SOLN
250.0000 [IU] | INTRAVENOUS | Status: AC | PRN
  Administered 2015-06-04: 250 [IU]
  Filled 2015-06-04: qty 5

## 2015-06-04 MED ORDER — DIPHENHYDRAMINE HCL 50 MG/ML IJ SOLN
25.0000 mg | Freq: Once | INTRAMUSCULAR | Status: AC
  Administered 2015-06-04: 25 mg via INTRAVENOUS
  Filled 2015-06-04: qty 1

## 2015-06-04 MED ORDER — SODIUM CHLORIDE 0.9 % IJ SOLN
10.0000 mL | INTRAMUSCULAR | Status: AC | PRN
  Filled 2015-06-04: qty 10

## 2015-06-04 MED ORDER — FUROSEMIDE 10 MG/ML IJ SOLN
20.0000 mg | Freq: Once | INTRAMUSCULAR | Status: AC
  Administered 2015-06-04: 20 mg via INTRAVENOUS
  Filled 2015-06-04: qty 2

## 2015-06-04 MED ORDER — SODIUM CHLORIDE 0.9 % IV SOLN
250.0000 mL | Freq: Once | INTRAVENOUS | Status: AC
  Administered 2015-06-04: 250 mL via INTRAVENOUS
  Filled 2015-06-04: qty 250

## 2015-06-04 MED ORDER — ACETAMINOPHEN 325 MG PO TABS
650.0000 mg | ORAL_TABLET | Freq: Once | ORAL | Status: AC
  Administered 2015-06-04: 650 mg via ORAL
  Filled 2015-06-04: qty 2

## 2015-06-04 MED ORDER — SODIUM CHLORIDE 0.9 % IJ SOLN
10.0000 mL | INTRAMUSCULAR | Status: AC | PRN
  Administered 2015-06-04 (×2): 10 mL
  Filled 2015-06-04: qty 10

## 2015-06-04 NOTE — Progress Notes (Signed)
Patient has lower extremity leg edema that is followed by cardiology.  Her fatigue is stable.

## 2015-06-04 NOTE — Progress Notes (Unsigned)
Patients BP is elevated.  Patient stated that she took her losartan this morning.  BLE edematous, will give 20 ml IV lasix prior to transfusion

## 2015-06-05 LAB — TYPE AND SCREEN
ABO/RH(D): O POS
Antibody Screen: NEGATIVE
Unit division: 0

## 2015-06-10 NOTE — Progress Notes (Signed)
[name]  [phone]) [phone] [phone]) [phone]  ID: SHRON OZER OB: [birth year]  MR#: [id number]  EPP#:[id number]  Patient Care Team: Adin Hector, MD as PCP - General (Internal Medicine)  CHIEF COMPLAINT:  Chief Complaint  Patient presents with  . AML    INTERVAL HISTORY: Patient returns to clinic today for further evaluation and consideration of blood transfusion. She has increased peripheral edema that is being monitored by cardiology. Her performance status is stable.  She continues to have weakness and fatigue. She denies any fevers. She has no neurologic complaints. She has a fair appetite. She denies any chest pain or shortness of breath. She has no nausea, vomiting, constipation, or diarrhea. Patient offers no further specific complaints.  REVIEW OF SYSTEMS:   Review of Systems  Constitutional: Positive for malaise/fatigue. Negative for fever.  Respiratory: Negative.   Cardiovascular: Positive for leg swelling.  Musculoskeletal: Negative.   Neurological: Negative.   Endo/Heme/Allergies: Does not bruise/bleed easily.    As per HPI. Otherwise, a complete review of systems is negatve.  PAST MEDICAL HISTORY: Past Medical History  Diagnosis Date  . Atrial fibrillation ([place])   . Parkinson's disease ([place])   . Glaucoma   . Hypertension   . Hypothyroidism   . GERD (gastroesophageal reflux disease)   . High cholesterol   . Diabetes mellitus without complication ([place])   . CAD (coronary artery disease) of bypass graft   . History of appendectomy   . H/O: hysterectomy   . Leukemia ([place])     PAST SURGICAL HISTORY: Past Surgical History  Procedure Laterality Date  . Cardiac valve replacement    . Pacemaker insertion    . Peripheral vascular catheterization N/A 05/10/2015    Procedure: PICC Line Insertion;  Surgeon: Katha Cabal, MD;  Location: [facility];  Service: Cardiovascular;  Laterality: N/A;    FAMILY  HISTORY Family History  Problem Relation Age of Onset  . CAD Other   . Heart disease Father        ADVANCED DIRECTIVES:    HEALTH MAINTENANCE: Social History  Substance Use Topics  . Smoking status: Former Smoker -- 1.00 packs/day for 10 years    Types: Cigarettes  . Smokeless tobacco: Not on file  . Alcohol Use: No     Colonoscopy:  PAP:  Bone density:  Lipid panel:  Allergies  Allergen Reactions  . Hydrochlorothiazide Other (See Comments)    Reaction:  Worsening hypercalcaemia  . Lisinopril Other (See Comments) and Cough    Reaction:  Worsening renal insufficiency at 33m dose.       Current Outpatient Prescriptions  Medication Sig Dispense Refill  . acetaminophen (TYLENOL) 325 MG tablet Take 650 mg by mouth every 4 (four) hours as needed for mild pain or headache.    . brimonidine (ALPHAGAN) 0.15 % ophthalmic solution Place 1 drop into both eyes 2 (two) times daily.    . carbidopa-levodopa (SINEMET IR) 25-100 MG per tablet Take 1 tablet by mouth 4 (four) times daily. 120 tablet 11  . cholecalciferol (VITAMIN D) 1000 UNITS tablet Take 1,000 Units by mouth daily.    . ferrous sulfate 325 (65 FE) MG tablet Take 325 mg by mouth daily.     . furosemide (LASIX) 40 MG tablet Take 20 mg by mouth daily.    .Marland KitchenglipiZIDE (GLUCOTROL XL) 5 MG 24 hr tablet Take 5 mg by mouth daily with breakfast.    . heparin flush 10 UNIT/ML SOLN  injection FLUSH WITH 5ML DAILY TO MAINTAIN POTENCY OF PICC LINE  1  . latanoprost (XALATAN) 0.005 % ophthalmic solution Place 1 drop into both eyes at bedtime.    Marland Kitchen levothyroxine (SYNTHROID, LEVOTHROID) 112 MCG tablet Take 1 tablet (112 mcg total) by mouth daily before breakfast. 30 tablet 11  . losartan (COZAAR) 50 MG tablet Take 1 tablet (50 mg total) by mouth daily. 30 tablet 11  . omeprazole (PRILOSEC) 40 MG capsule Take 40 mg by mouth 2 (two) times daily.    . posaconazole (NOXAFIL) 100 MG TBEC delayed-release tablet Take 300 mg by mouth daily.      . potassium chloride SA (K-DUR,KLOR-CON) 20 MEQ tablet Take 1 tablet (20 mEq total) by mouth 2 (two) times daily. 180 tablet 0  . prochlorperazine (COMPAZINE) 10 MG tablet Take 1 tablet (10 mg total) by mouth every 6 (six) hours as needed (Nausea or vomiting). (Patient taking differently: Take 10 mg by mouth every 6 (six) hours as needed for nausea or vomiting. ) 30 tablet 1  . simvastatin (ZOCOR) 20 MG tablet TAKE 1 TABLET AT BEDTIME    . sotalol (BETAPACE) 80 MG tablet Take 40 mg by mouth.    . valACYclovir (VALTREX) 500 MG tablet Take 500 mg by mouth daily.    . vitamin B-12 (CYANOCOBALAMIN) 250 MCG tablet Take 250 mcg by mouth daily.     No current facility-administered medications for this visit.   Facility-Administered Medications Ordered in Other Visits  Medication Dose Route Frequency Provider Last Rate Last Dose  . 0.9 %  sodium chloride infusion   Intravenous Continuous Lloyd Huger, MD 999 mL/hr at 10/29/14 1550    . heparin flush 10 UNIT/ML injection 10 Units  10 Units Intravenous Once Lloyd Huger, MD      . sodium chloride 0.9 % injection 10 mL  10 mL Intracatheter PRN Lloyd Huger, MD   10 mL at 06/04/15 1609  . sodium chloride 0.9 % injection 10 mL  10 mL Intracatheter PRN Lloyd Huger, MD        OBJECTIVE: Filed Vitals:   06/04/15 0955  BP: 186/76  Pulse: 60  Temp: 98.3 F (36.8 C)  Resp: 16     There is no weight on file to calculate BMI.    ECOG FS:2 - Symptomatic, <50% confined to bed  General: Well-developed, well-nourished, no acute distress. Eyes: anicteric sclera. Lungs: Clear to auscultation bilaterally. Heart: Regular rate and rhythm. No rubs, murmurs, or gallops. Abdomen: Soft, nontender, nondistended. No organomegaly noted, normoactive bowel sounds. Musculoskeletal: 1-2+ bilateral lower extremity edema. Neuro: Alert, answering all questions appropriately. Cranial nerves grossly intact. Skin: No rashes or petechiae noted. Psych:  Normal affect.   LAB RESULTS:  Lab Results  Component Value Date   NA 134* 03/11/2015   K 4.5 03/11/2015   CL 106 03/11/2015   CO2 24 03/11/2015   GLUCOSE 68 03/11/2015   BUN 18 03/11/2015   CREATININE 1.06* 03/11/2015   CALCIUM 9.5 03/11/2015   PROT 7.3 03/11/2015   ALBUMIN 4.0 03/11/2015   AST 11* 03/11/2015   ALT <5* 03/11/2015   ALKPHOS 79 03/11/2015   BILITOT 0.7 03/11/2015   GFRNONAA 49* 03/11/2015   GFRAA 57* 03/11/2015    Lab Results  Component Value Date   WBC 0.5* 06/04/2015   NEUTROABS 0.2* 06/04/2015   HGB 7.5* 06/04/2015   HCT 23.0* 06/04/2015   MCV 96.1 06/04/2015   PLT 61* 06/04/2015  STUDIES: No results found.  ASSESSMENT: AML  PLAN:    1. AML: Patient continues to decline additional treatment at this time. Her strength good and she is not ready to enroll in hospice either. No intervention or treatment is planned. Patient will return to clinic in 2 weeks for laboratory work and consideration of blood transfusion and then in 4 weeks for further evaluation.  Of note, her most recent bone marrow biopsy was improved, but still contained AML at approximately 19% of her sample. She completed 4 cycles of azacitidine 75 mg/m IV in June 2016. If she reinitiate treatment, she will receive this treatment on days 1 through 5 as well as on day 8 and 9.  2. Thrombocytopenia: Stable. Likely secondary to progressive disease. Continue to hold Lovenox. 3. Anemia: Decreased but stable, proceed with one unit pRBCs today. Return to clinic as above. 4. Neutropenia: Likely secondary to progressive disease, monitor. 5. Poor vascular access: PICC line in place with home health as needed.  Patient expressed understanding and was in agreement with this plan. She also understands that She can call clinic at any time with any questions, concerns, or complaints.   Lloyd Huger, MD   06/10/2015 9:09 AM

## 2015-06-18 ENCOUNTER — Inpatient Hospital Stay: Payer: Medicare Other

## 2015-06-18 ENCOUNTER — Inpatient Hospital Stay: Payer: Medicare Other | Attending: Oncology

## 2015-06-18 DIAGNOSIS — C9200 Acute myeloblastic leukemia, not having achieved remission: Secondary | ICD-10-CM | POA: Insufficient documentation

## 2015-06-18 LAB — CBC WITH DIFFERENTIAL/PLATELET
BASOS ABS: 0 10*3/uL (ref 0–0.1)
Basophils Relative: 0 %
Eosinophils Absolute: 0 10*3/uL (ref 0–0.7)
Eosinophils Relative: 4 %
HEMATOCRIT: 20.6 % — AB (ref 35.0–47.0)
HEMOGLOBIN: 6.8 g/dL — AB (ref 12.0–16.0)
LYMPHS ABS: 0.3 10*3/uL — AB (ref 1.0–3.6)
MCH: 31.2 pg (ref 26.0–34.0)
MCHC: 33 g/dL (ref 32.0–36.0)
MCV: 94.8 fL (ref 80.0–100.0)
Monocytes Absolute: 0 10*3/uL — ABNORMAL LOW (ref 0.2–0.9)
Monocytes Relative: 2 %
NEUTROS ABS: 0.1 10*3/uL — AB (ref 1.4–6.5)
Neutrophils Relative %: 25 %
PLATELETS: 52 10*3/uL — AB (ref 150–440)
RBC: 2.17 MIL/uL — AB (ref 3.80–5.20)
RDW: 21.7 % — ABNORMAL HIGH (ref 11.5–14.5)
WBC: 0.4 10*3/uL — AB (ref 3.6–11.0)

## 2015-06-18 LAB — SAMPLE TO BLOOD BANK

## 2015-06-18 LAB — PREPARE RBC (CROSSMATCH)

## 2015-06-18 MED ORDER — HEPARIN SOD (PORK) LOCK FLUSH 100 UNIT/ML IV SOLN
INTRAVENOUS | Status: AC
  Filled 2015-06-18: qty 5

## 2015-06-18 MED ORDER — DIPHENHYDRAMINE HCL 50 MG/ML IJ SOLN
25.0000 mg | Freq: Once | INTRAMUSCULAR | Status: AC
  Administered 2015-06-18: 25 mg via INTRAVENOUS
  Filled 2015-06-18: qty 1

## 2015-06-18 MED ORDER — ACETAMINOPHEN 325 MG PO TABS
650.0000 mg | ORAL_TABLET | Freq: Once | ORAL | Status: AC
  Administered 2015-06-18: 650 mg via ORAL
  Filled 2015-06-18: qty 2

## 2015-06-18 MED ORDER — HEPARIN SOD (PORK) LOCK FLUSH 100 UNIT/ML IV SOLN
300.0000 [IU] | Freq: Once | INTRAVENOUS | Status: AC
  Administered 2015-06-18: 300 [IU] via INTRAVENOUS

## 2015-06-19 LAB — TYPE AND SCREEN
ABO/RH(D): O POS
Antibody Screen: NEGATIVE
UNIT DIVISION: 0

## 2015-07-02 ENCOUNTER — Inpatient Hospital Stay: Payer: Medicare Other

## 2015-07-02 ENCOUNTER — Inpatient Hospital Stay: Payer: Medicare Other | Admitting: Oncology

## 2015-07-02 ENCOUNTER — Inpatient Hospital Stay
Admission: AD | Admit: 2015-07-02 | Discharge: 2015-07-04 | DRG: 835 | Disposition: A | Discharge status: Home / Self Care | Payer: Medicare Other | Source: Ambulatory Visit | Attending: Oncology | Admitting: Oncology

## 2015-07-02 DIAGNOSIS — D61818 Other pancytopenia: Secondary | ICD-10-CM

## 2015-07-02 DIAGNOSIS — E119 Type 2 diabetes mellitus without complications: Secondary | ICD-10-CM | POA: Diagnosis present

## 2015-07-02 DIAGNOSIS — Z9049 Acquired absence of other specified parts of digestive tract: Secondary | ICD-10-CM

## 2015-07-02 DIAGNOSIS — E039 Hypothyroidism, unspecified: Secondary | ICD-10-CM

## 2015-07-02 DIAGNOSIS — F17210 Nicotine dependence, cigarettes, uncomplicated: Secondary | ICD-10-CM

## 2015-07-02 DIAGNOSIS — H409 Unspecified glaucoma: Secondary | ICD-10-CM | POA: Diagnosis present

## 2015-07-02 DIAGNOSIS — R5383 Other fatigue: Secondary | ICD-10-CM | POA: Diagnosis not present

## 2015-07-02 DIAGNOSIS — R5381 Other malaise: Secondary | ICD-10-CM

## 2015-07-02 DIAGNOSIS — D649 Anemia, unspecified: Secondary | ICD-10-CM | POA: Diagnosis present

## 2015-07-02 DIAGNOSIS — I2510 Atherosclerotic heart disease of native coronary artery without angina pectoris: Secondary | ICD-10-CM | POA: Diagnosis present

## 2015-07-02 DIAGNOSIS — Z950 Presence of cardiac pacemaker: Secondary | ICD-10-CM

## 2015-07-02 DIAGNOSIS — C9200 Acute myeloblastic leukemia, not having achieved remission: Principal | ICD-10-CM | POA: Insufficient documentation

## 2015-07-02 DIAGNOSIS — Z90710 Acquired absence of both cervix and uterus: Secondary | ICD-10-CM

## 2015-07-02 DIAGNOSIS — Z952 Presence of prosthetic heart valve: Secondary | ICD-10-CM

## 2015-07-02 DIAGNOSIS — R531 Weakness: Secondary | ICD-10-CM

## 2015-07-02 DIAGNOSIS — Z8249 Family history of ischemic heart disease and other diseases of the circulatory system: Secondary | ICD-10-CM

## 2015-07-02 DIAGNOSIS — G20 Parkinson's disease: Secondary | ICD-10-CM | POA: Diagnosis present

## 2015-07-02 DIAGNOSIS — K219 Gastro-esophageal reflux disease without esophagitis: Secondary | ICD-10-CM | POA: Diagnosis present

## 2015-07-02 DIAGNOSIS — Z87891 Personal history of nicotine dependence: Secondary | ICD-10-CM

## 2015-07-02 DIAGNOSIS — Z79899 Other long term (current) drug therapy: Secondary | ICD-10-CM

## 2015-07-02 DIAGNOSIS — Z888 Allergy status to other drugs, medicaments and biological substances status: Secondary | ICD-10-CM

## 2015-07-02 DIAGNOSIS — D709 Neutropenia, unspecified: Secondary | ICD-10-CM | POA: Diagnosis present

## 2015-07-02 DIAGNOSIS — I10 Essential (primary) hypertension: Secondary | ICD-10-CM | POA: Diagnosis present

## 2015-07-02 DIAGNOSIS — I4891 Unspecified atrial fibrillation: Secondary | ICD-10-CM | POA: Diagnosis present

## 2015-07-02 LAB — CBC WITH DIFFERENTIAL/PLATELET
Basophils Absolute: 0 10*3/uL (ref 0–0.1)
EOS ABS: 0 10*3/uL (ref 0–0.7)
HCT: 14.9 % — CL (ref 35.0–47.0)
HEMOGLOBIN: 4.9 g/dL — AB (ref 12.0–16.0)
Lymphocytes Relative: 82 %
Lymphs Abs: 0.4 10*3/uL — ABNORMAL LOW (ref 1.0–3.6)
MCH: 31.5 pg (ref 26.0–34.0)
MCHC: 32.8 g/dL (ref 32.0–36.0)
MCV: 96 fL (ref 80.0–100.0)
MONO ABS: 0 10*3/uL — AB (ref 0.2–0.9)
Neutro Abs: 0 10*3/uL — ABNORMAL LOW (ref 1.4–6.5)
PLATELETS: 32 10*3/uL — AB (ref 150–440)
RBC: 1.56 MIL/uL — ABNORMAL LOW (ref 3.80–5.20)
RDW: 22.6 % — AB (ref 11.5–14.5)
WBC: 0.4 10*3/uL — CL (ref 3.6–11.0)

## 2015-07-02 LAB — SAMPLE TO BLOOD BANK

## 2015-07-02 LAB — PREPARE RBC (CROSSMATCH)

## 2015-07-02 MED ORDER — BRIMONIDINE TARTRATE 0.15 % OP SOLN
1.0000 [drp] | Freq: Two times a day (BID) | OPHTHALMIC | Status: DC
  Administered 2015-07-02 – 2015-07-04 (×4): 1 [drp] via OPHTHALMIC
  Filled 2015-07-02: qty 5

## 2015-07-02 MED ORDER — ACETAMINOPHEN 325 MG PO TABS: 650.0000 mg | ORAL_TABLET | ORAL | Status: DC | PRN

## 2015-07-02 MED ORDER — SODIUM CHLORIDE 0.9% FLUSH: 10.0000 mL | INTRAVENOUS | Status: DC | PRN

## 2015-07-02 MED ORDER — PANTOPRAZOLE SODIUM 40 MG PO TBEC
40.0000 mg | DELAYED_RELEASE_TABLET | Freq: Every day | ORAL | Status: DC
  Administered 2015-07-03 – 2015-07-04 (×2): 40 mg via ORAL
  Filled 2015-07-02 (×2): qty 1

## 2015-07-02 MED ORDER — HEPARIN SOD (PORK) LOCK FLUSH 100 UNIT/ML IV SOLN: 500.0000 [IU] | Freq: Every day | INTRAVENOUS | Status: DC | PRN

## 2015-07-02 MED ORDER — ONDANSETRON HCL 4 MG PO TABS: 4.0000 mg | ORAL_TABLET | Freq: Three times a day (TID) | ORAL | Status: DC | PRN

## 2015-07-02 MED ORDER — POTASSIUM CHLORIDE CRYS ER 10 MEQ PO TBCR
10.0000 meq | EXTENDED_RELEASE_TABLET | Freq: Two times a day (BID) | ORAL | Status: DC
  Administered 2015-07-02 – 2015-07-04 (×4): 10 meq via ORAL
  Filled 2015-07-02 (×6): qty 1

## 2015-07-02 MED ORDER — SODIUM CHLORIDE 0.9% FLUSH: 3.0000 mL | INTRAVENOUS | Status: DC | PRN

## 2015-07-02 MED ORDER — SODIUM CHLORIDE 0.9% FLUSH
3.0000 mL | Freq: Two times a day (BID) | INTRAVENOUS | Status: DC
  Administered 2015-07-02 – 2015-07-04 (×4): 3 mL via INTRAVENOUS

## 2015-07-02 MED ORDER — HEPARIN SOD (PORK) LOCK FLUSH 100 UNIT/ML IV SOLN: 250.0000 [IU] | INTRAVENOUS | Status: DC | PRN

## 2015-07-02 MED ORDER — CARBIDOPA-LEVODOPA 25-100 MG PO TABS
1.0000 | ORAL_TABLET | Freq: Four times a day (QID) | ORAL | Status: DC
  Administered 2015-07-02 – 2015-07-04 (×7): 1 via ORAL
  Filled 2015-07-02 (×8): qty 1

## 2015-07-02 MED ORDER — SODIUM CHLORIDE 0.9 % IV SOLN
250.0000 mL | Freq: Once | INTRAVENOUS | Status: AC
  Administered 2015-07-02: 250 mL via INTRAVENOUS

## 2015-07-02 MED ORDER — GUAIFENESIN-DM 100-10 MG/5ML PO SYRP: 10.0000 mL | ORAL_SOLUTION | ORAL | Status: DC | PRN

## 2015-07-02 MED ORDER — ONDANSETRON HCL 40 MG/20ML IJ SOLN
8.0000 mg | Freq: Three times a day (TID) | INTRAMUSCULAR | Status: DC | PRN
  Filled 2015-07-02: qty 4

## 2015-07-02 MED ORDER — SOTALOL HCL 80 MG PO TABS
40.0000 mg | ORAL_TABLET | Freq: Every day | ORAL | Status: DC
  Administered 2015-07-03 – 2015-07-04 (×2): 40 mg via ORAL
  Filled 2015-07-02: qty 1
  Filled 2015-07-02: qty 0.5
  Filled 2015-07-02: qty 2
  Filled 2015-07-02: qty 0.5

## 2015-07-02 MED ORDER — HEPARIN SOD (PORK) LOCK FLUSH 100 UNIT/ML IV SOLN
INTRAVENOUS | Status: AC
  Filled 2015-07-02: qty 5

## 2015-07-02 MED ORDER — LATANOPROST 0.005 % OP SOLN
1.0000 [drp] | Freq: Every day | OPHTHALMIC | Status: DC
  Administered 2015-07-02 – 2015-07-03 (×2): 1 [drp] via OPHTHALMIC
  Filled 2015-07-02: qty 2.5

## 2015-07-02 MED ORDER — SODIUM CHLORIDE 0.9 % IV SOLN: 250.0000 mL | INTRAVENOUS | Status: DC | PRN

## 2015-07-02 MED ORDER — HYDROCORTISONE 2.5 % RE CREA
1.0000 "application " | TOPICAL_CREAM | Freq: Two times a day (BID) | RECTAL | Status: DC | PRN
  Filled 2015-07-02: qty 28.35

## 2015-07-02 MED ORDER — SENNOSIDES-DOCUSATE SODIUM 8.6-50 MG PO TABS: 1.0000 | ORAL_TABLET | Freq: Every evening | ORAL | Status: DC | PRN

## 2015-07-02 MED ORDER — ACETAMINOPHEN 325 MG PO TABS
650.0000 mg | ORAL_TABLET | Freq: Once | ORAL | Status: AC
  Administered 2015-07-02: 650 mg via ORAL
  Filled 2015-07-02: qty 2

## 2015-07-02 MED ORDER — ALUM & MAG HYDROXIDE-SIMETH 200-200-20 MG/5ML PO SUSP: 60.0000 mL | ORAL | Status: DC | PRN

## 2015-07-02 MED ORDER — VITAMIN D 1000 UNITS PO TABS
1000.0000 [IU] | ORAL_TABLET | Freq: Every day | ORAL | Status: DC
  Administered 2015-07-03 – 2015-07-04 (×2): 1000 [IU] via ORAL
  Filled 2015-07-02 (×2): qty 1

## 2015-07-02 MED ORDER — LOSARTAN POTASSIUM 50 MG PO TABS
50.0000 mg | ORAL_TABLET | Freq: Every day | ORAL | Status: DC
Start: 2015-07-02 — End: 2015-07-04
  Administered 2015-07-03 – 2015-07-04 (×2): 50 mg via ORAL
  Filled 2015-07-02 (×2): qty 1

## 2015-07-02 MED ORDER — FERROUS SULFATE 325 (65 FE) MG PO TABS
325.0000 mg | ORAL_TABLET | Freq: Every day | ORAL | Status: DC
Start: 2015-07-02 — End: 2015-07-04
  Administered 2015-07-03 – 2015-07-04 (×2): 325 mg via ORAL
  Filled 2015-07-02 (×2): qty 1

## 2015-07-02 MED ORDER — HYDRALAZINE HCL 25 MG PO TABS
50.0000 mg | ORAL_TABLET | Freq: Two times a day (BID) | ORAL | Status: DC
  Administered 2015-07-02 – 2015-07-04 (×4): 50 mg via ORAL
  Filled 2015-07-02 (×4): qty 2

## 2015-07-02 MED ORDER — POSACONAZOLE 100 MG PO TBEC
300.0000 mg | DELAYED_RELEASE_TABLET | Freq: Every day | ORAL | Status: DC
Start: 2015-07-02 — End: 2015-07-04
  Administered 2015-07-03: 10:00:00 300 mg via ORAL
  Filled 2015-07-02 (×4): qty 3

## 2015-07-02 MED ORDER — LEVOTHYROXINE SODIUM 112 MCG PO TABS
112.0000 ug | ORAL_TABLET | Freq: Every day | ORAL | Status: DC
  Administered 2015-07-03 – 2015-07-04 (×2): 112 ug via ORAL
  Filled 2015-07-02 (×2): qty 1

## 2015-07-02 MED ORDER — ONDANSETRON HCL 4 MG/2ML IJ SOLN: 4.0000 mg | Freq: Three times a day (TID) | INTRAMUSCULAR | Status: DC | PRN

## 2015-07-02 MED ORDER — DIPHENHYDRAMINE HCL 50 MG/ML IJ SOLN
25.0000 mg | Freq: Once | INTRAMUSCULAR | Status: AC
  Administered 2015-07-02: 25 mg via INTRAVENOUS
  Filled 2015-07-02: qty 1

## 2015-07-02 MED ORDER — ISOSORBIDE DINITRATE 10 MG PO TABS
30.0000 mg | ORAL_TABLET | Freq: Two times a day (BID) | ORAL | Status: DC
  Administered 2015-07-02 – 2015-07-04 (×4): 30 mg via ORAL
  Filled 2015-07-02 (×4): qty 3

## 2015-07-02 MED ORDER — ONDANSETRON 4 MG PO TBDP
4.0000 mg | ORAL_TABLET | Freq: Three times a day (TID) | ORAL | Status: DC | PRN
  Filled 2015-07-02: qty 2

## 2015-07-02 MED ORDER — GLIPIZIDE ER 5 MG PO TB24
5.0000 mg | ORAL_TABLET | Freq: Every day | ORAL | Status: DC
  Administered 2015-07-03 – 2015-07-04 (×2): 5 mg via ORAL
  Filled 2015-07-02 (×3): qty 1

## 2015-07-02 MED ORDER — FUROSEMIDE 20 MG PO TABS
20.0000 mg | ORAL_TABLET | Freq: Every day | ORAL | Status: DC
  Administered 2015-07-03 – 2015-07-04 (×2): 20 mg via ORAL
  Filled 2015-07-02 (×2): qty 1

## 2015-07-02 NOTE — H&P (Signed)
[place]  [phone] Fax:(336) [phone]  ID: [name] OB: January 02, 1936  MR#: [id number]  [id number]#:[id number]  Patient Care Team: Adin Hector, MD as PCP - General (Internal Medicine)  CHIEF COMPLAINT: Symptomatic anemia, AML  INTERVAL HISTORY: Patient initially evaluated in clinic and found to have a decreased hemoglobin of 4.9. She has mild increased weakness and fatigue, but otherwise feels well.  Her performance status is stable. She denies any fevers. She has no neurologic complaints. She has a fair appetite. She denies any chest pain or shortness of breath. She has no nausea, vomiting, constipation, or diarrhea. She denies any easy bleeding or bruising.  Patient offers no further specific complaints.  REVIEW OF SYSTEMS:   Review of Systems  Constitutional: Positive for malaise/fatigue. Negative for fever and weight loss.  Respiratory: Negative for shortness of breath.   Cardiovascular: Negative.  Negative for chest pain.  Gastrointestinal: Negative.  Negative for blood in stool and melena.  Musculoskeletal: Negative.   Neurological: Positive for weakness.  Endo/Heme/Allergies: Does not bruise/bleed easily.  Psychiatric/Behavioral: Negative.     As per HPI. Otherwise, a complete review of systems is negatve.  PAST MEDICAL HISTORY: Past Medical History  Diagnosis Date  . Atrial fibrillation ([place])   . Parkinson's disease ([place])   . Glaucoma   . Hypertension   . Hypothyroidism   . GERD (gastroesophageal reflux disease)   . High cholesterol   . Diabetes mellitus without complication ([place])   . CAD (coronary artery disease) of bypass graft   . History of appendectomy   . H/O: hysterectomy   . Leukemia ([place])     PAST SURGICAL HISTORY: Past Surgical History  Procedure Laterality Date  . Cardiac valve replacement    . Pacemaker insertion    . Peripheral vascular catheterization N/A 05/10/2015    Procedure: PICC Line Insertion;   Surgeon: Katha Cabal, MD;  Location: [facility];  Service: Cardiovascular;  Laterality: N/A;    FAMILY HISTORY Family History  Problem Relation Age of Onset  . CAD Other   . Heart disease Father        ADVANCED DIRECTIVES:    HEALTH MAINTENANCE: Social History  Substance Use Topics  . Smoking status: Former Smoker -- 1.00 packs/day for 10 years    Types: Cigarettes  . Smokeless tobacco: None  . Alcohol Use: No     Colonoscopy:  PAP:  Bone density:  Lipid panel:  Allergies  Allergen Reactions  . Hydrochlorothiazide Other (See Comments)    Reaction:  Worsening hypercalcaemia  . Lisinopril Other (See Comments) and Cough    Reaction:  Worsening renal insufficiency at 57m dose.       Current Facility-Administered Medications  Medication Dose Route Frequency Provider Last Rate Last Dose  . 0.9 %  sodium chloride infusion  250 mL Intravenous PRN TLloyd Huger MD      . acetaminophen (TYLENOL) tablet 650 mg  650 mg Oral Q4H PRN TLloyd Huger MD      . acetaminophen (TYLENOL) tablet 650 mg  650 mg Oral Q4H PRN TLloyd Huger MD      . alum & mag hydroxide-simeth (MAALOX/MYLANTA) 200-200-20 MG/5ML suspension 60 mL  60 mL Oral Q4H PRN TLloyd Huger MD      . brimonidine (ALPHAGAN) 0.15 % ophthalmic solution 1 drop  1 drop Both Eyes BID TLloyd Huger MD      . carbidopa-levodopa (SINEMET IR) 25-100 MG per  tablet immediate release 1 tablet  1 tablet Oral QID Lloyd Huger, MD      . cholecalciferol (VITAMIN D) tablet 1,000 Units  1,000 Units Oral Daily Lloyd Huger, MD      . ferrous sulfate tablet 325 mg  325 mg Oral Daily Lloyd Huger, MD      . furosemide (LASIX) tablet 20 mg  20 mg Oral Daily Lloyd Huger, MD      . Derrill Memo ON 07/03/2015] glipiZIDE (GLUCOTROL XL) 24 hr tablet 5 mg  5 mg Oral Q breakfast Lloyd Huger, MD      . guaiFENesin-dextromethorphan (ROBITUSSIN DM) 100-10 MG/5ML syrup 10 mL  10 mL  Oral Q4H PRN Lloyd Huger, MD      . heparin lock flush 100 unit/mL  500 Units Intracatheter Daily PRN Lloyd Huger, MD      . heparin lock flush 100 unit/mL  250 Units Intracatheter PRN Lloyd Huger, MD      . hydrALAZINE (APRESOLINE) tablet 50 mg  50 mg Oral BID Lloyd Huger, MD      . hydrocortisone (ANUSOL-HC) 2.5 % rectal cream 1 application  1 application Rectal BID PRN Lloyd Huger, MD      . isosorbide dinitrate (ISORDIL) tablet 30 mg  30 mg Oral BID Lloyd Huger, MD      . latanoprost (XALATAN) 0.005 % ophthalmic solution 1 drop  1 drop Both Eyes QHS Lloyd Huger, MD      . Derrill Memo ON 07/03/2015] levothyroxine (SYNTHROID, LEVOTHROID) tablet 112 mcg  112 mcg Oral QAC breakfast Lloyd Huger, MD      . losartan (COZAAR) tablet 50 mg  50 mg Oral Daily Lloyd Huger, MD      . ondansetron [facility]) tablet 4-8 mg  4-8 mg Oral Q8H PRN Lloyd Huger, MD       Or  . ondansetron (ZOFRAN-ODT) disintegrating tablet 4-8 mg  4-8 mg Oral Q8H PRN Lloyd Huger, MD       Or  . ondansetron (ZOFRAN) injection 4 mg  4 mg Intravenous Q8H PRN Lloyd Huger, MD       Or  . ondansetron (ZOFRAN) 8 mg in sodium chloride 0.9 % 50 mL IVPB  8 mg Intravenous Q8H PRN Lloyd Huger, MD      . pantoprazole (PROTONIX) EC tablet 40 mg  40 mg Oral Daily Lloyd Huger, MD      . posaconazole (NOXAFIL) delayed-release tablet 300 mg  300 mg Oral Daily Lloyd Huger, MD      . potassium chloride (K-DUR) CR tablet 10 mEq  10 mEq Oral BID Lloyd Huger, MD      . senna-docusate (Senokot-S) tablet 1 tablet  1 tablet Oral QHS PRN Lloyd Huger, MD      . sodium chloride flush (NS) 0.9 % injection 10 mL  10 mL Intracatheter PRN Lloyd Huger, MD      . sodium chloride flush (NS) 0.9 % injection 3 mL  3 mL Intracatheter PRN Lloyd Huger, MD      . sodium chloride flush (NS) 0.9 % injection 3 mL  3 mL Intravenous Q12H Lloyd Huger, MD   3 mL at 07/02/15 1609  . sodium chloride flush (NS) 0.9 % injection 3 mL  3 mL Intravenous PRN Lloyd Huger, MD      . sotalol (BETAPACE) tablet 40 mg  40 mg Oral Daily Lloyd Huger, MD       Facility-Administered Medications Ordered in Other Encounters  Medication Dose Route Frequency Provider Last Rate Last Dose  . 0.9 %  sodium chloride infusion   Intravenous Continuous Lloyd Huger, MD 999 mL/hr at 10/29/14 1550    . heparin flush 10 UNIT/ML injection 10 Units  10 Units Intravenous Once Lloyd Huger, MD      . sodium chloride 0.9 % injection 10 mL  10 mL Intracatheter PRN Lloyd Huger, MD   10 mL at 06/04/15 1609  . sodium chloride 0.9 % injection 10 mL  10 mL Intracatheter PRN Lloyd Huger, MD        OBJECTIVE: Filed Vitals:   07/02/15 1627 07/02/15 1645  BP: 125/63 116/42  Pulse: 60 59  Temp: 97.4 F (36.3 C) 97.5 F (36.4 C)  Resp: 16 18     Body mass index is 21.45 kg/(m^2).    ECOG FS:0 - Asymptomatic  General: Well-developed, well-nourished, no acute distress. Eyes: Pink conjunctiva, anicteric sclera. HEENT: Normocephalic, moist mucous membranes, clear oropharnyx. Lungs: Clear to auscultation bilaterally. Heart: Regular rate and rhythm. No rubs, murmurs, or gallops. Abdomen: Soft, nontender, nondistended. No organomegaly noted, normoactive bowel sounds. Musculoskeletal: 1-2+ bilateral or lower extremity edema  Neuro: Alert, answering all questions appropriately. Cranial nerves grossly intact. Skin: No rashes or petechiae noted. Psych: Normal affect. Lymphatics: No cervical, calvicular, axillary or inguinal LAD.   LAB RESULTS:  Lab Results  Component Value Date   NA 134* 03/11/2015   K 4.5 03/11/2015   CL 106 03/11/2015   CO2 24 03/11/2015   GLUCOSE 68 03/11/2015   BUN 18 03/11/2015   CREATININE 1.06* 03/11/2015   CALCIUM 9.5 03/11/2015   PROT 7.3 03/11/2015   ALBUMIN 4.0 03/11/2015   AST 11* 03/11/2015    ALT <5* 03/11/2015   ALKPHOS 79 03/11/2015   BILITOT 0.7 03/11/2015   GFRNONAA 49* 03/11/2015   GFRAA 57* 03/11/2015    Lab Results  Component Value Date   WBC 0.4* 07/02/2015   NEUTROABS 0.0* 07/02/2015   HGB 4.9* 07/02/2015   HCT 14.9* 07/02/2015   MCV 96.0 07/02/2015   PLT 32* 07/02/2015     STUDIES: No results found.  ASSESSMENT: Symptomatic anemia, pancytopenia, AML.  PLAN:    1. AML: Given her worsening pancytopenia, there is suspicion of progression of disease. Patient continues to decline additional treatment at this time. Her strength good and she is not ready to enroll in hospice either. No intervention or treatment is planned.  Of note, her most recent bone marrow biopsy was improved, but still contained AML at approximately 19% of her sample. Patient does not wish to have another bone marrow biopsy at this time.  She completed 4 cycles of azacitidine 75 mg/m IV in June 2016. If she reinitiates treatment, she will receive this treatment on days 1 through 5 as well as on day 8 and 9.  2. Thrombocytopenia: Platelet count is declining. Likely secondary to progressive disease. Continue to hold Lovenox. 3. Anemia: Significantly decreased. Proceed with 2 units of packed red blood cells today. Repeat CBC in the morning and consider additional units tomorrow  4. Neutropenia: Likely secondary to progressive disease, monitor. 5. Poor vascular access: PICC line in place with home health as needed. 6. Disposition: Admit to observation, possible discharge in 1-2 days.   Lloyd Huger, MD   07/02/2015 5:49 PM

## 2015-07-02 NOTE — Progress Notes (Signed)
Patient reports not feeling any weaker than usual despite hemoglobin of 4.9.    Being admitted to room 124 and call report given to [facility].

## 2015-07-03 DIAGNOSIS — C9200 Acute myeloblastic leukemia, not having achieved remission: Secondary | ICD-10-CM | POA: Diagnosis present

## 2015-07-03 DIAGNOSIS — Z87891 Personal history of nicotine dependence: Secondary | ICD-10-CM | POA: Diagnosis not present

## 2015-07-03 DIAGNOSIS — Z90710 Acquired absence of both cervix and uterus: Secondary | ICD-10-CM | POA: Diagnosis not present

## 2015-07-03 DIAGNOSIS — E119 Type 2 diabetes mellitus without complications: Secondary | ICD-10-CM | POA: Diagnosis present

## 2015-07-03 DIAGNOSIS — R5383 Other fatigue: Secondary | ICD-10-CM | POA: Diagnosis not present

## 2015-07-03 DIAGNOSIS — E039 Hypothyroidism, unspecified: Secondary | ICD-10-CM | POA: Diagnosis present

## 2015-07-03 DIAGNOSIS — Z952 Presence of prosthetic heart valve: Secondary | ICD-10-CM | POA: Diagnosis not present

## 2015-07-03 DIAGNOSIS — D709 Neutropenia, unspecified: Secondary | ICD-10-CM | POA: Diagnosis present

## 2015-07-03 DIAGNOSIS — Z950 Presence of cardiac pacemaker: Secondary | ICD-10-CM | POA: Diagnosis not present

## 2015-07-03 DIAGNOSIS — I2510 Atherosclerotic heart disease of native coronary artery without angina pectoris: Secondary | ICD-10-CM | POA: Diagnosis present

## 2015-07-03 DIAGNOSIS — R531 Weakness: Secondary | ICD-10-CM | POA: Diagnosis not present

## 2015-07-03 DIAGNOSIS — I10 Essential (primary) hypertension: Secondary | ICD-10-CM | POA: Diagnosis present

## 2015-07-03 DIAGNOSIS — Z888 Allergy status to other drugs, medicaments and biological substances status: Secondary | ICD-10-CM | POA: Diagnosis not present

## 2015-07-03 DIAGNOSIS — Z8249 Family history of ischemic heart disease and other diseases of the circulatory system: Secondary | ICD-10-CM | POA: Diagnosis not present

## 2015-07-03 DIAGNOSIS — D61818 Other pancytopenia: Secondary | ICD-10-CM | POA: Diagnosis present

## 2015-07-03 DIAGNOSIS — G20 Parkinson's disease: Secondary | ICD-10-CM | POA: Diagnosis present

## 2015-07-03 DIAGNOSIS — K219 Gastro-esophageal reflux disease without esophagitis: Secondary | ICD-10-CM | POA: Diagnosis present

## 2015-07-03 DIAGNOSIS — H409 Unspecified glaucoma: Secondary | ICD-10-CM | POA: Diagnosis present

## 2015-07-03 DIAGNOSIS — I4891 Unspecified atrial fibrillation: Secondary | ICD-10-CM | POA: Diagnosis present

## 2015-07-03 DIAGNOSIS — Z9049 Acquired absence of other specified parts of digestive tract: Secondary | ICD-10-CM | POA: Diagnosis not present

## 2015-07-03 LAB — CBC
HEMATOCRIT: 17.6 % — AB (ref 35.0–47.0)
HEMOGLOBIN: 5.9 g/dL — AB (ref 12.0–16.0)
MCH: 30.4 pg (ref 26.0–34.0)
MCHC: 33.5 g/dL (ref 32.0–36.0)
MCV: 90.9 fL (ref 80.0–100.0)
Platelets: 20 10*3/uL — CL (ref 150–440)
RBC: 1.93 MIL/uL — ABNORMAL LOW (ref 3.80–5.20)
RDW: 16.7 % — AB (ref 11.5–14.5)
WBC: 0.4 10*3/uL — CL (ref 3.6–11.0)

## 2015-07-03 LAB — PREPARE RBC (CROSSMATCH)

## 2015-07-03 MED ORDER — SODIUM CHLORIDE 0.9% FLUSH: 10.0000 mL | INTRAVENOUS | Status: DC | PRN

## 2015-07-03 MED ORDER — SODIUM CHLORIDE 0.9% FLUSH: 3.0000 mL | INTRAVENOUS | Status: DC | PRN

## 2015-07-03 MED ORDER — SODIUM CHLORIDE 0.9 % IV SOLN
250.0000 mL | Freq: Once | INTRAVENOUS | Status: AC
  Administered 2015-07-03: 12:00:00 250 mL via INTRAVENOUS

## 2015-07-03 MED ORDER — DIPHENHYDRAMINE HCL 50 MG/ML IJ SOLN
25.0000 mg | Freq: Once | INTRAMUSCULAR | Status: AC
  Administered 2015-07-03: 25 mg via INTRAVENOUS
  Filled 2015-07-03: qty 1

## 2015-07-03 MED ORDER — HEPARIN SOD (PORK) LOCK FLUSH 100 UNIT/ML IV SOLN: 250.0000 [IU] | INTRAVENOUS | Status: DC | PRN

## 2015-07-03 MED ORDER — ACETAMINOPHEN 325 MG PO TABS
650.0000 mg | ORAL_TABLET | Freq: Once | ORAL | Status: AC
  Administered 2015-07-03: 650 mg via ORAL
  Filled 2015-07-03: qty 2

## 2015-07-03 MED ORDER — HEPARIN SOD (PORK) LOCK FLUSH 100 UNIT/ML IV SOLN: 500.0000 [IU] | Freq: Every day | INTRAVENOUS | Status: DC | PRN

## 2015-07-03 NOTE — Progress Notes (Signed)
[place]  Telephone:(336) [phone] Fax:(336) [phone]  ID: PRIYAL MUSQUIZ OB: 06/10/1935  MR#: [id number]  ZDG#:[id number]  Patient Care Team: Adin Hector, MD as PCP - General (Internal Medicine)  CHIEF COMPLAINT: Symptomatic anemia, AML  INTERVAL HISTORY: Patient still with increased weakness and fatigue essentially unchanged. No fevers. No further complaints.  REVIEW OF SYSTEMS:   Review of Systems  Constitutional: Positive for malaise/fatigue. Negative for fever.  Respiratory: Negative.   Cardiovascular: Negative.   Gastrointestinal: Negative.  Negative for blood in stool and melena.  Musculoskeletal: Negative.   Neurological: Positive for weakness.    As per HPI. Otherwise, a complete review of systems is negatve.  PAST MEDICAL HISTORY: Past Medical History  Diagnosis Date  . Atrial fibrillation ([place])   . Parkinson's disease ([place])   . Glaucoma   . Hypertension   . Hypothyroidism   . GERD (gastroesophageal reflux disease)   . High cholesterol   . Diabetes mellitus without complication ([place])   . CAD (coronary artery disease) of bypass graft   . History of appendectomy   . H/O: hysterectomy   . Leukemia ([place])     PAST SURGICAL HISTORY: Past Surgical History  Procedure Laterality Date  . Cardiac valve replacement    . Pacemaker insertion    . Peripheral vascular catheterization N/A 05/10/2015    Procedure: PICC Line Insertion;  Surgeon: Katha Cabal, MD;  Location: [facility];  Service: Cardiovascular;  Laterality: N/A;    FAMILY HISTORY Family History  Problem Relation Age of Onset  . CAD Other   . Heart disease Father        ADVANCED DIRECTIVES:    HEALTH MAINTENANCE: Social History  Substance Use Topics  . Smoking status: Former Smoker -- 1.00 packs/day for 10 years    Types: Cigarettes  . Smokeless tobacco: None  . Alcohol Use: No     Colonoscopy:  PAP:  Bone density:  Lipid panel:  Allergies    Allergen Reactions  . Hydrochlorothiazide Other (See Comments)    Reaction:  Worsening hypercalcaemia  . Lisinopril Other (See Comments) and Cough    Reaction:  Worsening renal insufficiency at 29m dose.       Current Facility-Administered Medications  Medication Dose Route Frequency Provider Last Rate Last Dose  . 0.9 %  sodium chloride infusion  250 mL Intravenous PRN TLloyd Huger MD      . acetaminophen (TYLENOL) tablet 650 mg  650 mg Oral Q4H PRN TLloyd Huger MD      . acetaminophen (TYLENOL) tablet 650 mg  650 mg Oral Q4H PRN TLloyd Huger MD      . alum & mag hydroxide-simeth (MAALOX/MYLANTA) 200-200-20 MG/5ML suspension 60 mL  60 mL Oral Q4H PRN TLloyd Huger MD      . brimonidine (ALPHAGAN) 0.15 % ophthalmic solution 1 drop  1 drop Both Eyes BID TLloyd Huger MD   1 drop at 07/03/15 1010  . carbidopa-levodopa (SINEMET IR) 25-100 MG per tablet immediate release 1 tablet  1 tablet Oral QID TLloyd Huger MD   1 tablet at 07/03/15 1008  . cholecalciferol (VITAMIN D) tablet 1,000 Units  1,000 Units Oral Daily TLloyd Huger MD   1,000 Units at 07/03/15 1008  . ferrous sulfate tablet 325 mg  325 mg Oral Daily TLloyd Huger MD   325 mg at 07/03/15 1008  . furosemide (LASIX) tablet 20 mg  20 mg Oral  Daily Lloyd Huger, MD   20 mg at 07/03/15 1010  . glipiZIDE (GLUCOTROL XL) 24 hr tablet 5 mg  5 mg Oral Q breakfast Lloyd Huger, MD   5 mg at 07/03/15 1008  . guaiFENesin-dextromethorphan (ROBITUSSIN DM) 100-10 MG/5ML syrup 10 mL  10 mL Oral Q4H PRN Lloyd Huger, MD      . heparin lock flush 100 unit/mL  500 Units Intracatheter Daily PRN Lloyd Huger, MD      . heparin lock flush 100 unit/mL  250 Units Intracatheter PRN Lloyd Huger, MD      . heparin lock flush 100 unit/mL  500 Units Intracatheter Daily PRN Lloyd Huger, MD      . heparin lock flush 100 unit/mL  250 Units Intracatheter PRN Lloyd Huger, MD      . hydrALAZINE (APRESOLINE) tablet 50 mg  50 mg Oral BID Lloyd Huger, MD   50 mg at 07/03/15 1008  . hydrocortisone (ANUSOL-HC) 2.5 % rectal cream 1 application  1 application Rectal BID PRN Lloyd Huger, MD      . isosorbide dinitrate (ISORDIL) tablet 30 mg  30 mg Oral BID Lloyd Huger, MD   30 mg at 07/03/15 1009  . latanoprost (XALATAN) 0.005 % ophthalmic solution 1 drop  1 drop Both Eyes QHS Lloyd Huger, MD   1 drop at 07/02/15 2300  . levothyroxine (SYNTHROID, LEVOTHROID) tablet 112 mcg  112 mcg Oral QAC breakfast Lloyd Huger, MD   112 mcg at 07/03/15 1008  . losartan (COZAAR) tablet 50 mg  50 mg Oral Daily Lloyd Huger, MD   50 mg at 07/03/15 1008  . ondansetron (ZOFRAN) tablet 4-8 mg  4-8 mg Oral Q8H PRN Lloyd Huger, MD       Or  . ondansetron (ZOFRAN-ODT) disintegrating tablet 4-8 mg  4-8 mg Oral Q8H PRN Lloyd Huger, MD       Or  . ondansetron [facility]) injection 4 mg  4 mg Intravenous Q8H PRN Lloyd Huger, MD       Or  . ondansetron (ZOFRAN) 8 mg in sodium chloride 0.9 % 50 mL IVPB  8 mg Intravenous Q8H PRN Lloyd Huger, MD      . pantoprazole (PROTONIX) EC tablet 40 mg  40 mg Oral Daily Lloyd Huger, MD   40 mg at 07/03/15 1008  . posaconazole (NOXAFIL) delayed-release tablet 300 mg  300 mg Oral Daily Lloyd Huger, MD   300 mg at 07/03/15 1010  . potassium chloride (K-DUR,KLOR-CON) CR tablet 10 mEq  10 mEq Oral BID Lloyd Huger, MD   10 mEq at 07/03/15 1008  . senna-docusate (Senokot-S) tablet 1 tablet  1 tablet Oral QHS PRN Lloyd Huger, MD      . sodium chloride flush (NS) 0.9 % injection 10 mL  10 mL Intracatheter PRN Lloyd Huger, MD      . sodium chloride flush (NS) 0.9 % injection 10 mL  10 mL Intracatheter PRN Lloyd Huger, MD      . sodium chloride flush (NS) 0.9 % injection 3 mL  3 mL Intracatheter PRN Lloyd Huger, MD      . sodium chloride flush (NS) 0.9  % injection 3 mL  3 mL Intravenous Q12H Lloyd Huger, MD   3 mL at 07/03/15 1011  . sodium chloride flush (NS) 0.9 % injection 3 mL  3  mL Intravenous PRN Lloyd Huger, MD      . sodium chloride flush (NS) 0.9 % injection 3 mL  3 mL Intracatheter PRN Lloyd Huger, MD      . sotalol (BETAPACE) tablet 40 mg  40 mg Oral Daily Lloyd Huger, MD   40 mg at 07/03/15 1009   Facility-Administered Medications Ordered in Other Encounters  Medication Dose Route Frequency Provider Last Rate Last Dose  . 0.9 %  sodium chloride infusion   Intravenous Continuous Lloyd Huger, MD 999 mL/hr at 10/29/14 1550    . heparin flush 10 UNIT/ML injection 10 Units  10 Units Intravenous Once Lloyd Huger, MD      . sodium chloride 0.9 % injection 10 mL  10 mL Intracatheter PRN Lloyd Huger, MD   10 mL at 06/04/15 1609  . sodium chloride 0.9 % injection 10 mL  10 mL Intracatheter PRN Lloyd Huger, MD        OBJECTIVE: Filed Vitals:   07/03/15 1119 07/03/15 1156  BP: 129/48 142/56  Pulse: 60 60  Temp: 97.9 F (36.6 C) 98.8 F (37.1 C)  Resp: 18 18     Body mass index is 21.45 kg/(m^2).    ECOG FS:3 - Symptomatic, >50% confined to bed  General: Well-developed, well-nourished, no acute distress. Eyes: Pink conjunctiva, anicteric sclera. Lungs: Clear to auscultation bilaterally. Heart: Regular rate and rhythm. No rubs, murmurs, or gallops. Abdomen: Soft, nontender, nondistended. No organomegaly noted, normoactive bowel sounds. Musculoskeletal: No edema, cyanosis, or clubbing. Neuro: Alert, answering all questions appropriately. Cranial nerves grossly intact. Skin: No rashes or petechiae noted. Psych: Normal affect.    LAB RESULTS:  Lab Results  Component Value Date   NA 134* 03/11/2015   K 4.5 03/11/2015   CL 106 03/11/2015   CO2 24 03/11/2015   GLUCOSE 68 03/11/2015   BUN 18 03/11/2015   CREATININE 1.06* 03/11/2015   CALCIUM 9.5 03/11/2015   PROT 7.3  03/11/2015   ALBUMIN 4.0 03/11/2015   AST 11* 03/11/2015   ALT <5* 03/11/2015   ALKPHOS 79 03/11/2015   BILITOT 0.7 03/11/2015   GFRNONAA 49* 03/11/2015   GFRAA 57* 03/11/2015    Lab Results  Component Value Date   WBC 0.4* 07/03/2015   NEUTROABS 0.0* 07/02/2015   HGB 5.9* 07/03/2015   HCT 17.6* 07/03/2015   MCV 90.9 07/03/2015   PLT 20* 07/03/2015     STUDIES: No results found.  ASSESSMENT: Symptomatic anemia, pancytopenia, AML.  PLAN:    1. AML: Given her worsening pancytopenia, there is suspicion of progression of disease. Patient continues to decline additional treatment at this time. She is not ready to enroll in hospice either. No intervention or treatment is planned at this time. Of note, her most recent bone marrow biopsy was improved, but still contained AML at approximately 19% of her sample. Patient does not wish to have another bone marrow biopsy at this time. She completed 4 cycles of azacitidine 75 mg/m IV in June 2016. If she reinitiates treatment, she will receive this treatment on days 1 through 5 as well as on day 8 and 9.  2. Thrombocytopenia: Platelet count is declining. Likely secondary to progressive disease. Continue to hold Lovenox. 3. Anemia: Significantly decreased, but improved since receiving 2 units of packed red blood yesterday. We will give 2 additional units today. Repeat CBC in the morning. 4. Neutropenia: Likely secondary to progressive disease, monitor. 5. Poor vascular access: PICC line  in place with home health as needed. 6. Disposition: Possible discharged home in 1-2 days.   Lloyd Huger, MD   07/03/2015 1:00 PM

## 2015-07-03 NOTE — Plan of Care (Signed)
Problem: Education: Goal: Knowledge of [facility] Education information/materials will improve Outcome: Progressing Education provided on s/s of blood reactions.  Patient without reactions or complications from transfusions

## 2015-07-03 NOTE — Care Management Obs Status (Signed)
[facility] NOTIFICATION   Patient Details  Name: [name] MRN: [id number] Date of Birth: May 09, 1936   Medicare Observation Status Notification Given:  Yes    Shelbie Ammons, RN 07/03/2015, 11:56 AM

## 2015-07-04 LAB — CBC WITH DIFFERENTIAL/PLATELET
BASOS ABS: 0 10*3/uL (ref 0–0.1)
Basophils Relative: 0 %
EOS ABS: 0.1 10*3/uL (ref 0–0.7)
Eosinophils Relative: 9 %
HCT: 26.9 % — ABNORMAL LOW (ref 35.0–47.0)
HEMOGLOBIN: 9.1 g/dL — AB (ref 12.0–16.0)
LYMPHS PCT: 69 %
Lymphs Abs: 0.4 10*3/uL — ABNORMAL LOW (ref 1.0–3.6)
MCH: 31.2 pg (ref 26.0–34.0)
MCHC: 33.7 g/dL (ref 32.0–36.0)
MCV: 92.5 fL (ref 80.0–100.0)
Monocytes Absolute: 0 10*3/uL — ABNORMAL LOW (ref 0.2–0.9)
Monocytes Relative: 8 %
NEUTROS PCT: 14 %
Neutro Abs: 0.1 10*3/uL — ABNORMAL LOW (ref 1.4–6.5)
Platelets: 23 10*3/uL — CL (ref 150–440)
RBC: 2.91 MIL/uL — ABNORMAL LOW (ref 3.80–5.20)
RDW: 16.6 % — ABNORMAL HIGH (ref 11.5–14.5)
WBC: 0.6 10*3/uL — CL (ref 3.6–11.0)

## 2015-07-04 LAB — TYPE AND SCREEN
ABO/RH(D): O POS
Antibody Screen: NEGATIVE
UNIT DIVISION: 0
UNIT DIVISION: 0
UNIT DIVISION: 0
Unit division: 0

## 2015-07-04 NOTE — Plan of Care (Signed)
Problem: Physical Regulation: Goal: Ability to maintain clinical measurements within normal limits will improve Outcome: Not Progressing Hgb still low. Hx of leukemia. AM labs will determine if more blood products will be required.

## 2015-07-04 NOTE — Progress Notes (Signed)
This encounter was created in error - please disregard.

## 2015-07-04 NOTE — Care Management Important Message (Signed)
Important Message  Patient Details  Name: [name] MRN: [id number] Date of Birth: Aug 01, 1935   Medicare Important Message Given:  Yes    Juliann Pulse A Lynel Forester 07/04/2015, 10:30 AM

## 2015-07-05 LAB — COMPREHENSIVE METABOLIC PANEL
ALK PHOS: 68 U/L (ref 38–126)
ALT: 6 U/L — ABNORMAL LOW (ref 14–54)
ANION GAP: 3 — AB (ref 5–15)
AST: 15 U/L (ref 15–41)
Albumin: 2.7 g/dL — ABNORMAL LOW (ref 3.5–5.0)
BILIRUBIN TOTAL: 0.5 mg/dL (ref 0.3–1.2)
BUN: 29 mg/dL — ABNORMAL HIGH (ref 6–20)
CALCIUM: 8.6 mg/dL — AB (ref 8.9–10.3)
CO2: 25 mmol/L (ref 22–32)
Chloride: 112 mmol/L — ABNORMAL HIGH (ref 101–111)
Creatinine, Ser: 1.15 mg/dL — ABNORMAL HIGH (ref 0.44–1.00)
GFR calc Af Amer: 52 mL/min — ABNORMAL LOW (ref >60)
GFR, EST NON AFRICAN AMERICAN: 45 mL/min — AB (ref >60)
Glucose, Bld: 71 mg/dL (ref 65–99)
POTASSIUM: 4.8 mmol/L (ref 3.5–5.1)
Sodium: 140 mmol/L (ref 135–145)
TOTAL PROTEIN: 5.2 g/dL — AB (ref 6.5–8.1)

## 2015-07-06 ENCOUNTER — Emergency Department: Payer: Medicare Other

## 2015-07-06 ENCOUNTER — Inpatient Hospital Stay
Admission: EM | Admit: 2015-07-06 | Discharge: 2015-07-08 | DRG: 603 | Disposition: A | Discharge status: Home / Self Care | Payer: Medicare Other | Attending: Internal Medicine | Admitting: Internal Medicine

## 2015-07-06 ENCOUNTER — Encounter: Payer: Self-pay | Admitting: Internal Medicine

## 2015-07-06 DIAGNOSIS — I5032 Chronic diastolic (congestive) heart failure: Secondary | ICD-10-CM | POA: Diagnosis present

## 2015-07-06 DIAGNOSIS — I2510 Atherosclerotic heart disease of native coronary artery without angina pectoris: Secondary | ICD-10-CM | POA: Diagnosis present

## 2015-07-06 DIAGNOSIS — E119 Type 2 diabetes mellitus without complications: Secondary | ICD-10-CM | POA: Diagnosis present

## 2015-07-06 DIAGNOSIS — Z952 Presence of prosthetic heart valve: Secondary | ICD-10-CM | POA: Diagnosis not present

## 2015-07-06 DIAGNOSIS — L03115 Cellulitis of right lower limb: Principal | ICD-10-CM | POA: Diagnosis present

## 2015-07-06 DIAGNOSIS — I10 Essential (primary) hypertension: Secondary | ICD-10-CM | POA: Diagnosis present

## 2015-07-06 DIAGNOSIS — Z66 Do not resuscitate: Secondary | ICD-10-CM | POA: Diagnosis present

## 2015-07-06 DIAGNOSIS — Z888 Allergy status to other drugs, medicaments and biological substances status: Secondary | ICD-10-CM | POA: Diagnosis not present

## 2015-07-06 DIAGNOSIS — K219 Gastro-esophageal reflux disease without esophagitis: Secondary | ICD-10-CM | POA: Diagnosis present

## 2015-07-06 DIAGNOSIS — C9200 Acute myeloblastic leukemia, not having achieved remission: Secondary | ICD-10-CM | POA: Diagnosis present

## 2015-07-06 DIAGNOSIS — Z87891 Personal history of nicotine dependence: Secondary | ICD-10-CM

## 2015-07-06 DIAGNOSIS — D709 Neutropenia, unspecified: Secondary | ICD-10-CM | POA: Diagnosis present

## 2015-07-06 DIAGNOSIS — H409 Unspecified glaucoma: Secondary | ICD-10-CM | POA: Diagnosis present

## 2015-07-06 DIAGNOSIS — Z950 Presence of cardiac pacemaker: Secondary | ICD-10-CM

## 2015-07-06 DIAGNOSIS — Z7984 Long term (current) use of oral hypoglycemic drugs: Secondary | ICD-10-CM

## 2015-07-06 DIAGNOSIS — Z9221 Personal history of antineoplastic chemotherapy: Secondary | ICD-10-CM | POA: Diagnosis not present

## 2015-07-06 DIAGNOSIS — I4891 Unspecified atrial fibrillation: Secondary | ICD-10-CM | POA: Diagnosis present

## 2015-07-06 DIAGNOSIS — G20 Parkinson's disease: Secondary | ICD-10-CM | POA: Diagnosis present

## 2015-07-06 DIAGNOSIS — Z7901 Long term (current) use of anticoagulants: Secondary | ICD-10-CM

## 2015-07-06 DIAGNOSIS — Z951 Presence of aortocoronary bypass graft: Secondary | ICD-10-CM | POA: Diagnosis not present

## 2015-07-06 DIAGNOSIS — Z79899 Other long term (current) drug therapy: Secondary | ICD-10-CM

## 2015-07-06 DIAGNOSIS — E7800 Pure hypercholesterolemia, unspecified: Secondary | ICD-10-CM | POA: Diagnosis present

## 2015-07-06 DIAGNOSIS — D899 Disorder involving the immune mechanism, unspecified: Secondary | ICD-10-CM | POA: Diagnosis present

## 2015-07-06 DIAGNOSIS — R531 Weakness: Secondary | ICD-10-CM | POA: Diagnosis not present

## 2015-07-06 DIAGNOSIS — E039 Hypothyroidism, unspecified: Secondary | ICD-10-CM | POA: Diagnosis present

## 2015-07-06 DIAGNOSIS — L0390 Cellulitis, unspecified: Secondary | ICD-10-CM | POA: Diagnosis present

## 2015-07-06 DIAGNOSIS — D61818 Other pancytopenia: Secondary | ICD-10-CM | POA: Diagnosis not present

## 2015-07-06 HISTORY — DX: Anemia, unspecified: D64.9

## 2015-07-06 HISTORY — DX: Thrombocytopenia, unspecified: D69.6

## 2015-07-06 HISTORY — DX: Rheumatic heart disease, unspecified: I09.9

## 2015-07-06 HISTORY — DX: Decreased white blood cell count, unspecified: D72.819

## 2015-07-06 LAB — COMPREHENSIVE METABOLIC PANEL
ALK PHOS: 98 U/L (ref 38–126)
ALT: 8 U/L — ABNORMAL LOW (ref 14–54)
ANION GAP: 5 (ref 5–15)
AST: 15 U/L (ref 15–41)
Albumin: 3.2 g/dL — ABNORMAL LOW (ref 3.5–5.0)
BUN: 17 mg/dL (ref 6–20)
CALCIUM: 9.2 mg/dL (ref 8.9–10.3)
CHLORIDE: 108 mmol/L (ref 101–111)
CO2: 26 mmol/L (ref 22–32)
Creatinine, Ser: 0.92 mg/dL (ref 0.44–1.00)
GFR calc non Af Amer: 59 mL/min — ABNORMAL LOW (ref >60)
Glucose, Bld: 163 mg/dL — ABNORMAL HIGH (ref 65–99)
POTASSIUM: 4.3 mmol/L (ref 3.5–5.1)
SODIUM: 139 mmol/L (ref 135–145)
Total Bilirubin: 1.1 mg/dL (ref 0.3–1.2)
Total Protein: 6.7 g/dL (ref 6.5–8.1)

## 2015-07-06 LAB — URINALYSIS COMPLETE WITH MICROSCOPIC (ARMC ONLY)
Bacteria, UA: NONE SEEN
Bilirubin Urine: NEGATIVE
Glucose, UA: NEGATIVE mg/dL
Hgb urine dipstick: NEGATIVE
KETONES UR: NEGATIVE mg/dL
Leukocytes, UA: NEGATIVE
Nitrite: NEGATIVE
PH: 5 (ref 5.0–8.0)
PROTEIN: NEGATIVE mg/dL
RBC / HPF: NONE SEEN RBC/hpf (ref 0–5)
SPECIFIC GRAVITY, URINE: 1.008 (ref 1.005–1.030)
WBC UA: NONE SEEN WBC/hpf (ref 0–5)

## 2015-07-06 LAB — CBC
HEMATOCRIT: 26.9 % — AB (ref 35.0–47.0)
HEMOGLOBIN: 9 g/dL — AB (ref 12.0–16.0)
MCH: 31.6 pg (ref 26.0–34.0)
MCHC: 33.6 g/dL (ref 32.0–36.0)
MCV: 93.9 fL (ref 80.0–100.0)
Platelets: 23 10*3/uL — CL (ref 150–440)
RBC: 2.86 MIL/uL — AB (ref 3.80–5.20)
RDW: 16.5 % — ABNORMAL HIGH (ref 11.5–14.5)
WBC: 0.4 10*3/uL — CL (ref 3.6–11.0)

## 2015-07-06 MED ORDER — ACETAMINOPHEN 650 MG RE SUPP: 650.0000 mg | Freq: Four times a day (QID) | RECTAL | Status: DC | PRN

## 2015-07-06 MED ORDER — SODIUM CHLORIDE 0.9 % IV SOLN
INTRAVENOUS | Status: AC
  Administered 2015-07-06: via INTRAVENOUS

## 2015-07-06 MED ORDER — LATANOPROST 0.005 % OP SOLN
1.0000 [drp] | Freq: Every day | OPHTHALMIC | Status: DC
  Administered 2015-07-06 – 2015-07-07 (×2): 1 [drp] via OPHTHALMIC
  Filled 2015-07-06: qty 2.5

## 2015-07-06 MED ORDER — LEVOTHYROXINE SODIUM 112 MCG PO TABS
112.0000 ug | ORAL_TABLET | Freq: Every day | ORAL | Status: DC
  Administered 2015-07-07 – 2015-07-08 (×2): 112 ug via ORAL
  Filled 2015-07-06 (×2): qty 1

## 2015-07-06 MED ORDER — PANTOPRAZOLE SODIUM 40 MG PO TBEC
40.0000 mg | DELAYED_RELEASE_TABLET | Freq: Two times a day (BID) | ORAL | Status: DC
  Administered 2015-07-06 – 2015-07-08 (×4): 40 mg via ORAL
  Filled 2015-07-06 (×4): qty 1

## 2015-07-06 MED ORDER — POSACONAZOLE 100 MG PO TBEC
300.0000 mg | DELAYED_RELEASE_TABLET | Freq: Every day | ORAL | Status: DC
  Administered 2015-07-07 – 2015-07-08 (×2): 300 mg via ORAL
  Filled 2015-07-06 (×3): qty 3

## 2015-07-06 MED ORDER — ONDANSETRON HCL 4 MG PO TABS: 4.0000 mg | ORAL_TABLET | Freq: Four times a day (QID) | ORAL | Status: DC | PRN

## 2015-07-06 MED ORDER — HYDRALAZINE HCL 25 MG PO TABS
50.0000 mg | ORAL_TABLET | Freq: Two times a day (BID) | ORAL | Status: DC
  Administered 2015-07-06 – 2015-07-08 (×4): 50 mg via ORAL
  Filled 2015-07-06 (×4): qty 2

## 2015-07-06 MED ORDER — SIMVASTATIN 20 MG PO TABS
20.0000 mg | ORAL_TABLET | Freq: Every day | ORAL | Status: DC
  Administered 2015-07-06 – 2015-07-07 (×2): 20 mg via ORAL
  Filled 2015-07-06 (×2): qty 1

## 2015-07-06 MED ORDER — VANCOMYCIN HCL IN DEXTROSE 1-5 GM/200ML-% IV SOLN
1000.0000 mg | Freq: Once | INTRAVENOUS | Status: AC
  Administered 2015-07-06: 1000 mg via INTRAVENOUS
  Filled 2015-07-06: qty 200

## 2015-07-06 MED ORDER — ACETAMINOPHEN 325 MG PO TABS: 650.0000 mg | ORAL_TABLET | Freq: Four times a day (QID) | ORAL | Status: DC | PRN

## 2015-07-06 MED ORDER — VITAMIN D 1000 UNITS PO TABS
1000.0000 [IU] | ORAL_TABLET | Freq: Every day | ORAL | Status: DC
  Administered 2015-07-07 – 2015-07-08 (×2): 1000 [IU] via ORAL
  Filled 2015-07-06 (×2): qty 1

## 2015-07-06 MED ORDER — VALACYCLOVIR HCL 500 MG PO TABS
500.0000 mg | ORAL_TABLET | Freq: Every day | ORAL | Status: DC
  Administered 2015-07-07 – 2015-07-08 (×2): 500 mg via ORAL
  Filled 2015-07-06 (×2): qty 1

## 2015-07-06 MED ORDER — POTASSIUM CHLORIDE CRYS ER 10 MEQ PO TBCR
10.0000 meq | EXTENDED_RELEASE_TABLET | Freq: Two times a day (BID) | ORAL | Status: DC
  Administered 2015-07-06 – 2015-07-08 (×4): 10 meq via ORAL
  Filled 2015-07-06 (×6): qty 1

## 2015-07-06 MED ORDER — HYDRALAZINE HCL 20 MG/ML IJ SOLN
INTRAMUSCULAR | Status: AC
  Administered 2015-07-06: 10 mg via INTRAVENOUS
  Filled 2015-07-06: qty 1

## 2015-07-06 MED ORDER — LOSARTAN POTASSIUM 50 MG PO TABS
50.0000 mg | ORAL_TABLET | Freq: Every day | ORAL | Status: DC
  Administered 2015-07-07 – 2015-07-08 (×2): 50 mg via ORAL
  Filled 2015-07-06 (×2): qty 1

## 2015-07-06 MED ORDER — BRIMONIDINE TARTRATE 0.15 % OP SOLN
1.0000 [drp] | Freq: Two times a day (BID) | OPHTHALMIC | Status: DC
  Administered 2015-07-06 – 2015-07-08 (×4): 1 [drp] via OPHTHALMIC
  Filled 2015-07-06: qty 5

## 2015-07-06 MED ORDER — CYANOCOBALAMIN 500 MCG PO TABS
250.0000 ug | ORAL_TABLET | Freq: Every day | ORAL | Status: DC
  Administered 2015-07-07 – 2015-07-08 (×2): 250 ug via ORAL
  Filled 2015-07-06 (×3): qty 1

## 2015-07-06 MED ORDER — ISOSORBIDE DINITRATE 10 MG PO TABS
30.0000 mg | ORAL_TABLET | Freq: Two times a day (BID) | ORAL | Status: DC
  Administered 2015-07-06 – 2015-07-08 (×4): 30 mg via ORAL
  Filled 2015-07-06 (×4): qty 3

## 2015-07-06 MED ORDER — SOTALOL HCL 80 MG PO TABS
40.0000 mg | ORAL_TABLET | Freq: Every day | ORAL | Status: DC
  Administered 2015-07-07 – 2015-07-08 (×2): 40 mg via ORAL
  Filled 2015-07-06 (×2): qty 1

## 2015-07-06 MED ORDER — HYDRALAZINE HCL 20 MG/ML IJ SOLN
10.0000 mg | INTRAMUSCULAR | Status: DC | PRN
  Administered 2015-07-06: 10 mg via INTRAVENOUS

## 2015-07-06 MED ORDER — ONDANSETRON HCL 4 MG/2ML IJ SOLN: 4.0000 mg | Freq: Four times a day (QID) | INTRAMUSCULAR | Status: DC | PRN

## 2015-07-06 MED ORDER — SODIUM CHLORIDE 0.9 % IV SOLN
1.5000 g | Freq: Four times a day (QID) | INTRAVENOUS | Status: DC
  Administered 2015-07-06 – 2015-07-08 (×7): 1.5 g via INTRAVENOUS
  Filled 2015-07-06 (×10): qty 1.5

## 2015-07-06 MED ORDER — VANCOMYCIN HCL IN DEXTROSE 750-5 MG/150ML-% IV SOLN
750.0000 mg | INTRAVENOUS | Status: DC
  Administered 2015-07-07 – 2015-07-08 (×2): 750 mg via INTRAVENOUS
  Filled 2015-07-06 (×2): qty 150

## 2015-07-06 MED ORDER — FERROUS SULFATE 325 (65 FE) MG PO TABS
325.0000 mg | ORAL_TABLET | Freq: Every day | ORAL | Status: DC
  Administered 2015-07-07 – 2015-07-08 (×2): 325 mg via ORAL
  Filled 2015-07-06 (×2): qty 1

## 2015-07-06 MED ORDER — FUROSEMIDE 20 MG PO TABS
20.0000 mg | ORAL_TABLET | Freq: Every day | ORAL | Status: DC
  Administered 2015-07-07 – 2015-07-08 (×2): 20 mg via ORAL
  Filled 2015-07-06 (×2): qty 1

## 2015-07-06 MED ORDER — CARBIDOPA-LEVODOPA 25-100 MG PO TABS
1.0000 | ORAL_TABLET | Freq: Four times a day (QID) | ORAL | Status: DC
  Administered 2015-07-06 – 2015-07-08 (×6): 1 via ORAL
  Filled 2015-07-06 (×6): qty 1

## 2015-07-06 MED ORDER — DOCUSATE SODIUM 100 MG PO CAPS
100.0000 mg | ORAL_CAPSULE | Freq: Two times a day (BID) | ORAL | Status: DC
  Administered 2015-07-06 – 2015-07-08 (×4): 100 mg via ORAL
  Filled 2015-07-06 (×4): qty 1

## 2015-07-06 NOTE — H&P (Signed)
[facility] NAME: [name]    MR#:  [id number]  DATE OF BIRTH:  1935/06/12  DATE OF ADMISSION:  07/06/2015  PRIMARY CARE PHYSICIAN: Adin Hector, MD   REQUESTING/REFERRING PHYSICIAN: Dr. Meade Maw  CHIEF COMPLAINT:   Chief Complaint  Patient presents with  . Cellulitis    HISTORY OF PRESENT ILLNESS:  [name]  is a 80 y.o. female with a known history of AML status post 4 rounds of chemotherapy and currently refusing chemotherapy due to side effects, history of mechanical mitral valve surgery due to history of rheumatic heart disease, chronic leukopenia and thrombocytopenia and anemia and recent admission last week for transfusion, diabetes and hypertension presents to the hospital secondary to right leg swelling and redness. Patient was just in the hospital last week for hemoglobin less than 5 and received 2 units transfusion and got discharged with home health services. She has chronic leukopenia with white count less than 1k, platelets in the 20k range. She did not qualify for hospice. Is being followed by home health services. Patient is a poor historian, husband at bedside. The home health nurse came in today to change PICC line dressing and noted that her right leg was more swollen and red and tender to touch. She was sent in to the hospital. Because her WBC count is still low, she is neutropenic she is being admitted for cellulitis and IV antibiotics. Dopplers have been done to rule out any DVT. Patient says she has both lower extremity swelling going on for a while but the right one has been more swollen lately. She cannot specify for how long she has noticed it to be swollen.  PAST MEDICAL HISTORY:   Past Medical History  Diagnosis Date  . Atrial fibrillation ([place])   . Parkinson's disease ([place])   . Glaucoma   . Hypertension   . Hypothyroidism   . GERD (gastroesophageal reflux disease)   . High  cholesterol   . Diabetes mellitus without complication ([place])   . CAD (coronary artery disease) of bypass graft   . History of appendectomy   . H/O: hysterectomy   . Leukemia ([place])     AML diagnosed 2016- refused treatment  . Rheumatic heart disease     s/p mitral stenosis  . Chronic leukopenia   . Thrombocytopenia ([place])   . Anemia     PAST SURGICAL HISTORY:   Past Surgical History  Procedure Laterality Date  . Cardiac valve replacement      mitral valve replacement with mechanical valve  . Pacemaker insertion    . Peripheral vascular catheterization N/A 05/10/2015    Procedure: PICC Line Insertion;  Surgeon: Katha Cabal, MD;  Location: [facility];  Service: Cardiovascular;  Laterality: N/A;  . Abdominal hysterectomy    . Appendectomy      SOCIAL HISTORY:   Social History  Substance Use Topics  . Smoking status: Former Smoker -- 1.00 packs/day for 10 years    Types: Cigarettes  . Smokeless tobacco: Not on file     Comment: quit about 5 years ago  . Alcohol Use: No    FAMILY HISTORY:   Family History  Problem Relation Age of Onset  . CAD Other   . Heart disease Father     DRUG ALLERGIES:   Allergies  Allergen Reactions  . Hydrochlorothiazide Other (See Comments)    Reaction:  Worsening hypercalcaemia  . Lisinopril Other (  See Comments) and Cough    Reaction:  Worsening renal insufficiency at 40mg  dose.       REVIEW OF SYSTEMS:   Review of Systems  Constitutional: Positive for weight loss and malaise/fatigue. Negative for fever and chills.  HENT: Negative for ear discharge, ear pain, hearing loss and nosebleeds.   Eyes: Negative for blurred vision, double vision and photophobia.  Respiratory: Negative for cough, hemoptysis, shortness of breath and wheezing.   Cardiovascular: Positive for leg swelling. Negative for chest pain, palpitations and orthopnea.  Gastrointestinal: Negative for heartburn, nausea, vomiting, abdominal pain, diarrhea,  constipation and melena.  Genitourinary: Negative for dysuria, urgency, frequency and hematuria.  Musculoskeletal: Negative for myalgias, back pain and neck pain.       Right leg tender and erythematous  Skin: Negative for rash.  Neurological: Positive for weakness. Negative for dizziness, tremors, sensory change, speech change, focal weakness and headaches.  Endo/Heme/Allergies: Does not bruise/bleed easily.  Psychiatric/Behavioral: Negative for depression.    MEDICATIONS AT HOME:   Prior to Admission medications   Medication Sig Start Date End Date Taking? Authorizing Provider  acetaminophen (TYLENOL) 325 MG tablet Take 650 mg by mouth every 4 (four) hours as needed for mild pain or headache.    Historical Provider, MD  brimonidine (ALPHAGAN) 0.15 % ophthalmic solution Place 1 drop into both eyes 2 (two) times daily.    Historical Provider, MD  carbidopa-levodopa (SINEMET IR) 25-100 MG per tablet Take 1 tablet by mouth 4 (four) times daily. 12/26/14   Tama High III, MD  cholecalciferol (VITAMIN D) 1000 UNITS tablet Take 1,000 Units by mouth daily.    Historical Provider, MD  ferrous sulfate 325 (65 FE) MG tablet Take 325 mg by mouth daily.     Historical Provider, MD  furosemide (LASIX) 40 MG tablet Take 20 mg by mouth daily.    Historical Provider, MD  glipiZIDE (GLUCOTROL XL) 5 MG 24 hr tablet Take 5 mg by mouth daily with breakfast.    Historical Provider, MD  hydrALAZINE (APRESOLINE) 50 MG tablet Take 50 mg by mouth 2 (two) times daily.    Historical Provider, MD  isosorbide dinitrate (ISORDIL) 30 MG tablet Take 30 mg by mouth 2 (two) times daily.    Historical Provider, MD  latanoprost (XALATAN) 0.005 % ophthalmic solution Place 1 drop into both eyes at bedtime.    Historical Provider, MD  levothyroxine (SYNTHROID, LEVOTHROID) 112 MCG tablet Take 1 tablet (112 mcg total) by mouth daily before breakfast. 01/03/15   Tama High III, MD  losartan (COZAAR) 50 MG tablet Take 1 tablet (50  mg total) by mouth daily. 01/03/15   Tama High III, MD  omeprazole (PRILOSEC) 40 MG capsule Take 40 mg by mouth 2 (two) times daily.    Historical Provider, MD  posaconazole (NOXAFIL) 100 MG TBEC delayed-release tablet Take 300 mg by mouth daily. Reported on 07/02/2015    Historical Provider, MD  potassium chloride (K-DUR) 10 MEQ tablet Take 10 mEq by mouth 2 (two) times daily.    Historical Provider, MD  potassium chloride SA (K-DUR,KLOR-CON) 20 MEQ tablet Take 1 tablet (20 mEq total) by mouth 2 (two) times daily. Patient not taking: Reported on 07/02/2015 04/23/15   Lloyd Huger, MD  prochlorperazine (COMPAZINE) 10 MG tablet Take 1 tablet (10 mg total) by mouth every 6 (six) hours as needed (Nausea or vomiting). Patient not taking: Reported on 07/02/2015 10/22/14   Lloyd Huger, MD  simvastatin (ZOCOR) 20  MG tablet TAKE 1 TABLET AT BEDTIME 04/20/14   Historical Provider, MD  sotalol (BETAPACE) 80 MG tablet Take 40 mg by mouth. 01/28/15 01/28/16  Historical Provider, MD  valACYclovir (VALTREX) 500 MG tablet Take 500 mg by mouth daily. Reported on 07/02/2015    Historical Provider, MD  vitamin B-12 (CYANOCOBALAMIN) 250 MCG tablet Take 250 mcg by mouth daily.    Historical Provider, MD      VITAL SIGNS:  Blood pressure 173/72, pulse 63, temperature 97.7 F (36.5 C), temperature source Oral, resp. rate 15, height 5\' 3"  (1.6 m), weight 53.071 kg (117 lb), SpO2 96 %.  PHYSICAL EXAMINATION:   Physical Exam  GENERAL:  80 y.o.-year-old patient lying in the bed with no acute distress. Extremely weak appearing, fatigued and in bed. EYES: Pupils equal, round, reactive to light and accommodation. No scleral icterus. Extraocular muscles intact.  HEENT: Head atraumatic, normocephalic. Oropharynx and nasopharynx clear. Edentulous NECK:  Supple, no jugular venous distention. No thyroid enlargement, no tenderness.  LUNGS: Normal breath sounds bilaterally, no wheezing, rales,rhonchi or crepitation.  No use of accessory muscles of respiration. Increased bibasilar breath sounds. CARDIOVASCULAR: S1, S2 normal. No rubs, or gallops. /6 loud systolic murmur in the mitral area noted. ABDOMEN: Soft, nontender, nondistended. Bowel sounds present. No organomegaly or mass.  EXTREMITIES: No cyanosis, or clubbing. 2+ pedal edema left leg, 3+ edema to right lower extremities with erythema and tenderness noted. NEUROLOGIC: Cranial nerves II through XII are intact. Muscle strength 5/5 in all extremities. Sensation intact. Gait not checked. Generalized weakness is noted. PSYCHIATRIC: The patient is alert and oriented x 3.  SKIN: No obvious rash, lesion, or ulcer.   LABORATORY PANEL:   CBC  Recent Labs Lab 07/06/15 1316  WBC 0.4*  HGB 9.0*  HCT 26.9*  PLT 23*   ------------------------------------------------------------------------------------------------------------------  Chemistries   Recent Labs Lab 07/06/15 1316  NA 139  K 4.3  CL 108  CO2 26  GLUCOSE 163*  BUN 17  CREATININE 0.92  CALCIUM 9.2  AST 15  ALT 8*  ALKPHOS 98  BILITOT 1.1   ------------------------------------------------------------------------------------------------------------------  Cardiac Enzymes No results for input(s): TROPONINI in the last 168 hours. ------------------------------------------------------------------------------------------------------------------  RADIOLOGY:  US Venous Img Lower Unilateral Right  07/06/2015  CLINICAL DATA:  Right leg swelling EXAM: RIGHT LOWER EXTREMITY VENOUS DOPPLER ULTRASOUND TECHNIQUE: Gray-scale sonography with graded compression, as well as color Doppler and duplex ultrasound were performed to evaluate the lower extremity deep venous systems from the level of the common femoral vein and including the common femoral, femoral, profunda femoral, popliteal and calf veins including the posterior tibial, peroneal and gastrocnemius veins when visible. The superficial  great saphenous vein was also interrogated. Spectral Doppler was utilized to evaluate flow at rest and with distal augmentation maneuvers in the common femoral, femoral and popliteal veins. COMPARISON:  None. FINDINGS: Contralateral Common Femoral Vein: Respiratory phasicity is normal and symmetric with the symptomatic side. No evidence of thrombus. Normal compressibility. Common Femoral Vein: No evidence of thrombus. Normal compressibility, respiratory phasicity and response to augmentation. Saphenofemoral Junction: No evidence of thrombus. Normal compressibility and flow on color Doppler imaging. Profunda Femoral Vein: No evidence of thrombus. Normal compressibility and flow on color Doppler imaging. Femoral Vein: No evidence of thrombus. Normal compressibility, respiratory phasicity and response to augmentation. Popliteal Vein: No evidence of thrombus. Normal compressibility, respiratory phasicity and response to augmentation. Calf Veins: No evidence of thrombus. Normal compressibility and flow on color Doppler imaging. Superficial Great Saphenous Vein: No  evidence of thrombus. Normal compressibility and flow on color Doppler imaging. Venous Reflux:  None. Other Findings:  None. IMPRESSION: No evidence of deep venous thrombosis. Electronically Signed   By: Rolm Baptise M.D.   On: 07/06/2015 15:04    EKG:   Orders placed or performed in visit on 02/12/15  . EKG 12-Lead    IMPRESSION AND PLAN:   [name]  is a 80 y.o. female with a known history of AML status post 4 rounds of chemotherapy and currently refusing chemotherapy due to side effects, history of mechanical mitral valve surgery due to history of rheumatic heart disease, chronic leukopenia and thrombocytopenia and anemia and recent admission last week for transfusion, diabetes and hypertension presents to the hospital secondary to right leg swelling and redness.  #1 Right lower extremity cellulitis- dopplers with no evidence of DVT - due  to decreased immunity- will start vanc and unasyn for now - keep the leg elevated - poor IV access, so continue PICC line  #2 Pancytopenia- stable, wbc at 0.4 and stable since the last 3 weeks, platelets stable around 23k - hold transfusion for now - oncology consulted  #3 diabetes mellitus- SSI, hold glipizide until oral intake is improved.  #4 atrial fibrillation-continue sotalol. Rate is controlled. Not on any anticoagulation due to anemia and thrombocytopenia  #5 Mitral valve replacement- She was on Coumadin up until her AML was diagnosed and she started getting anemic and requiring transfusions. She was on Lovenox up until recently  and now Lovenox has been discontinued due to anemia and thrombocytopenia  #6 hypertension-continue home medications.  #7 Parkinson's disease- cotninue Sinemet  #8 DVT prophylaxis-Ted's and SCDs. With her anemia and thrombocytopenia, we'll not start any heparin products.   All the records are reviewed and case discussed with ED provider. Management plans discussed with the patient, family and they are in agreement.  CODE STATUS: DNR- Discussed with patient and her husband at bedside on admission.  TOTAL TIME TAKING CARE OF THIS PATIENT: 50 minutes.    Gladstone Lighter M.D on 07/06/2015 at 5:53 PM  Between 7am to 6pm - Pager - [phone]  After 6pm go to [email or web address] - password [email or web address] [facility]  [facility]  Office  [phone]  CC: Primary care physician; Adin Hector, MD

## 2015-07-06 NOTE — ED Notes (Signed)
Pt is a Cancer pt currently being treated for Leukemia. Her home health nurse discovered a red swollen area to RLE that pt states she first noticed today. Denies fever

## 2015-07-06 NOTE — ED Notes (Signed)
Called floor to let them know patient on the way up

## 2015-07-06 NOTE — Consult Note (Signed)
ANTIBIOTIC CONSULT NOTE - INITIAL  Pharmacy Consult for unasyn Indication: cellulitis  Allergies  Allergen Reactions  . Hydrochlorothiazide Other (See Comments)    Reaction:  Worsening hypercalcaemia  . Lisinopril Other (See Comments) and Cough    Reaction:  Worsening renal insufficiency at 40mg  dose.       Patient Measurements: Height: 5\' 3"  (160 cm) Weight: 117 lb (53.071 kg) IBW/kg (Calculated) : 52.4 Adjusted Body Weight:   Vital Signs: Temp: 98.2 F (36.8 C) (01/28 1955) Temp Source: Oral (01/28 1955) BP: 163/57 mmHg (01/28 1955) Pulse Rate: 59 (01/28 1955) Intake/Output from previous day:   Intake/Output from this shift:    Labs:  Recent Labs  07/04/15 0647 07/06/15 1316  WBC 0.6* 0.4*  HGB 9.1* 9.0*  PLT 23* 23*  CREATININE  --  0.92   Estimated Creatinine Clearance: 42.4 mL/min (by C-G formula based on Cr of 0.92). No results for input(s): VANCOTROUGH, VANCOPEAK, VANCORANDOM, GENTTROUGH, GENTPEAK, GENTRANDOM, TOBRATROUGH, TOBRAPEAK, TOBRARND, AMIKACINPEAK, AMIKACINTROU, AMIKACIN in the last 72 hours.   Microbiology: No results found for this or any previous visit (from the past 720 hour(s)).  Medical History: Past Medical History  Diagnosis Date  . Atrial fibrillation ([place])   . Parkinson's disease ([place])   . Glaucoma   . Hypertension   . Hypothyroidism   . GERD (gastroesophageal reflux disease)   . High cholesterol   . Diabetes mellitus without complication ([place])   . CAD (coronary artery disease) of bypass graft   . History of appendectomy   . H/O: hysterectomy   . Leukemia ([place])     AML diagnosed 2016- refused treatment  . Rheumatic heart disease     s/p mitral stenosis  . Chronic leukopenia   . Thrombocytopenia ([place])   . Anemia     Medications:  Scheduled:  . brimonidine  1 drop Both Eyes BID  . carbidopa-levodopa  1 tablet Oral QID  . [START ON 07/07/2015] cholecalciferol  1,000 Units Oral Daily  . [START ON 07/07/2015]  cyanocobalamin  250 mcg Oral Daily  . docusate sodium  100 mg Oral BID  . ferrous sulfate  325 mg Oral Daily  . [START ON 07/07/2015] furosemide  20 mg Oral Daily  . hydrALAZINE  50 mg Oral BID  . isosorbide dinitrate  30 mg Oral BID  . latanoprost  1 drop Both Eyes QHS  . [START ON 07/07/2015] levothyroxine  112 mcg Oral QAC breakfast  . losartan  50 mg Oral Daily  . pantoprazole  40 mg Oral BID  . posaconazole  300 mg Oral Daily  . potassium chloride  10 mEq Oral BID  . simvastatin  20 mg Oral QHS  . [START ON 07/07/2015] sotalol  40 mg Oral Daily  . valACYclovir  500 mg Oral Daily  . [START ON 07/07/2015] vancomycin  750 mg Intravenous Q24H   Assessment: Pt is a 80 year old immunocompromised female with cellulitis. Pharmacy is consulted to dose amp/sul. Pt is already on vancomycin  Goal of Therapy:  resolution of infection  Plan:  Follow up culture results amp/sul 1.5g q 6 hours. pharmacy to continue to monitor. thank you for the consult  Ramond Dial 07/06/2015,9:12 PM

## 2015-07-06 NOTE — Consult Note (Signed)
ANTIBIOTIC CONSULT NOTE - INITIAL  Pharmacy Consult for vancomycin Indication: cellulitis  Allergies  Allergen Reactions  . Hydrochlorothiazide Other (See Comments)    Reaction:  Worsening hypercalcaemia  . Lisinopril Other (See Comments) and Cough    Reaction:  Worsening renal insufficiency at 40mg  dose.       Patient Measurements: Height: 5\' 3"  (160 cm) Weight: 117 lb (53.071 kg) IBW/kg (Calculated) : 52.4 Adjusted Body Weight:   Vital Signs: Temp: 97.7 F (36.5 C) (01/28 1305) Temp Source: Oral (01/28 1305) BP: 185/74 mmHg (01/28 1305) Pulse Rate: 59 (01/28 1305) Intake/Output from previous day:   Intake/Output from this shift:    Labs:  Recent Labs  07/04/15 0647 07/06/15 1316  WBC 0.6* 0.4*  HGB 9.1* 9.0*  PLT 23* 23*  CREATININE  --  0.92   Estimated Creatinine Clearance: 42.4 mL/min (by C-G formula based on Cr of 0.92). No results for input(s): VANCOTROUGH, VANCOPEAK, VANCORANDOM, GENTTROUGH, GENTPEAK, GENTRANDOM, TOBRATROUGH, TOBRAPEAK, TOBRARND, AMIKACINPEAK, AMIKACINTROU, AMIKACIN in the last 72 hours.   Microbiology: No results found for this or any previous visit (from the past 720 hour(s)).  Medical History: Past Medical History  Diagnosis Date  . Atrial fibrillation ([place])   . Parkinson's disease ([place])   . Glaucoma   . Hypertension   . Hypothyroidism   . GERD (gastroesophageal reflux disease)   . High cholesterol   . Diabetes mellitus without complication ([place])   . CAD (coronary artery disease) of bypass graft   . History of appendectomy   . H/O: hysterectomy   . Leukemia ([place])     Medications:  Scheduled:   Assessment: Pt is a 80 year old female currently being tx for leukemia. Home health nurse discovered swollen RLE. Pharmacy is consulted to dose vancomycin for cellulitis. Pt received one dose of vancomycin 1g in the ED. Ke=0.039 T1/2=17hr Vd=31.17  Goal of Therapy:  Vancomycin trough level 10-15 mcg/ml  Plan:  Measure  antibiotic drug levels at steady state Follow up culture results vancomycin 750mg  q 24 hours starting 21 hours after inital dose for stack dosing.Will measure trough prior to 5th dose 0202 @ [place] 07/06/2015,2:31 PM

## 2015-07-06 NOTE — Discharge Summary (Signed)
[facility]  [phone] Fax:([phone]) [phone]  ID: [name] OB: [birth year]  MR#: [id number]  XTA#:[id number]  Patient Care Team: Adin Hector, MD as PCP - General (Internal Medicine)  CHIEF COMPLAINT: Symptomatic anemia, AML.  INTERVAL HISTORY: Patient felt symptomatically improved after receiving 4 units of packed red blood cells. Her performance status is stable. She denies any fevers. She has no neurologic complaints. She has a fair appetite. She denies any chest pain or shortness of breath. She has no nausea, vomiting, constipation, or diarrhea. She denies any easy bleeding or bruising. Patient offers no further specific complaints on day of discharge.  REVIEW OF SYSTEMS:   Review of Systems  Constitutional: Positive for malaise/fatigue. Negative for fever.  Respiratory: Negative.  Negative for shortness of breath.   Cardiovascular: Negative.  Negative for chest pain.  Gastrointestinal: Negative.   Genitourinary: Negative.   Musculoskeletal: Negative.   Neurological: Positive for weakness.    As per HPI. Otherwise, a complete review of systems is negatve.  PAST MEDICAL HISTORY: Past Medical History  Diagnosis Date  . Atrial fibrillation ([place])   . Parkinson's disease ([place])   . Glaucoma   . Hypertension   . Hypothyroidism   . GERD (gastroesophageal reflux disease)   . High cholesterol   . Diabetes mellitus without complication ([place])   . CAD (coronary artery disease) of bypass graft   . History of appendectomy   . H/O: hysterectomy   . Leukemia ([place])     PAST SURGICAL HISTORY: Past Surgical History  Procedure Laterality Date  . Cardiac valve replacement    . Pacemaker insertion    . Peripheral vascular catheterization N/A 05/10/2015    Procedure: PICC Line Insertion;  Surgeon: Katha Cabal, MD;  Location: [facility];  Service: Cardiovascular;  Laterality: N/A;    FAMILY HISTORY Family History  Problem  Relation Age of Onset  . CAD Other   . Heart disease Father        ADVANCED DIRECTIVES:    HEALTH MAINTENANCE: Social History  Substance Use Topics  . Smoking status: Former Smoker -- 1.00 packs/day for 10 years    Types: Cigarettes  . Smokeless tobacco: None  . Alcohol Use: No     Colonoscopy:  PAP:  Bone density:  Lipid panel:  Allergies  Allergen Reactions  . Hydrochlorothiazide Other (See Comments)    Reaction:  Worsening hypercalcaemia  . Lisinopril Other (See Comments) and Cough    Reaction:  Worsening renal insufficiency at 14m dose.       No current facility-administered medications for this encounter.   Current Outpatient Prescriptions  Medication Sig Dispense Refill  . acetaminophen (TYLENOL) 325 MG tablet Take 650 mg by mouth every 4 (four) hours as needed for mild pain or headache.    . brimonidine (ALPHAGAN) 0.15 % ophthalmic solution Place 1 drop into both eyes 2 (two) times daily.    . carbidopa-levodopa (SINEMET IR) 25-100 MG per tablet Take 1 tablet by mouth 4 (four) times daily. 120 tablet 11  . cholecalciferol (VITAMIN D) 1000 UNITS tablet Take 1,000 Units by mouth daily.    . ferrous sulfate 325 (65 FE) MG tablet Take 325 mg by mouth daily.     . furosemide (LASIX) 40 MG tablet Take 20 mg by mouth daily.    .Marland KitchenglipiZIDE (GLUCOTROL XL) 5 MG 24 hr tablet Take 5 mg by mouth daily with breakfast.    . hydrALAZINE (APRESOLINE) 50 MG  tablet Take 50 mg by mouth 2 (two) times daily.    . isosorbide dinitrate (ISORDIL) 30 MG tablet Take 30 mg by mouth 2 (two) times daily.    Marland Kitchen latanoprost (XALATAN) 0.005 % ophthalmic solution Place 1 drop into both eyes at bedtime.    Marland Kitchen levothyroxine (SYNTHROID, LEVOTHROID) 112 MCG tablet Take 1 tablet (112 mcg total) by mouth daily before breakfast. 30 tablet 11  . losartan (COZAAR) 50 MG tablet Take 1 tablet (50 mg total) by mouth daily. 30 tablet 11  . omeprazole (PRILOSEC) 40 MG capsule Take 40 mg by mouth 2 (two) times  daily.    . potassium chloride (K-DUR) 10 MEQ tablet Take 10 mEq by mouth 2 (two) times daily.    . simvastatin (ZOCOR) 20 MG tablet TAKE 1 TABLET AT BEDTIME    . sotalol (BETAPACE) 80 MG tablet Take 40 mg by mouth.    . vitamin B-12 (CYANOCOBALAMIN) 250 MCG tablet Take 250 mcg by mouth daily.    . posaconazole (NOXAFIL) 100 MG TBEC delayed-release tablet Take 300 mg by mouth daily. Reported on 07/02/2015    . potassium chloride SA (K-DUR,KLOR-CON) 20 MEQ tablet Take 1 tablet (20 mEq total) by mouth 2 (two) times daily. (Patient not taking: Reported on 07/02/2015) 180 tablet 0  . prochlorperazine (COMPAZINE) 10 MG tablet Take 1 tablet (10 mg total) by mouth every 6 (six) hours as needed (Nausea or vomiting). (Patient not taking: Reported on 07/02/2015) 30 tablet 1  . valACYclovir (VALTREX) 500 MG tablet Take 500 mg by mouth daily. Reported on 07/02/2015     Facility-Administered Medications Ordered in Other Encounters  Medication Dose Route Frequency Provider Last Rate Last Dose  . 0.9 %  sodium chloride infusion   Intravenous Continuous Lloyd Huger, MD 999 mL/hr at 10/29/14 1550    . heparin flush 10 UNIT/ML injection 10 Units  10 Units Intravenous Once Lloyd Huger, MD      . sodium chloride 0.9 % injection 10 mL  10 mL Intracatheter PRN Lloyd Huger, MD   10 mL at 06/04/15 1609  . sodium chloride 0.9 % injection 10 mL  10 mL Intracatheter PRN Lloyd Huger, MD      . vancomycin (VANCOCIN) IVPB 1000 mg/200 mL premix  1,000 mg Intravenous Once Daymon Larsen, MD        OBJECTIVE: Filed Vitals:   07/04/15 0940 07/04/15 1330  BP: 175/62 144/47  Pulse: 58 59  Temp:  99.3 F (37.4 C)  Resp:  18     Body mass index is 21.45 kg/(m^2).    ECOG FS:2 - Symptomatic, <50% confined to bed  General: Well-developed, well-nourished, no acute distress. Eyes: Pink conjunctiva, anicteric sclera. Lungs: Clear to auscultation bilaterally. Heart: Regular rate and rhythm. No rubs,  murmurs, or gallops. Abdomen: Soft, nontender, nondistended. No organomegaly noted, normoactive bowel sounds. Musculoskeletal: No edema, cyanosis, or clubbing. Neuro: Alert, answering all questions appropriately. Cranial nerves grossly intact. Skin: No rashes or petechiae noted. Psych: Normal affect.    LAB RESULTS:  Lab Results  Component Value Date   NA 139 07/06/2015   K 4.3 07/06/2015   CL 108 07/06/2015   CO2 26 07/06/2015   GLUCOSE 163* 07/06/2015   BUN 17 07/06/2015   CREATININE 0.92 07/06/2015   CALCIUM 9.2 07/06/2015   PROT 6.7 07/06/2015   ALBUMIN 3.2* 07/06/2015   AST 15 07/06/2015   ALT 8* 07/06/2015   ALKPHOS 98 07/06/2015   BILITOT  1.1 07/06/2015   GFRNONAA 59* 07/06/2015   GFRAA >60 07/06/2015    Lab Results  Component Value Date   WBC 0.4* 07/06/2015   NEUTROABS 0.1* 07/04/2015   HGB 9.0* 07/06/2015   HCT 26.9* 07/06/2015   MCV 93.9 07/06/2015   PLT 23* 07/06/2015     STUDIES: No results found.  ASSESSMENT: Symptomatic anemia, pancytopenia, AML.  PLAN:    1. AML: Given her worsening pancytopenia, there is suspicion of progression of disease. Patient continues to decline additional treatment at this time. She is not ready to enroll in hospice either. No intervention or treatment is planned at this time. Of note, her most recent bone marrow biopsy was improved, but still contained AML at approximately 19% of her sample. Patient does not wish to have another bone marrow biopsy at this time. She completed 4 cycles of azacitidine 75 mg/m IV in June 2016. If she reinitiates treatment, she will receive this treatment on days 1 through 5 as well as on day 8 and 9.  2. Thrombocytopenia: Platelet count is decreased, but stable. Likely secondary to progressive disease. Continue to hold Lovenox. 3. Anemia: Significantly improved after 4 units of packed red blood cells. No further transfusions needed.  4. Neutropenia: Likely secondary to progressive disease,  monitor. 5. Poor vascular access: PICC line in place with home health as needed. 6. Disposition: Discharge home today.   Lloyd Huger, MD   07/06/2015 2:40 PM

## 2015-07-06 NOTE — ED Provider Notes (Signed)
Time Seen: Approximately 1340 I have reviewed the triage notes  Chief Complaint: Cellulitis   History of Present Illness: [name] is a 80 y.o. female who presents with some increased redness on both lower extremities but especially the right lower extremity. Patient was recently hospitalized and has had a previous history of cellulitis. They've noted some swelling in both lower extremities which is not unusual for the patient and states actually the swelling is decreased. She does have more swelling in her right leg compared to left. Patient was seen by a home health nurse identified the redness which was orally identified today. Patient denies any chest pain or shortness of breath. Patient denies any abdominal pain, etc. She has history of acute myeloid leukemia and is currently in remission.   Past Medical History  Diagnosis Date  . Atrial fibrillation ([place])   . Parkinson's disease ([place])   . Glaucoma   . Hypertension   . Hypothyroidism   . GERD (gastroesophageal reflux disease)   . High cholesterol   . Diabetes mellitus without complication ([place])   . CAD (coronary artery disease) of bypass graft   . History of appendectomy   . H/O: hysterectomy   . Leukemia ([place])     AML diagnosed 2016- refused treatment  . Rheumatic heart disease     s/p mitral stenosis  . Chronic leukopenia   . Thrombocytopenia ([place])   . Anemia     Patient Active Problem List   Diagnosis Date Noted  . Cellulitis 07/06/2015  . Acute myeloid leukemia not having achieved remission ([place])   . Anemia 07/02/2015  . Pulmonary edema 12/30/2014  . Chronic diastolic congestive heart failure ([place]) 12/26/2014  . Malnutrition of moderate degree ([place]) 12/23/2014  . Weakness 12/21/2014  . AML (acute myeloblastic leukemia) ([place])   . Atrial fibrillation ([place]) 10/31/2014  . Mechanical heart valve present 10/31/2014  . Coronary artery disease 10/31/2014  . Essential hypertension 10/31/2014  . Antineoplastic  chemotherapy induced pancytopenia ([place]) 10/31/2014    Past Surgical History  Procedure Laterality Date  . Cardiac valve replacement      mitral valve replacement with mechanical valve  . Pacemaker insertion    . Peripheral vascular catheterization N/A 05/10/2015    Procedure: PICC Line Insertion;  Surgeon: Katha Cabal, MD;  Location: [facility];  Service: Cardiovascular;  Laterality: N/A;  . Abdominal hysterectomy    . Appendectomy      Past Surgical History  Procedure Laterality Date  . Cardiac valve replacement      mitral valve replacement with mechanical valve  . Pacemaker insertion    . Peripheral vascular catheterization N/A 05/10/2015    Procedure: PICC Line Insertion;  Surgeon: Katha Cabal, MD;  Location: [facility];  Service: Cardiovascular;  Laterality: N/A;  . Abdominal hysterectomy    . Appendectomy      Current Outpatient Rx  Name  Route  Sig  Dispense  Refill  . acetaminophen (TYLENOL) 325 MG tablet   Oral   Take 650 mg by mouth every 4 (four) hours as needed for mild pain or headache.         . brimonidine (ALPHAGAN) 0.15 % ophthalmic solution   Both Eyes   Place 1 drop into both eyes 2 (two) times daily.         . carbidopa-levodopa (SINEMET IR) 25-100 MG per tablet   Oral   Take 1 tablet by mouth 4 (four) times daily.  120 tablet   11   . cholecalciferol (VITAMIN D) 1000 UNITS tablet   Oral   Take 1,000 Units by mouth daily.         . ferrous sulfate 325 (65 FE) MG tablet   Oral   Take 325 mg by mouth daily.          . furosemide (LASIX) 40 MG tablet   Oral   Take 20 mg by mouth daily.         Marland Kitchen glipiZIDE (GLUCOTROL XL) 5 MG 24 hr tablet   Oral   Take 5 mg by mouth daily with breakfast.         . hydrALAZINE (APRESOLINE) 50 MG tablet   Oral   Take 50 mg by mouth 2 (two) times daily.         . isosorbide dinitrate (ISORDIL) 30 MG tablet   Oral   Take 30 mg by mouth 2 (two) times daily.          Marland Kitchen latanoprost (XALATAN) 0.005 % ophthalmic solution   Both Eyes   Place 1 drop into both eyes at bedtime.         Marland Kitchen levothyroxine (SYNTHROID, LEVOTHROID) 112 MCG tablet   Oral   Take 1 tablet (112 mcg total) by mouth daily before breakfast.   30 tablet   11   . losartan (COZAAR) 50 MG tablet   Oral   Take 1 tablet (50 mg total) by mouth daily.   30 tablet   11   . omeprazole (PRILOSEC) 40 MG capsule   Oral   Take 40 mg by mouth 2 (two) times daily.         . posaconazole (NOXAFIL) 100 MG TBEC delayed-release tablet   Oral   Take 300 mg by mouth daily. Reported on 07/02/2015         . potassium chloride (K-DUR) 10 MEQ tablet   Oral   Take 10 mEq by mouth 2 (two) times daily.         . potassium chloride SA (K-DUR,KLOR-CON) 20 MEQ tablet   Oral   Take 1 tablet (20 mEq total) by mouth 2 (two) times daily. Patient not taking: Reported on 07/02/2015   180 tablet   0   . prochlorperazine (COMPAZINE) 10 MG tablet   Oral   Take 1 tablet (10 mg total) by mouth every 6 (six) hours as needed (Nausea or vomiting). Patient not taking: Reported on 07/02/2015   30 tablet   1   . simvastatin (ZOCOR) 20 MG tablet      TAKE 1 TABLET AT BEDTIME         . sotalol (BETAPACE) 80 MG tablet   Oral   Take 40 mg by mouth.         . valACYclovir (VALTREX) 500 MG tablet   Oral   Take 500 mg by mouth daily. Reported on 07/02/2015         . vitamin B-12 (CYANOCOBALAMIN) 250 MCG tablet   Oral   Take 250 mcg by mouth daily.           Allergies:  Hydrochlorothiazide and Lisinopril  Family History: Family History  Problem Relation Age of Onset  . CAD Other   . Heart disease Father     Social History: Social History  Substance Use Topics  . Smoking status: Former Smoker -- 1.00 packs/day for 10 years    Types: Cigarettes  . Smokeless tobacco: Not on file  Comment: quit about 5 years ago  . Alcohol Use: No     Review of Systems:   10 point review of  systems was performed and was otherwise negative:  Constitutional: No fever Eyes: No visual disturbances ENT: No sore throat, ear pain Cardiac: No chest pain Respiratory: No shortness of breath, wheezing, or stridor Abdomen: No abdominal pain, no vomiting, No diarrhea Endocrine: No weight loss, No night sweats Extremities: No peripheral edema, cyanosis Skin: No rashes, easy bruising Neurologic: No focal weakness, trouble with speech or swollowing Urologic: No dysuria, Hematuria, or urinary frequency  Physical Exam:  ED Triage Vitals  Enc Vitals Group     BP 07/06/15 1305 185/74 mmHg     Pulse Rate 07/06/15 1305 59     Resp 07/06/15 1305 18     Temp 07/06/15 1305 97.7 F (36.5 C)     Temp Source 07/06/15 1305 Oral     SpO2 07/06/15 1305 97 %     Weight 07/06/15 1305 117 lb (53.071 kg)     Height 07/06/15 1305 5\' 3"  (1.6 m)     Head Cir --      Peak Flow --      Pain Score 07/06/15 1308 7     Pain Loc --      Pain Edu? --      Excl. in [place]? --     General: Awake , Alert , and Oriented times 3; GCS 15 Head: Normal cephalic , atraumatic Eyes: Pupils equal , round, reactive to light Nose/Throat: No nasal drainage, patent upper airway without erythema or exudate.  Neck: Supple, Full range of motion, No anterior adenopathy or palpable thyroid masses Lungs: Clear to ascultation without wheezes , rhonchi, or rales Heart: Regular rate, regular rhythm without murmurs , gallops , or rubs Abdomen: Soft, non tender without rebound, guarding , or rigidity; bowel sounds positive and symmetric in all 4 quadrants. No organomegaly .        Extremities: Examination of the lower extremities show swelling primarily in the right lower extremity with some erythema over the medial surface which is been marked off with a pen. There is no proximal groin adenopathy or posterior tibial nodes palpated. Patient's extremities otherwise neurovascularly intact. Redness is warm to the touch and  nonblanching. Neurologic: normal ambulation, Motor symmetric without deficits, sensory intact Skin: warm, dry, no rashes   Labs:   All laboratory work was reviewed including any pertinent negatives or positives listed below:  Labs Reviewed  COMPREHENSIVE METABOLIC PANEL - Abnormal; Notable for the following:    Glucose, Bld 163 (*)    Albumin 3.2 (*)    ALT 8 (*)    GFR calc non Af Amer 59 (*)    All other components within normal limits  CBC - Abnormal; Notable for the following:    WBC 0.4 (*)    RBC 2.86 (*)    Hemoglobin 9.0 (*)    HCT 26.9 (*)    RDW 16.5 (*)    Platelets 23 (*)    All other components within normal limits  URINALYSIS COMPLETEWITH MICROSCOPIC ([facility] ONLY) - Abnormal; Notable for the following:    Color, Urine STRAW (*)    APPearance CLEAR (*)    Squamous Epithelial / LPF 0-5 (*)    All other components within normal limits  CULTURE, BLOOD (ROUTINE X 2)  CULTURE, BLOOD (ROUTINE X 2)  URINE CULTURE   review of laboratory work shows hemoglobin appears to be stable  and all other laboratory work seems to be at baseline for the patient. Blood cultures 2 are pending   Radiology:     EXAM: RIGHT LOWER EXTREMITY VENOUS DOPPLER ULTRASOUND  TECHNIQUE: Gray-scale sonography with graded compression, as well as color Doppler and duplex ultrasound were performed to evaluate the lower extremity deep venous systems from the level of the common femoral vein and including the common femoral, femoral, profunda femoral, popliteal and calf veins including the posterior tibial, peroneal and gastrocnemius veins when visible. The superficial great saphenous vein was also interrogated. Spectral Doppler was utilized to evaluate flow at rest and with distal augmentation maneuvers in the common femoral, femoral and popliteal veins.  COMPARISON: None.  FINDINGS: Contralateral Common Femoral Vein: Respiratory phasicity is normal and symmetric with the symptomatic  side. No evidence of thrombus. Normal compressibility.  Common Femoral Vein: No evidence of thrombus. Normal compressibility, respiratory phasicity and response to augmentation.  Saphenofemoral Junction: No evidence of thrombus. Normal compressibility and flow on color Doppler imaging.  Profunda Femoral Vein: No evidence of thrombus. Normal compressibility and flow on color Doppler imaging.  Femoral Vein: No evidence of thrombus. Normal compressibility, respiratory phasicity and response to augmentation.  Popliteal Vein: No evidence of thrombus. Normal compressibility, respiratory phasicity and response to augmentation.  Calf Veins: No evidence of thrombus. Normal compressibility and flow on color Doppler imaging.  Superficial Great Saphenous Vein: No evidence of thrombus. Normal compressibility and flow on color Doppler imaging.  Venous Reflux: None.  Other Findings: None.  IMPRESSION: No evidence of deep venous thrombosis. I personally reviewed the radiologic studies     ED Course: * Patient's stay was uneventful. After blood cultures 2 obtained the patient was started on IV vancomycin with her history of being immunocompromise state of felt she required further inpatient monitoring. I felt was unlikely that she was septic and again she remained hemodynamically stable here in emergency department.       Final Clinical Impression: Right lower extremity cellulitis Final diagnoses:  None     Plan:  Inpatient management Patient's case was reviewed with the hospitalist team, further disposition and management depends upon their evaluation.           Daymon Larsen, MD 07/06/15 [phone]

## 2015-07-07 DIAGNOSIS — R5381 Other malaise: Secondary | ICD-10-CM

## 2015-07-07 DIAGNOSIS — F17210 Nicotine dependence, cigarettes, uncomplicated: Secondary | ICD-10-CM

## 2015-07-07 DIAGNOSIS — D61818 Other pancytopenia: Secondary | ICD-10-CM

## 2015-07-07 DIAGNOSIS — L03115 Cellulitis of right lower limb: Principal | ICD-10-CM

## 2015-07-07 DIAGNOSIS — C9200 Acute myeloblastic leukemia, not having achieved remission: Secondary | ICD-10-CM

## 2015-07-07 DIAGNOSIS — R531 Weakness: Secondary | ICD-10-CM

## 2015-07-07 DIAGNOSIS — G620 Drug-induced polyneuropathy: Secondary | ICD-10-CM

## 2015-07-07 DIAGNOSIS — I10 Essential (primary) hypertension: Secondary | ICD-10-CM

## 2015-07-07 DIAGNOSIS — E039 Hypothyroidism, unspecified: Secondary | ICD-10-CM

## 2015-07-07 DIAGNOSIS — R5383 Other fatigue: Secondary | ICD-10-CM

## 2015-07-07 DIAGNOSIS — E119 Type 2 diabetes mellitus without complications: Secondary | ICD-10-CM

## 2015-07-07 DIAGNOSIS — I2510 Atherosclerotic heart disease of native coronary artery without angina pectoris: Secondary | ICD-10-CM

## 2015-07-07 LAB — CBC
HCT: 24 % — ABNORMAL LOW (ref 35.0–47.0)
Hemoglobin: 8.1 g/dL — ABNORMAL LOW (ref 12.0–16.0)
MCH: 30.7 pg (ref 26.0–34.0)
MCHC: 33.6 g/dL (ref 32.0–36.0)
MCV: 91.5 fL (ref 80.0–100.0)
PLATELETS: 20 10*3/uL — AB (ref 150–440)
RBC: 2.63 MIL/uL — ABNORMAL LOW (ref 3.80–5.20)
RDW: 16.8 % — AB (ref 11.5–14.5)
WBC: 0.4 10*3/uL — AB (ref 3.6–11.0)

## 2015-07-07 LAB — BASIC METABOLIC PANEL
Anion gap: 2 — ABNORMAL LOW (ref 5–15)
BUN: 18 mg/dL (ref 6–20)
CALCIUM: 8.7 mg/dL — AB (ref 8.9–10.3)
CO2: 27 mmol/L (ref 22–32)
CREATININE: 1 mg/dL (ref 0.44–1.00)
Chloride: 111 mmol/L (ref 101–111)
GFR calc Af Amer: >60 mL/min (ref >60)
GFR, EST NON AFRICAN AMERICAN: 53 mL/min — AB (ref >60)
GLUCOSE: 72 mg/dL (ref 65–99)
POTASSIUM: 4.1 mmol/L (ref 3.5–5.1)
SODIUM: 140 mmol/L (ref 135–145)

## 2015-07-07 LAB — URINE CULTURE

## 2015-07-07 NOTE — Progress Notes (Signed)
[facility] [place] NAME: [name]    MR#:  [id number]  DATE OF BIRTH:  March 23, 1936  SUBJECTIVE:  CHIEF COMPLAINT:   Chief Complaint  Patient presents with  . Cellulitis   Redness and swelling RLE improving REVIEW OF SYSTEMS:    Review of Systems  Constitutional: Positive for malaise/fatigue. Negative for fever and chills.  HENT: Negative for sore throat.   Eyes: Negative for blurred vision, double vision and pain.  Respiratory: Negative for cough, hemoptysis, shortness of breath and wheezing.   Cardiovascular: Negative for chest pain, palpitations, orthopnea and leg swelling.  Gastrointestinal: Negative for heartburn, nausea, vomiting, abdominal pain, diarrhea and constipation.  Genitourinary: Negative for dysuria and hematuria.  Musculoskeletal: Negative for back pain and joint pain.  Skin: Positive for rash.  Neurological: Negative for sensory change, speech change, focal weakness and headaches.  Endo/Heme/Allergies: Does not bruise/bleed easily.  Psychiatric/Behavioral: Negative for depression. The patient is not nervous/anxious.       DRUG ALLERGIES:   Allergies  Allergen Reactions  . Hydrochlorothiazide Other (See Comments)    Reaction:  Worsening hypercalcaemia  . Lisinopril Other (See Comments) and Cough    Reaction:  Worsening renal insufficiency at 40mg  dose.       VITALS:  Blood pressure 145/51, pulse 59, temperature 98.3 F (36.8 C), temperature source Oral, resp. rate 20, height 5\' 3"  (1.6 m), weight 53.071 kg (117 lb), SpO2 96 %.  PHYSICAL EXAMINATION:   Physical Exam  GENERAL:  80 y.o.-year-old patient lying in the bed with no acute distress.  EYES: Pupils equal, round, reactive to light and accommodation. No scleral icterus. Extraocular muscles intact.  HEENT: Head atraumatic, normocephalic. Oropharynx and nasopharynx clear.  NECK:  Supple, no jugular venous distention. No thyroid  enlargement, no tenderness.  LUNGS: Normal breath sounds bilaterally, no wheezing, rales, rhonchi. No use of accessory muscles of respiration.  CARDIOVASCULAR: S1, S2 normal. No murmurs, rubs, or gallops.  ABDOMEN: Soft, nontender, nondistended. Bowel sounds present. No organomegaly or mass.  EXTREMITIES: No cyanosis, clubbing or edema b/l.    NEUROLOGIC: Cranial nerves II through XII are intact. No focal Motor or sensory deficits b/l.   PSYCHIATRIC: The patient is alert and awake SKIN: Redness and warmth RLE   LABORATORY PANEL:   CBC  Recent Labs Lab 07/07/15 0619  WBC 0.4*  HGB 8.1*  HCT 24.0*  PLT 20*   ------------------------------------------------------------------------------------------------------------------  Chemistries   Recent Labs Lab 07/06/15 1316 07/07/15 0619  NA 139 140  K 4.3 4.1  CL 108 111  CO2 26 27  GLUCOSE 163* 72  BUN 17 18  CREATININE 0.92 1.00  CALCIUM 9.2 8.7*  AST 15  --   ALT 8*  --   ALKPHOS 98  --   BILITOT 1.1  --    ------------------------------------------------------------------------------------------------------------------  Cardiac Enzymes No results for input(s): TROPONINI in the last 168 hours. ------------------------------------------------------------------------------------------------------------------  RADIOLOGY:  US Venous Img Lower Unilateral Right  07/06/2015  CLINICAL DATA:  Right leg swelling EXAM: RIGHT LOWER EXTREMITY VENOUS DOPPLER ULTRASOUND TECHNIQUE: Gray-scale sonography with graded compression, as well as color Doppler and duplex ultrasound were performed to evaluate the lower extremity deep venous systems from the level of the common femoral vein and including the common femoral, femoral, profunda femoral, popliteal and calf veins including the posterior tibial, peroneal and gastrocnemius veins when visible. The superficial great saphenous vein was also interrogated. Spectral Doppler was utilized to  evaluate flow at  rest and with distal augmentation maneuvers in the common femoral, femoral and popliteal veins. COMPARISON:  None. FINDINGS: Contralateral Common Femoral Vein: Respiratory phasicity is normal and symmetric with the symptomatic side. No evidence of thrombus. Normal compressibility. Common Femoral Vein: No evidence of thrombus. Normal compressibility, respiratory phasicity and response to augmentation. Saphenofemoral Junction: No evidence of thrombus. Normal compressibility and flow on color Doppler imaging. Profunda Femoral Vein: No evidence of thrombus. Normal compressibility and flow on color Doppler imaging. Femoral Vein: No evidence of thrombus. Normal compressibility, respiratory phasicity and response to augmentation. Popliteal Vein: No evidence of thrombus. Normal compressibility, respiratory phasicity and response to augmentation. Calf Veins: No evidence of thrombus. Normal compressibility and flow on color Doppler imaging. Superficial Great Saphenous Vein: No evidence of thrombus. Normal compressibility and flow on color Doppler imaging. Venous Reflux:  None. Other Findings:  None. IMPRESSION: No evidence of deep venous thrombosis. Electronically Signed   By: Rolm Baptise M.D.   On: 07/06/2015 15:04     ASSESSMENT AND PLAN:   [name] is a 80 y.o. female with a known history of AML status post 4 rounds of chemotherapy and currently refusing chemotherapy due to side effects, history of mechanical mitral valve surgery due to history of rheumatic heart disease, chronic leukopenia and thrombocytopenia and anemia and recent admission last week for transfusion, diabetes and hypertension presents to the hospital secondary to right leg swelling and redness.  #1 Right lower extremity cellulitis- dopplers with no evidence of DVT - On vacomycin and unasyn. - keep the leg elevated. - poor IV access, so continue PICC line.  #2 Pancytopenia- stable, wbc at 0.4 and stable since the last  3 weeks, platelets stable without bleeding - hold transfusion for now - oncology consulted and discussed with Dr. Grayland Ormond  #3 diabetes mellitus- SSI, hold glipizide until oral intake is improved.  #4 Atrial fibrillation-continue sotalol. Rate is controlled. Not on any anticoagulation due to anemia and thrombocytopenia  #5 Mitral valve replacement- She was on Coumadin up until her AML was diagnosed and she started getting anemic and requiring transfusions. She was on Lovenox up until recently and now Lovenox has been discontinued due to anemia and thrombocytopenia  #6 hypertension-continue home medications.  #7 Parkinson's disease- cotninue Sinemet  #8 DVT prophylaxis-Ted's and SCDs. With her anemia and thrombocytopenia, we'll not start any heparin products.   All the records are reviewed and case discussed with Care Management/Social Workerr. Management plans discussed with the patient, family and they are in agreement.  CODE STATUS: DNR  DVT Prophylaxis: SCDs  TOTAL TIME TAKING CARE OF THIS PATIENT: 30 minutes.   POSSIBLE D/C IN 1-2 DAYS, DEPENDING ON CLINICAL CONDITION.   Hillary Bow R M.D on 07/07/2015 at 2:03 PM  Between 7am to 6pm - Pager - [phone]  After 6pm go to [email or web address] - password [email or web address] [facility]  Office  [phone]  CC: Primary care physician; Adin Hector, MD    Note: This dictation was prepared with Dragon dictation along with smaller phrase technology. Any transcriptional errors that result from this process are unintentional.

## 2015-07-07 NOTE — Consult Note (Signed)
[facility]  [phone] [phone]  ID: LAMYAH CREED OB: [birth year]  MR#: [id number]  VDI#:[id number]  Patient Care Team: Adin Hector, MD as PCP - General (Internal Medicine)  CHIEF COMPLAINT:  Chief Complaint  Patient presents with  . Cellulitis    INTERVAL HISTORY: Patient is a 80 year old female with AML who was recently discharged from the hospital after receiving 4 units of packed red blood cells for progressive symptomatic anemia. She is readmitted for worsening right lower leg cellulitis. She otherwise feels well. She denies any fevers. She has no neurologic complaints. She has a fair appetite. She denies any chest pain or shortness of breath. She has no nausea, vomiting, constipation, or diarrhea. She denies any easy bleeding or bruising. Patient otherwise feels well and offers no further specific complaints.  REVIEW OF SYSTEMS:   Review of Systems  Constitutional: Positive for malaise/fatigue. Negative for fever.  Respiratory: Negative.  Negative for shortness of breath.   Cardiovascular: Negative.   Gastrointestinal: Negative for blood in stool and melena.  Skin:       Erythema and warmth on right lower extremity.  Neurological: Positive for weakness.    As per HPI. Otherwise, a complete review of systems is negatve.  PAST MEDICAL HISTORY: Past Medical History  Diagnosis Date  . Atrial fibrillation ([place])   . Parkinson's disease ([place])   . Glaucoma   . Hypertension   . Hypothyroidism   . GERD (gastroesophageal reflux disease)   . High cholesterol   . Diabetes mellitus without complication ([place])   . CAD (coronary artery disease) of bypass graft   . History of appendectomy   . H/O: hysterectomy   . Leukemia ([place])     AML diagnosed 2016- refused treatment  . Rheumatic heart disease     s/p mitral stenosis  . Chronic leukopenia   . Thrombocytopenia ([place])   . Anemia     PAST SURGICAL HISTORY: Past Surgical  History  Procedure Laterality Date  . Cardiac valve replacement      mitral valve replacement with mechanical valve  . Pacemaker insertion    . Peripheral vascular catheterization N/A 05/10/2015    Procedure: PICC Line Insertion;  Surgeon: Katha Cabal, MD;  Location: [facility];  Service: Cardiovascular;  Laterality: N/A;  . Abdominal hysterectomy    . Appendectomy      FAMILY HISTORY Family History  Problem Relation Age of Onset  . CAD Other   . Heart disease Father        ADVANCED DIRECTIVES:    HEALTH MAINTENANCE: Social History  Substance Use Topics  . Smoking status: Former Smoker -- 1.00 packs/day for 10 years    Types: Cigarettes  . Smokeless tobacco: None     Comment: quit about 5 years ago  . Alcohol Use: No     Colonoscopy:  PAP:  Bone density:  Lipid panel:  Allergies  Allergen Reactions  . Hydrochlorothiazide Other (See Comments)    Reaction:  Worsening hypercalcaemia  . Lisinopril Other (See Comments) and Cough    Reaction:  Worsening renal insufficiency at 51m dose.       Current Facility-Administered Medications  Medication Dose Route Frequency Provider Last Rate Last Dose  . 0.9 %  sodium chloride infusion   Intravenous Continuous RGladstone Lighter MD 50 mL/hr at 07/06/15 2337    . acetaminophen (TYLENOL) tablet 650 mg  650 mg Oral Q6H PRN RGladstone Lighter MD  Or  . acetaminophen (TYLENOL) suppository 650 mg  650 mg Rectal Q6H PRN Gladstone Lighter, MD      . ampicillin-sulbactam (UNASYN) 1.5 g in sodium chloride 0.9 % 50 mL IVPB  1.5 g Intravenous Q6H Melissa D Maccia, RPH   1.5 g at 07/07/15 1245  . brimonidine (ALPHAGAN) 0.15 % ophthalmic solution 1 drop  1 drop Both Eyes BID Gladstone Lighter, MD   1 drop at 07/07/15 1016  . carbidopa-levodopa (SINEMET IR) 25-100 MG per tablet immediate release 1 tablet  1 tablet Oral QID Gladstone Lighter, MD   1 tablet at 07/07/15 1012  . cholecalciferol (VITAMIN D) tablet 1,000  Units  1,000 Units Oral Daily Gladstone Lighter, MD   1,000 Units at 07/07/15 1012  . cyanocobalamin tablet 250 mcg  250 mcg Oral Daily Gladstone Lighter, MD   250 mcg at 07/07/15 1011  . docusate sodium (COLACE) capsule 100 mg  100 mg Oral BID Gladstone Lighter, MD   100 mg at 07/07/15 1012  . ferrous sulfate tablet 325 mg  325 mg Oral Daily Gladstone Lighter, MD   325 mg at 07/07/15 1012  . furosemide (LASIX) tablet 20 mg  20 mg Oral Daily Gladstone Lighter, MD   20 mg at 07/07/15 1012  . hydrALAZINE (APRESOLINE) injection 10 mg  10 mg Intravenous Q4H PRN Lytle Butte, MD   10 mg at 07/06/15 1900  . hydrALAZINE (APRESOLINE) tablet 50 mg  50 mg Oral BID Gladstone Lighter, MD   50 mg at 07/07/15 1012  . isosorbide dinitrate (ISORDIL) tablet 30 mg  30 mg Oral BID Gladstone Lighter, MD   30 mg at 07/07/15 1012  . latanoprost (XALATAN) 0.005 % ophthalmic solution 1 drop  1 drop Both Eyes QHS Gladstone Lighter, MD   1 drop at 07/06/15 2328  . levothyroxine (SYNTHROID, LEVOTHROID) tablet 112 mcg  112 mcg Oral QAC breakfast Gladstone Lighter, MD   112 mcg at 07/07/15 1012  . losartan (COZAAR) tablet 50 mg  50 mg Oral Daily Gladstone Lighter, MD   50 mg at 07/07/15 1012  . ondansetron (ZOFRAN) tablet 4 mg  4 mg Oral Q6H PRN Gladstone Lighter, MD       Or  . ondansetron (ZOFRAN) injection 4 mg  4 mg Intravenous Q6H PRN Gladstone Lighter, MD      . pantoprazole (PROTONIX) EC tablet 40 mg  40 mg Oral BID Gladstone Lighter, MD   40 mg at 07/07/15 1011  . posaconazole (NOXAFIL) delayed-release tablet 300 mg  300 mg Oral Daily Gladstone Lighter, MD   300 mg at 07/07/15 1016  . potassium chloride (K-DUR,KLOR-CON) CR tablet 10 mEq  10 mEq Oral BID Gladstone Lighter, MD   10 mEq at 07/07/15 1012  . simvastatin (ZOCOR) tablet 20 mg  20 mg Oral QHS Gladstone Lighter, MD   20 mg at 07/06/15 2329  . sotalol (BETAPACE) tablet 40 mg  40 mg Oral Daily Gladstone Lighter, MD   40 mg at 07/07/15 1012  . valACYclovir  (VALTREX) tablet 500 mg  500 mg Oral Daily Gladstone Lighter, MD   500 mg at 07/07/15 1011  . vancomycin (VANCOCIN) IVPB 750 mg/150 ml premix  750 mg Intravenous Q24H Daymon Larsen, MD       Facility-Administered Medications Ordered in Other Encounters  Medication Dose Route Frequency Provider Last Rate Last Dose  . 0.9 %  sodium chloride infusion   Intravenous Continuous Lloyd Huger, MD 999 mL/hr at 10/29/14 1550    .  heparin flush 10 UNIT/ML injection 10 Units  10 Units Intravenous Once Lloyd Huger, MD      . sodium chloride 0.9 % injection 10 mL  10 mL Intracatheter PRN Lloyd Huger, MD   10 mL at 06/04/15 1609  . sodium chloride 0.9 % injection 10 mL  10 mL Intracatheter PRN Lloyd Huger, MD        OBJECTIVE: Filed Vitals:   07/07/15 0520 07/07/15 1010  BP: 123/41 145/51  Pulse: 59 59  Temp: 98.3 F (36.8 C)   Resp: 20      Body mass index is 20.73 kg/(m^2).    ECOG FS:3 - Symptomatic, >50% confined to bed  General: thin, no acute distress. Eyes: Pink conjunctiva, anicteric sclera. HEENT: Normocephalic, moist mucous membranes, clear oropharnyx. Lungs: Clear to auscultation bilaterally. Heart: Regular rate and rhythm. No rubs, murmurs, or gallops. Abdomen: Soft, nontender, nondistended. No organomegaly noted, normoactive bowel sounds. Musculoskeletal: No edema, cyanosis, or clubbing. Neuro: Alert, answering all questions appropriately. Cranial nerves grossly intact. Skin: Areas of erythema and warmth on right lower extremity.  Psych: Normal affect. Lymphatics: No cervical, calvicular, axillary or inguinal LAD.   LAB RESULTS:  Lab Results  Component Value Date   NA 140 07/07/2015   K 4.1 07/07/2015   CL 111 07/07/2015   CO2 27 07/07/2015   GLUCOSE 72 07/07/2015   BUN 18 07/07/2015   CREATININE 1.00 07/07/2015   CALCIUM 8.7* 07/07/2015   PROT 6.7 07/06/2015   ALBUMIN 3.2* 07/06/2015   AST 15 07/06/2015   ALT 8* 07/06/2015   ALKPHOS 98  07/06/2015   BILITOT 1.1 07/06/2015   GFRNONAA 53* 07/07/2015   GFRAA >60 07/07/2015    Lab Results  Component Value Date   WBC 0.4* 07/07/2015   NEUTROABS 0.1* 07/04/2015   HGB 8.1* 07/07/2015   HCT 24.0* 07/07/2015   MCV 91.5 07/07/2015   PLT 20* 07/07/2015     STUDIES: US Venous Img Lower Unilateral Right  07/06/2015  CLINICAL DATA:  Right leg swelling EXAM: RIGHT LOWER EXTREMITY VENOUS DOPPLER ULTRASOUND TECHNIQUE: Gray-scale sonography with graded compression, as well as color Doppler and duplex ultrasound were performed to evaluate the lower extremity deep venous systems from the level of the common femoral vein and including the common femoral, femoral, profunda femoral, popliteal and calf veins including the posterior tibial, peroneal and gastrocnemius veins when visible. The superficial great saphenous vein was also interrogated. Spectral Doppler was utilized to evaluate flow at rest and with distal augmentation maneuvers in the common femoral, femoral and popliteal veins. COMPARISON:  None. FINDINGS: Contralateral Common Femoral Vein: Respiratory phasicity is normal and symmetric with the symptomatic side. No evidence of thrombus. Normal compressibility. Common Femoral Vein: No evidence of thrombus. Normal compressibility, respiratory phasicity and response to augmentation. Saphenofemoral Junction: No evidence of thrombus. Normal compressibility and flow on color Doppler imaging. Profunda Femoral Vein: No evidence of thrombus. Normal compressibility and flow on color Doppler imaging. Femoral Vein: No evidence of thrombus. Normal compressibility, respiratory phasicity and response to augmentation. Popliteal Vein: No evidence of thrombus. Normal compressibility, respiratory phasicity and response to augmentation. Calf Veins: No evidence of thrombus. Normal compressibility and flow on color Doppler imaging. Superficial Great Saphenous Vein: No evidence of thrombus. Normal compressibility  and flow on color Doppler imaging. Venous Reflux:  None. Other Findings:  None. IMPRESSION: No evidence of deep venous thrombosis. Electronically Signed   By: Rolm Baptise M.D.   On: 07/06/2015 15:04  ASSESSMENT: AML, pancytopenia, cellulitis.  PLAN:    1. AML: Given her worsening pancytopenia, there is suspicion of progression of disease. Patient continues to decline additional treatment at this time. She is not ready to enroll in hospice either. No intervention or treatment is planned at this time. Of note, her most recent bone marrow biopsy was improved, but still contained AML at approximately 19% of her sample. Patient does not wish to have another bone marrow biopsy at this time. She completed 4 cycles of azacitidine 75 mg/m IV in June 2016. If she reinitiates treatment, she will receive this treatment on days 1 through 5 as well as on day 8 and 9.  2. Thrombocytopenia: Platelet count is decreased, but stable. Likely secondary to progressive disease. Continue to hold Lovenox. 3. Anemia: Significantly improved after 4 units of packed red blood cells earlier this week, but now trending down. Repeat CBC in the morning and consider additional unit of red cells if necessary.  4. Neutropenia: Patient's white blood cell count and neutrophil count is decreased, but relatively unchanged.  Likely secondary to progressive disease, monitor. 5. Poor vascular access: PICC line in place with home health as needed. 6. Cellulitis: Continue IV vancomycin given patient's neutropenia. Patient remains afebrile.  Appreciate consult, will follow.   Lloyd Huger, MD   07/07/2015 1:30 PM

## 2015-07-08 LAB — CBC WITH DIFFERENTIAL/PLATELET
BASOS ABS: 0 10*3/uL (ref 0–0.1)
Basophils Relative: 0 %
EOS ABS: 0 10*3/uL (ref 0–0.7)
Eosinophils Relative: 2 %
HCT: 23.2 % — ABNORMAL LOW (ref 35.0–47.0)
Hemoglobin: 7.7 g/dL — ABNORMAL LOW (ref 12.0–16.0)
LYMPHS ABS: 0.4 10*3/uL — AB (ref 1.0–3.6)
Lymphocytes Relative: 86 %
MCH: 30.9 pg (ref 26.0–34.0)
MCHC: 33.3 g/dL (ref 32.0–36.0)
MCV: 92.8 fL (ref 80.0–100.0)
MONO ABS: 0 10*3/uL — AB (ref 0.2–0.9)
MONOS PCT: 3 %
Neutro Abs: 0 10*3/uL — ABNORMAL LOW (ref 1.4–6.5)
Neutrophils Relative %: 9 %
PLATELETS: 18 10*3/uL — AB (ref 150–440)
RBC: 2.5 MIL/uL — AB (ref 3.80–5.20)
RDW: 16.3 % — AB (ref 11.5–14.5)
WBC: 0.4 10*3/uL — AB (ref 3.6–11.0)

## 2015-07-08 MED ORDER — AMOXICILLIN-POT CLAVULANATE 875-125 MG PO TABS: 1.0000 | ORAL_TABLET | Freq: Two times a day (BID) | ORAL | Status: AC

## 2015-07-08 NOTE — Progress Notes (Signed)
Discussed discharge instruction and medications with pt and her husband. Made aware of follow up appointment. Pt's PICC line left in place.  Pt transported home via car by husband.  Clarise Cruz, RN

## 2015-07-08 NOTE — Care Management (Signed)
Admitted to [facility] with the diagnosis of cellulitis. Lives with husband, [name] [phone]). Last seen [name] 2 weeks ago. [facility], [employer], and [employer] ([facility] in 2016)  in the past. Lajean Manes comes every week for PICC line dressing changes. [facility] in the past. Rolling walker, cane, and wheelchair in the home. No falls. Appetite is better. Takes care of all basic activities of daily living herself. Husband will transport.  Physical therapy evaluation pending.  Shelbie Ammons RN MSN CCM Care Management [phone]

## 2015-07-08 NOTE — Care Management Important Message (Signed)
Important Message  Patient Details  Name: [name] MRN: [id number] Date of Birth: [birth year]   Medicare Important Message Given:  Yes    Shelbie Ammons, RN 07/08/2015, 2:24 PM

## 2015-07-08 NOTE — Evaluation (Signed)
Physical Therapy Evaluation Patient Details Name: [name] MRN: [id number] DOB: 1935/09/24 Today's Date: 07/08/2015   History of Present Illness  Patient is a 80 y/o female that presents with RLE cellulitis. She has a past medical history of AML and at baseline has decreased Hgb, platelets. per her husband she has been gaining weight at home recently, which they are encouraged about.   Clinical Impression  This patient is familiar to the author from previous admissions. She appears today to be at her baseline, which is generalized weakness secondary to Parkinson's and AML diagnoses. She has not been receiving home health PT, however this may be appropriate given her fatigue in ambulation session noted today. Her bed mobility and transfers are prolonged in time, however no balance concerns noted in this session. She would benefit from skilled PT services to maintain her functional mobility given the above diagnoses.     Follow Up Recommendations Home health PT (For maintenance)    Equipment Recommendations       Recommendations for Other Services       Precautions / Restrictions Precautions Precautions: Fall Restrictions Weight Bearing Restrictions: No      Mobility  Bed Mobility Overal bed mobility: Modified Independent             General bed mobility comments: Mild deficits in speed, though no loss of balance noted. She does use hand rails on supine to sit transfer appropriately.   Transfers Overall transfer level: Needs assistance Equipment used: Rolling walker (2 wheeled) Transfers: Sit to/from Stand Sit to Stand: Supervision            Ambulation/Gait Ambulation/Gait assistance: Supervision Ambulation Distance (Feet): 130 Feet Assistive device: Rolling walker (2 wheeled) Gait Pattern/deviations: Decreased step length - right;Decreased step length - left;Narrow base of support   Gait velocity interpretation: Below normal speed for age/gender     Stairs            Wheelchair Mobility    Modified Rankin (Stroke Patients Only)       Balance Overall balance assessment: Needs assistance   Sitting balance-Leahy Scale: Good     Standing balance support: Bilateral upper extremity supported Standing balance-Leahy Scale: Fair Standing balance comment: Difficult to complete modified DGI as she did not turn head appropriately. No overt loss of balance noted throughout session, though significantly prolonged time to complete turn noted.                              Pertinent Vitals/Pain Pain Assessment:  (Patient does not report or become limited by any pain in this session.)    Home Living Family/patient expects to be discharged to:: Private residence Living Arrangements: Spouse/significant other Available Help at Discharge: Family   Home Access: [place]: One [place]: Environmental consultant - 2 wheels;Cane - single point;Bedside commode;Shower seat      Prior Function Level of Independence: Independent with assistive device(s)         Comments: Patient reports no falls with RW recently.      Hand Dominance        Extremity/Trunk Assessment   Upper Extremity Assessment: Generalized weakness           Lower Extremity Assessment: Overall WFL for tasks assessed         Communication   Communication: No difficulties;Expressive difficulties (Soft spoken)  Cognition Arousal/Alertness: Awake/alert Behavior During Therapy: WFL for  tasks assessed/performed Overall Cognitive Status: History of cognitive impairments - at baseline                      General Comments      Exercises        Assessment/Plan    PT Assessment Patient needs continued PT services  PT Diagnosis Difficulty walking;Generalized weakness   PT Problem List Decreased strength;Decreased activity tolerance;Decreased balance;Decreased mobility  PT Treatment Interventions DME  instruction;Gait training;Stair training;Therapeutic activities;Therapeutic exercise;Balance training   PT Goals (Current goals can be found in the Care Plan section) Acute Rehab PT Goals Patient Stated Goal: To return home  PT Goal Formulation: With family Time For Goal Achievement: 07/22/15 Potential to Achieve Goals: Good    Frequency Min 2X/week   Barriers to discharge        Co-evaluation               End of Session Equipment Utilized During Treatment: Gait belt Activity Tolerance: Patient tolerated treatment well Patient left: with call bell/phone within reach;in bed;with bed alarm set Nurse Communication: Mobility status         Time: [id number] PT Time Calculation (min) (ACUTE ONLY): 11 min   Charges:   PT Evaluation $PT Eval Low Complexity: 1 Procedure     PT G Codes:       Kerman Passey, PT, DPT    07/08/2015, 4:00 PM

## 2015-07-08 NOTE — Discharge Instructions (Signed)

## 2015-07-10 NOTE — Discharge Summary (Signed)
[facility] NAME: [name]    MR#:  [id number]  DATE OF BIRTH:  [birth year]  DATE OF ADMISSION:  07/06/2015 ADMITTING PHYSICIAN: Gladstone Lighter, MD  DATE OF DISCHARGE: 07/08/2015  4:12 PM  PRIMARY CARE PHYSICIAN: Tama High III, MD    ADMISSION DIAGNOSIS:  Cellulitis of right lower extremity [L03.115]  DISCHARGE DIAGNOSIS:  Active Problems:   Cellulitis   SECONDARY DIAGNOSIS:   Past Medical History  Diagnosis Date  . Atrial fibrillation ([place])   . Parkinson's disease ([place])   . Glaucoma   . Hypertension   . Hypothyroidism   . GERD (gastroesophageal reflux disease)   . High cholesterol   . Diabetes mellitus without complication ([place])   . CAD (coronary artery disease) of bypass graft   . History of appendectomy   . H/O: hysterectomy   . Leukemia ([place])     AML diagnosed 2016- refused treatment  . Rheumatic heart disease     s/p mitral stenosis  . Chronic leukopenia   . Thrombocytopenia ([place])   . Anemia      ADMITTING HISTORY  [name] is a 80 y.o. female with a known history of AML status post 4 rounds of chemotherapy and currently refusing chemotherapy due to side effects, history of mechanical mitral valve surgery due to history of rheumatic heart disease, chronic leukopenia and thrombocytopenia and anemia and recent admission last week for transfusion, diabetes and hypertension presents to the hospital secondary to right leg swelling and redness. Patient was just in the hospital last week for hemoglobin less than 5 and received 2 units transfusion and got discharged with home health services. She has chronic leukopenia with white count less than 1k, platelets in the 20k range. She did not qualify for hospice. Is being followed by home health services. Patient is a poor historian, husband at bedside. The home health nurse came in today to change PICC line dressing and noted that her right leg was  more swollen and red and tender to touch. She was sent in to the hospital. Because her WBC count is still low, she is neutropenic she is being admitted for cellulitis and IV antibiotics. Dopplers have been done to rule out any DVT. Patient says she has both lower extremity swelling going on for a while but the right one has been more swollen lately. She cannot specify for how long she has noticed it to be swollen.   HOSPITAL COURSE:   [name] is a 80 y.o. female with a known history of AML status post 4 rounds of chemotherapy and currently refusing chemotherapy due to side effects, history of mechanical mitral valve surgery due to history of rheumatic heart disease, chronic leukopenia and thrombocytopenia and anemia and recent admission last week for transfusion, diabetes and hypertension presents to the hospital secondary to right leg swelling and redness.  #1 Right lower extremity cellulitis- dopplers with no evidence of DVT - On vacomycin and unasyn. - keep the leg elevated. - poor IV access, so continue PICC line  Change to Augmentin at discharge for 1 week F/U with Dr. Grayland Ormond  #2 Pancytopenia- stable, wbc at 0.4 and stable since the last 3 weeks, platelets stable without bleeding - hold transfusion for now - oncology consulted and discussed with Dr. Grayland Ormond  #3 diabetes mellitus- SSI No change in home meds  #4 Atrial fibrillation-continue sotalol. Rate is controlled. Not on any anticoagulation due to anemia and thrombocytopenia  #  5 Mitral valve replacement- She was on Coumadin up until her AML was diagnosed and she started getting anemic and requiring transfusions. She was on Lovenox up until recently and now Lovenox has been discontinued due to anemia and thrombocytopenia  #6 hypertension-continue home medications.  #7 Parkinson's disease- cotninue Sinemet  #8 DVT prophylaxis-Ted's and SCDs. With her anemia and thrombocytopenia, we'll not start any heparin  products.  Stable for discharge home  CONSULTS OBTAINED:  Treatment Team:  Lloyd Huger, MD  DRUG ALLERGIES:   Allergies  Allergen Reactions  . Hydrochlorothiazide Other (See Comments)    Reaction:  Worsening hypercalcaemia  . Lisinopril Other (See Comments) and Cough    Reaction:  Worsening renal insufficiency at 40mg  dose.       DISCHARGE MEDICATIONS:   Discharge Medication List as of 07/08/2015  1:22 PM    START taking these medications   Details  amoxicillin-clavulanate (AUGMENTIN) 875-125 MG tablet Take 1 tablet by mouth 2 (two) times daily., Starting 07/08/2015, Until Discontinued, Print      CONTINUE these medications which have NOT CHANGED   Details  acetaminophen (TYLENOL) 325 MG tablet Take 650 mg by mouth every 4 (four) hours as needed for mild pain or headache., Until Discontinued, Historical Med    brimonidine (ALPHAGAN) 0.15 % ophthalmic solution Place 1 drop into both eyes 2 (two) times daily., Until Discontinued, Historical Med    carbidopa-levodopa (SINEMET IR) 25-100 MG per tablet Take 1 tablet by mouth 4 (four) times daily., Starting 12/26/2014, Until Discontinued, Normal    cholecalciferol (VITAMIN D) 1000 UNITS tablet Take 1,000 Units by mouth daily., Until Discontinued, Historical Med    ferrous sulfate 325 (65 FE) MG tablet Take 325 mg by mouth daily. , Until Discontinued, Historical Med    furosemide (LASIX) 40 MG tablet Take 20 mg by mouth daily., Until Discontinued, Historical Med    glipiZIDE (GLUCOTROL XL) 5 MG 24 hr tablet Take 5 mg by mouth daily with breakfast., Until Discontinued, Historical Med    hydrALAZINE (APRESOLINE) 50 MG tablet Take 50 mg by mouth 2 (two) times daily., Until Discontinued, Historical Med    isosorbide dinitrate (ISORDIL) 30 MG tablet Take 30 mg by mouth 2 (two) times daily., Until Discontinued, Historical Med    latanoprost (XALATAN) 0.005 % ophthalmic solution Place 1 drop into both eyes at bedtime., Until  Discontinued, Historical Med    levothyroxine (SYNTHROID, LEVOTHROID) 100 MCG tablet Take 100 mcg by mouth daily before breakfast., Until Discontinued, Historical Med    losartan (COZAAR) 100 MG tablet Take 100 mg by mouth daily., Until Discontinued, Historical Med    omeprazole (PRILOSEC) 40 MG capsule Take 40 mg by mouth 2 (two) times daily., Until Discontinued, Historical Med    posaconazole (NOXAFIL) 100 MG TBEC delayed-release tablet Take 300 mg by mouth daily. Reported on 07/02/2015, Until Discontinued, Historical Med    potassium chloride (K-DUR) 10 MEQ tablet Take 10 mEq by mouth 2 (two) times daily., Until Discontinued, Historical Med    simvastatin (ZOCOR) 20 MG tablet TAKE 1 TABLET AT BEDTIME, Historical Med    sotalol (BETAPACE) 80 MG tablet Take 40 mg by mouth 2 (two) times daily. , Starting 01/28/2015, Until Tue 01/28/16, Historical Med    vitamin B-12 (CYANOCOBALAMIN) 250 MCG tablet Take 250 mcg by mouth daily., Until Discontinued, Historical Med      STOP taking these medications     valACYclovir (VALTREX) 500 MG tablet  Today    VITAL SIGNS:  Blood pressure 176/53, pulse 61, temperature 97.7 F (36.5 C), temperature source Oral, resp. rate 18, height 5\' 3"  (1.6 m), weight 53.071 kg (117 lb), SpO2 96 %.  I/O:  No intake or output data in the 24 hours ending 07/10/15 1813  PHYSICAL EXAMINATION:  Physical Exam  GENERAL:  80 y.o.-year-old patient lying in the bed with no acute distress.  LUNGS: Normal breath sounds bilaterally, no wheezing, rales,rhonchi or crepitation. No use of accessory muscles of respiration.  CARDIOVASCULAR: S1, S2 normal. No murmurs, rubs, or gallops.  ABDOMEN: Soft, non-tender, non-distended. Bowel sounds present. No organomegaly or mass.  NEUROLOGIC: Moves all 4 extremities. PSYCHIATRIC: The patient is alert and Awake SKIN: Redness and pain right leg significantly improved  DATA REVIEW:   CBC  Recent Labs Lab  07/08/15 0545  WBC 0.4*  HGB 7.7*  HCT 23.2*  PLT 18*    Chemistries   Recent Labs Lab 07/06/15 1316 07/07/15 0619  NA 139 140  K 4.3 4.1  CL 108 111  CO2 26 27  GLUCOSE 163* 72  BUN 17 18  CREATININE 0.92 1.00  CALCIUM 9.2 8.7*  AST 15  --   ALT 8*  --   ALKPHOS 98  --   BILITOT 1.1  --     Cardiac Enzymes No results for input(s): TROPONINI in the last 168 hours.  Microbiology Results  Results for orders placed or performed during the hospital encounter of 07/06/15  Urine culture     Status: None   Collection Time: 07/06/15  1:26 PM  Result Value Ref Range Status   Specimen Description URINE, RANDOM  Final   Special Requests NONE  Final   Culture INSIGNIFICANT GROWTH  Final   Report Status 07/07/2015 FINAL  Final  Culture, blood (routine x 2)     Status: None (Preliminary result)   Collection Time: 07/06/15  3:38 PM  Result Value Ref Range Status   Specimen Description BLOOD LEFT PICC  Final   Special Requests BOTTLES DRAWN AEROBIC AND ANAEROBIC  2CC  Final   Culture NO GROWTH 3 DAYS  Final   Report Status PENDING  Incomplete  Culture, blood (routine x 2)     Status: None (Preliminary result)   Collection Time: 07/06/15  3:38 PM  Result Value Ref Range Status   Specimen Description BLOOD RIGHT FOREARM  Final   Special Requests BOTTLES DRAWN AEROBIC AND ANAEROBIC  1CC  Final   Culture NO GROWTH 3 DAYS  Final   Report Status PENDING  Incomplete    RADIOLOGY:  No results found.    Follow up with PCP in 1 week.  Management plans discussed with the patient, family and they are in agreement.  CODE STATUS:  Code Status History    Date Active Date Inactive Code Status Order ID Comments User Context   07/06/2015  8:08 PM 07/08/2015  7:12 PM DNR [id number]  Gladstone Lighter, MD Inpatient   07/02/2015  1:00 PM 07/04/2015 10:34 PM Full Code [id number]  Lloyd Huger, MD Inpatient   12/30/2014  4:57 PM 01/03/2015  7:16 PM DNR [id number]  Baxter Hire, MD  Inpatient   12/24/2014  8:03 AM 12/26/2014  6:53 PM DNR [id number]  Adin Hector, MD Inpatient   12/21/2014 10:56 AM 12/24/2014  8:03 AM Full Code [id number]  Fritzi Mandes, MD Inpatient   11/01/2014  3:39 AM 11/02/2014  3:34 PM DNR [id number]  Prince Solian,  RN Inpatient   10/31/2014 12:36 PM 11/01/2014  3:39 AM DNR [id number]  Grayland Jack Phifer, MD Inpatient   10/30/2014  9:17 PM 10/31/2014 12:36 PM Full Code [id number]  Henreitta Leber, MD Inpatient    Questions for Most Recent Historical Code Status (Order [id number])    Question Answer Comment   In the event of cardiac or respiratory ARREST Do not call a "code blue"    In the event of cardiac or respiratory ARREST Do not perform Intubation, CPR, defibrillation or ACLS    In the event of cardiac or respiratory ARREST Use medication by any route, position, wound care, and other measures to relive pain and suffering. May use oxygen, suction and manual treatment of airway obstruction as needed for comfort.     Advance Directive Documentation        Most Recent Value   Type of Advance Directive  Healthcare Power of Attorney, Living will   Pre-existing out of facility DNR order (yellow form or pink MOST form)     "MOST" Form in Place?        TOTAL TIME TAKING CARE OF THIS PATIENT ON DAY OF DISCHARGE: more than 30 minutes.    Hillary Bow R M.D on 07/10/2015 at 6:13 PM  Between 7am to 6pm - Pager - [phone]  After 6pm go to [email or web address] - password [email or web address] [place]  Office  [phone]  CC: Primary care physician; Adin Hector, MD  Note: This dictation was prepared with Dragon dictation along with smaller phrase technology. Any transcriptional errors that result from this process are unintentional.

## 2015-07-11 LAB — CULTURE, BLOOD (ROUTINE X 2)
Culture: NO GROWTH
Culture: NO GROWTH

## 2015-07-16 ENCOUNTER — Inpatient Hospital Stay: Payer: Medicare Other

## 2015-07-16 ENCOUNTER — Inpatient Hospital Stay
Admission: EM | Admit: 2015-07-16 | Discharge: 2015-08-07 | DRG: 809 | Disposition: E | Payer: Medicare Other | Attending: Internal Medicine | Admitting: Internal Medicine

## 2015-07-16 ENCOUNTER — Inpatient Hospital Stay: Payer: Medicare Other | Admitting: Oncology

## 2015-07-16 ENCOUNTER — Encounter: Payer: Self-pay | Admitting: Emergency Medicine

## 2015-07-16 DIAGNOSIS — E1122 Type 2 diabetes mellitus with diabetic chronic kidney disease: Secondary | ICD-10-CM | POA: Diagnosis present

## 2015-07-16 DIAGNOSIS — Z87891 Personal history of nicotine dependence: Secondary | ICD-10-CM

## 2015-07-16 DIAGNOSIS — E7800 Pure hypercholesterolemia, unspecified: Secondary | ICD-10-CM | POA: Diagnosis present

## 2015-07-16 DIAGNOSIS — Z952 Presence of prosthetic heart valve: Secondary | ICD-10-CM

## 2015-07-16 DIAGNOSIS — H409 Unspecified glaucoma: Secondary | ICD-10-CM | POA: Diagnosis present

## 2015-07-16 DIAGNOSIS — R7881 Bacteremia: Secondary | ICD-10-CM | POA: Diagnosis present

## 2015-07-16 DIAGNOSIS — Z7984 Long term (current) use of oral hypoglycemic drugs: Secondary | ICD-10-CM

## 2015-07-16 DIAGNOSIS — Z515 Encounter for palliative care: Secondary | ICD-10-CM | POA: Diagnosis present

## 2015-07-16 DIAGNOSIS — Z8249 Family history of ischemic heart disease and other diseases of the circulatory system: Secondary | ICD-10-CM

## 2015-07-16 DIAGNOSIS — R5081 Fever presenting with conditions classified elsewhere: Secondary | ICD-10-CM | POA: Diagnosis present

## 2015-07-16 DIAGNOSIS — K922 Gastrointestinal hemorrhage, unspecified: Secondary | ICD-10-CM | POA: Diagnosis not present

## 2015-07-16 DIAGNOSIS — B9620 Unspecified Escherichia coli [E. coli] as the cause of diseases classified elsewhere: Secondary | ICD-10-CM | POA: Diagnosis present

## 2015-07-16 DIAGNOSIS — Z9221 Personal history of antineoplastic chemotherapy: Secondary | ICD-10-CM

## 2015-07-16 DIAGNOSIS — R509 Fever, unspecified: Secondary | ICD-10-CM

## 2015-07-16 DIAGNOSIS — G20 Parkinson's disease: Secondary | ICD-10-CM | POA: Diagnosis present

## 2015-07-16 DIAGNOSIS — I130 Hypertensive heart and chronic kidney disease with heart failure and stage 1 through stage 4 chronic kidney disease, or unspecified chronic kidney disease: Secondary | ICD-10-CM | POA: Diagnosis present

## 2015-07-16 DIAGNOSIS — R5381 Other malaise: Secondary | ICD-10-CM

## 2015-07-16 DIAGNOSIS — C9200 Acute myeloblastic leukemia, not having achieved remission: Secondary | ICD-10-CM | POA: Diagnosis present

## 2015-07-16 DIAGNOSIS — N179 Acute kidney failure, unspecified: Secondary | ICD-10-CM | POA: Diagnosis present

## 2015-07-16 DIAGNOSIS — R531 Weakness: Secondary | ICD-10-CM

## 2015-07-16 DIAGNOSIS — I4891 Unspecified atrial fibrillation: Secondary | ICD-10-CM | POA: Diagnosis present

## 2015-07-16 DIAGNOSIS — I5032 Chronic diastolic (congestive) heart failure: Secondary | ICD-10-CM | POA: Diagnosis present

## 2015-07-16 DIAGNOSIS — I25810 Atherosclerosis of coronary artery bypass graft(s) without angina pectoris: Secondary | ICD-10-CM | POA: Diagnosis present

## 2015-07-16 DIAGNOSIS — E785 Hyperlipidemia, unspecified: Secondary | ICD-10-CM | POA: Diagnosis present

## 2015-07-16 DIAGNOSIS — A047 Enterocolitis due to Clostridium difficile: Secondary | ICD-10-CM | POA: Diagnosis present

## 2015-07-16 DIAGNOSIS — E039 Hypothyroidism, unspecified: Secondary | ICD-10-CM | POA: Diagnosis present

## 2015-07-16 DIAGNOSIS — R5383 Other fatigue: Secondary | ICD-10-CM | POA: Diagnosis not present

## 2015-07-16 DIAGNOSIS — K219 Gastro-esophageal reflux disease without esophagitis: Secondary | ICD-10-CM | POA: Diagnosis present

## 2015-07-16 DIAGNOSIS — Z950 Presence of cardiac pacemaker: Secondary | ICD-10-CM

## 2015-07-16 DIAGNOSIS — I10 Essential (primary) hypertension: Secondary | ICD-10-CM | POA: Diagnosis present

## 2015-07-16 DIAGNOSIS — E860 Dehydration: Secondary | ICD-10-CM | POA: Diagnosis present

## 2015-07-16 DIAGNOSIS — Z79899 Other long term (current) drug therapy: Secondary | ICD-10-CM | POA: Diagnosis not present

## 2015-07-16 DIAGNOSIS — Z923 Personal history of irradiation: Secondary | ICD-10-CM | POA: Diagnosis not present

## 2015-07-16 DIAGNOSIS — D649 Anemia, unspecified: Secondary | ICD-10-CM | POA: Diagnosis present

## 2015-07-16 DIAGNOSIS — N189 Chronic kidney disease, unspecified: Secondary | ICD-10-CM | POA: Diagnosis present

## 2015-07-16 DIAGNOSIS — D61818 Other pancytopenia: Secondary | ICD-10-CM | POA: Diagnosis present

## 2015-07-16 DIAGNOSIS — Z66 Do not resuscitate: Secondary | ICD-10-CM | POA: Diagnosis present

## 2015-07-16 DIAGNOSIS — L03115 Cellulitis of right lower limb: Secondary | ICD-10-CM

## 2015-07-16 DIAGNOSIS — Z951 Presence of aortocoronary bypass graft: Secondary | ICD-10-CM | POA: Diagnosis not present

## 2015-07-16 DIAGNOSIS — I2510 Atherosclerotic heart disease of native coronary artery without angina pectoris: Secondary | ICD-10-CM | POA: Diagnosis present

## 2015-07-16 DIAGNOSIS — D696 Thrombocytopenia, unspecified: Secondary | ICD-10-CM

## 2015-07-16 DIAGNOSIS — E119 Type 2 diabetes mellitus without complications: Secondary | ICD-10-CM

## 2015-07-16 LAB — URINALYSIS COMPLETE WITH MICROSCOPIC (ARMC ONLY)
BILIRUBIN URINE: NEGATIVE
Bacteria, UA: NONE SEEN
Glucose, UA: NEGATIVE mg/dL
HGB URINE DIPSTICK: NEGATIVE
KETONES UR: NEGATIVE mg/dL
LEUKOCYTES UA: NEGATIVE
Nitrite: NEGATIVE
PH: 5 (ref 5.0–8.0)
Protein, ur: NEGATIVE mg/dL
RBC / HPF: NONE SEEN RBC/hpf (ref 0–5)
Specific Gravity, Urine: 1.012 (ref 1.005–1.030)
WBC, UA: NONE SEEN WBC/hpf (ref 0–5)

## 2015-07-16 LAB — CBC
HCT: 12.2 % — CL (ref 35.0–47.0)
HCT: 23.2 % — ABNORMAL LOW (ref 35.0–47.0)
HEMOGLOBIN: 4.1 g/dL — AB (ref 12.0–16.0)
Hemoglobin: 7.9 g/dL — ABNORMAL LOW (ref 12.0–16.0)
MCH: 31.4 pg (ref 26.0–34.0)
MCH: 29.8 pg (ref 26.0–34.0)
MCHC: 33.9 g/dL (ref 32.0–36.0)
MCHC: 34 g/dL (ref 32.0–36.0)
MCV: 87.9 fL (ref 80.0–100.0)
MCV: 92.5 fL (ref 80.0–100.0)
PLATELETS: 11 10*3/uL — AB (ref 150–440)
PLATELETS: 15 10*3/uL — AB (ref 150–440)
RBC: 1.32 MIL/uL — AB (ref 3.80–5.20)
RBC: 2.64 MIL/uL — AB (ref 3.80–5.20)
RDW: 17 % — ABNORMAL HIGH (ref 11.5–14.5)
RDW: 14.3 % (ref 11.5–14.5)
WBC: 0.5 10*3/uL — CL (ref 3.6–11.0)
WBC: 0.6 10*3/uL — AB (ref 3.6–11.0)

## 2015-07-16 LAB — BASIC METABOLIC PANEL
ANION GAP: 3 — AB (ref 5–15)
Anion gap: 3 — ABNORMAL LOW (ref 5–15)
BUN: 65 mg/dL — ABNORMAL HIGH (ref 6–20)
BUN: 56 mg/dL — ABNORMAL HIGH (ref 6–20)
CALCIUM: 8.6 mg/dL — AB (ref 8.9–10.3)
CHLORIDE: 112 mmol/L — AB (ref 101–111)
CO2: 24 mmol/L (ref 22–32)
CO2: 24 mmol/L (ref 22–32)
CREATININE: 1.12 mg/dL — AB (ref 0.44–1.00)
CREATININE: 1.33 mg/dL — AB (ref 0.44–1.00)
Calcium: 8.8 mg/dL — ABNORMAL LOW (ref 8.9–10.3)
Chloride: 114 mmol/L — ABNORMAL HIGH (ref 101–111)
GFR calc non Af Amer: 37 mL/min — ABNORMAL LOW (ref >60)
GFR calc non Af Amer: 46 mL/min — ABNORMAL LOW (ref >60)
GFR, EST AFRICAN AMERICAN: 43 mL/min — AB (ref >60)
GFR, EST AFRICAN AMERICAN: 53 mL/min — AB (ref >60)
GLUCOSE: 109 mg/dL — AB (ref 65–99)
Glucose, Bld: 191 mg/dL — ABNORMAL HIGH (ref 65–99)
Potassium: 4.9 mmol/L (ref 3.5–5.1)
Potassium: 4.4 mmol/L (ref 3.5–5.1)
Sodium: 139 mmol/L (ref 135–145)
Sodium: 141 mmol/L (ref 135–145)

## 2015-07-16 LAB — PREPARE RBC (CROSSMATCH)

## 2015-07-16 LAB — GLUCOSE, CAPILLARY
GLUCOSE-CAPILLARY: 159 mg/dL — AB (ref 65–99)
Glucose-Capillary: 117 mg/dL — ABNORMAL HIGH (ref 65–99)
Glucose-Capillary: 70 mg/dL (ref 65–99)
Glucose-Capillary: 100 mg/dL — ABNORMAL HIGH (ref 65–99)

## 2015-07-16 LAB — TROPONIN I: TROPONIN I: 0.03 ng/mL (ref <0.031)

## 2015-07-16 MED ORDER — ONDANSETRON HCL 4 MG/2ML IJ SOLN: 4.0000 mg | Freq: Four times a day (QID) | INTRAMUSCULAR | Status: DC | PRN

## 2015-07-16 MED ORDER — ONDANSETRON HCL 4 MG/2ML IJ SOLN
4.0000 mg | Freq: Once | INTRAMUSCULAR | Status: AC
Start: 2015-07-16 — End: 2015-07-16
  Administered 2015-07-16: 4 mg via INTRAVENOUS

## 2015-07-16 MED ORDER — LATANOPROST 0.005 % OP SOLN
1.0000 [drp] | Freq: Every day | OPHTHALMIC | Status: DC
  Administered 2015-07-16 – 2015-07-17 (×2): 1 [drp] via OPHTHALMIC
  Filled 2015-07-16 (×2): qty 2.5

## 2015-07-16 MED ORDER — INSULIN ASPART 100 UNIT/ML ~~LOC~~ SOLN: 0.0000 [IU] | Freq: Every day | SUBCUTANEOUS | Status: DC

## 2015-07-16 MED ORDER — BRIMONIDINE TARTRATE 0.15 % OP SOLN
1.0000 [drp] | Freq: Two times a day (BID) | OPHTHALMIC | Status: DC
  Administered 2015-07-16 – 2015-07-17 (×4): 1 [drp] via OPHTHALMIC
  Filled 2015-07-16 (×2): qty 5

## 2015-07-16 MED ORDER — LEVOTHYROXINE SODIUM 100 MCG PO TABS
100.0000 ug | ORAL_TABLET | Freq: Every day | ORAL | Status: DC
  Administered 2015-07-16 – 2015-07-17 (×2): 100 ug via ORAL
  Filled 2015-07-16 (×3): qty 1

## 2015-07-16 MED ORDER — ONDANSETRON HCL 4 MG PO TABS: 4.0000 mg | ORAL_TABLET | Freq: Four times a day (QID) | ORAL | Status: DC | PRN

## 2015-07-16 MED ORDER — ACETAMINOPHEN 325 MG PO TABS
650.0000 mg | ORAL_TABLET | Freq: Four times a day (QID) | ORAL | Status: DC | PRN
  Administered 2015-07-17: 21:00:00 650 mg via ORAL
  Filled 2015-07-16: qty 2

## 2015-07-16 MED ORDER — SODIUM CHLORIDE 0.9 % IV SOLN
10.0000 mL/h | Freq: Once | INTRAVENOUS | Status: AC
  Administered 2015-07-16: 10 mL/h via INTRAVENOUS

## 2015-07-16 MED ORDER — POSACONAZOLE 100 MG PO TBEC
300.0000 mg | DELAYED_RELEASE_TABLET | Freq: Every day | ORAL | Status: DC
  Administered 2015-07-16 – 2015-07-17 (×2): 300 mg via ORAL
  Filled 2015-07-16 (×5): qty 3

## 2015-07-16 MED ORDER — SIMVASTATIN 20 MG PO TABS
20.0000 mg | ORAL_TABLET | Freq: Every day | ORAL | Status: DC
  Administered 2015-07-16 – 2015-07-17 (×2): 20 mg via ORAL
  Filled 2015-07-16 (×2): qty 1

## 2015-07-16 MED ORDER — PANTOPRAZOLE SODIUM 40 MG PO TBEC
40.0000 mg | DELAYED_RELEASE_TABLET | Freq: Every day | ORAL | Status: DC
  Administered 2015-07-16: 40 mg via ORAL
  Filled 2015-07-16: qty 1

## 2015-07-16 MED ORDER — ONDANSETRON HCL 4 MG/2ML IJ SOLN
4.0000 mg | Freq: Once | INTRAMUSCULAR | Status: AC
  Administered 2015-07-16: 4 mg via INTRAVENOUS
  Filled 2015-07-16: qty 2

## 2015-07-16 MED ORDER — SODIUM CHLORIDE 0.9 % IV SOLN
INTRAVENOUS | Status: AC
  Administered 2015-07-16: 20:00:00 50 mL via INTRAVENOUS

## 2015-07-16 MED ORDER — SOTALOL HCL 80 MG PO TABS
40.0000 mg | ORAL_TABLET | Freq: Two times a day (BID) | ORAL | Status: DC
  Administered 2015-07-16 – 2015-07-17 (×4): 40 mg via ORAL
  Filled 2015-07-16 (×4): qty 1
  Filled 2015-07-16: qty 2

## 2015-07-16 MED ORDER — PROMETHAZINE HCL 25 MG/ML IJ SOLN
12.5000 mg | Freq: Four times a day (QID) | INTRAMUSCULAR | Status: DC | PRN
Start: 2015-07-16 — End: 2015-07-18

## 2015-07-16 MED ORDER — ONDANSETRON HCL 4 MG/2ML IJ SOLN
INTRAMUSCULAR | Status: AC
  Administered 2015-07-16: 4 mg via INTRAVENOUS
  Filled 2015-07-16: qty 2

## 2015-07-16 MED ORDER — PANTOPRAZOLE SODIUM 40 MG PO TBEC
40.0000 mg | DELAYED_RELEASE_TABLET | Freq: Two times a day (BID) | ORAL | Status: DC
Start: 2015-07-16 — End: 2015-07-18
  Administered 2015-07-16 – 2015-07-17 (×3): 40 mg via ORAL
  Filled 2015-07-16 (×4): qty 1

## 2015-07-16 MED ORDER — ACETAMINOPHEN 650 MG RE SUPP
650.0000 mg | Freq: Four times a day (QID) | RECTAL | Status: DC | PRN
  Administered 2015-07-19: 650 mg via RECTAL
  Filled 2015-07-16: qty 1

## 2015-07-16 MED ORDER — AMOXICILLIN-POT CLAVULANATE 875-125 MG PO TABS
1.0000 | ORAL_TABLET | Freq: Two times a day (BID) | ORAL | Status: DC
  Administered 2015-07-16: 1 via ORAL
  Filled 2015-07-16: qty 1

## 2015-07-16 MED ORDER — INSULIN ASPART 100 UNIT/ML ~~LOC~~ SOLN
0.0000 [IU] | Freq: Three times a day (TID) | SUBCUTANEOUS | Status: DC
  Administered 2015-07-16: 2 [IU] via SUBCUTANEOUS
  Administered 2015-07-17 – 2015-07-18 (×2): 1 [IU] via SUBCUTANEOUS
  Filled 2015-07-16 (×2): qty 1
  Filled 2015-07-16: qty 2

## 2015-07-16 NOTE — ED Notes (Signed)
Lab called with several critical low labs: WBC 0.6 Hematocrit 12.2 Hemoglobin 4.1 Platelets 15  Dr. Owens Shark was notified.

## 2015-07-16 NOTE — Care Management (Signed)
Admitted to [facility] with the diagnosis of anemia. Discharged from this facility 07/08/15. Dr. Ramonita Lab is primary care physician. Followed by [facility] for PICC line dressings. [facility], [facility], and [facility] in the past. [facility] in the past (September 2016). Uses wheelchair, cane, and rolling walker to aid in ambulation. Husband helps with basic needs. Hgb 4.1 on admission. Receiving blood transfusions. Shelbie Ammons RN MSN CCM Care Management [phone]

## 2015-07-16 NOTE — Consult Note (Signed)
[place]  [phone] [phone]) [phone]  ID: JANINE RELLER OB: 1935-12-27  MR#: [id number]  BSJ#:[id number]  Patient Care Team: Adin Hector, MD as PCP - General (Internal Medicine)  CHIEF COMPLAINT:  Chief Complaint  Patient presents with  . Weakness    INTERVAL HISTORY: Patient is a 80 year old female with AML who has had multiple hospital admissions over the last month for severe anemia and cellulitis. She is readmitted for worsening weakness and fatigue and found to have a hemoglobin of ~4.0. Patient states she feels improved since admission. She denies any fevers. She has no neurologic complaints. She has a fair appetite. She denies any chest pain or shortness of breath. She has no nausea, vomiting, constipation, or diarrhea. She denies any easy bleeding or bruising. Patient otherwise feels well and offers no further specific complaints.  REVIEW OF SYSTEMS:   Review of Systems  Constitutional: Positive for malaise/fatigue. Negative for fever.  Respiratory: Negative.  Negative for shortness of breath.   Cardiovascular: Negative.   Gastrointestinal: Negative for blood in stool and melena.  Musculoskeletal: Negative.   Skin:       Erythema on right lower extremity resolved.  Neurological: Negative.     As per HPI. Otherwise, a complete review of systems is negatve.  PAST MEDICAL HISTORY: Past Medical History  Diagnosis Date  . Atrial fibrillation ([place])   . Parkinson's disease ([place])   . Glaucoma   . Hypertension   . Hypothyroidism   . GERD (gastroesophageal reflux disease)   . High cholesterol   . Diabetes mellitus without complication ([place])   . CAD (coronary artery disease) of bypass graft   . History of appendectomy   . H/O: hysterectomy   . Leukemia ([place])     AML diagnosed 2016- refused treatment  . Rheumatic heart disease     s/p mitral stenosis  . Chronic leukopenia   . Thrombocytopenia ([place])   . Anemia     PAST  SURGICAL HISTORY: Past Surgical History  Procedure Laterality Date  . Cardiac valve replacement      mitral valve replacement with mechanical valve  . Pacemaker insertion    . Peripheral vascular catheterization N/A 05/10/2015    Procedure: PICC Line Insertion;  Surgeon: Katha Cabal, MD;  Location: [facility];  Service: Cardiovascular;  Laterality: N/A;  . Abdominal hysterectomy    . Appendectomy      FAMILY HISTORY Family History  Problem Relation Age of Onset  . CAD Other   . Heart disease Father        ADVANCED DIRECTIVES:    HEALTH MAINTENANCE: Social History  Substance Use Topics  . Smoking status: Former Smoker -- 1.00 packs/day for 10 years    Types: Cigarettes  . Smokeless tobacco: None     Comment: quit about 5 years ago  . Alcohol Use: No     Colonoscopy:  PAP:  Bone density:  Lipid panel:  Allergies  Allergen Reactions  . Hydrochlorothiazide Other (See Comments)    Reaction:  Worsening hypercalcaemia  . Lisinopril Other (See Comments) and Cough    Reaction:  Worsening renal insufficiency at 60m dose.       Current Facility-Administered Medications  Medication Dose Route Frequency Provider Last Rate Last Dose  . 0.9 %  sodium chloride infusion   Intravenous Continuous QDemetrios Loll MD      . acetaminophen (TYLENOL) tablet 650 mg  650 mg Oral Q6H PRN DLance Coon MD  Or  . acetaminophen (TYLENOL) suppository 650 mg  650 mg Rectal Q6H PRN Lance Coon, MD      . brimonidine (ALPHAGAN) 0.15 % ophthalmic solution 1 drop  1 drop Both Eyes BID Lance Coon, MD   1 drop at 07/31/2015 1144  . insulin aspart (novoLOG) injection 0-5 Units  0-5 Units Subcutaneous QHS Lance Coon, MD      . insulin aspart (novoLOG) injection 0-9 Units  0-9 Units Subcutaneous TID WC Lance Coon, MD   2 Units at 07/23/2015 0850  . latanoprost (XALATAN) 0.005 % ophthalmic solution 1 drop  1 drop Both Eyes QHS Lance Coon, MD      . levothyroxine (SYNTHROID,  LEVOTHROID) tablet 100 mcg  100 mcg Oral QAC breakfast Lance Coon, MD   100 mcg at 07/30/2015 0849  . ondansetron (ZOFRAN) tablet 4 mg  4 mg Oral Q6H PRN Lance Coon, MD       Or  . ondansetron [facility]) injection 4 mg  4 mg Intravenous Q6H PRN Lance Coon, MD      . pantoprazole (PROTONIX) EC tablet 40 mg  40 mg Oral BID Lance Coon, MD   40 mg at 07/22/2015 0849  . posaconazole (NOXAFIL) delayed-release tablet 300 mg  300 mg Oral Daily Lance Coon, MD   300 mg at 08/06/2015 1144  . promethazine (PHENERGAN) injection 12.5 mg  12.5 mg Intravenous Q6H PRN Lance Coon, MD      . simvastatin (ZOCOR) tablet 20 mg  20 mg Oral QHS Lance Coon, MD      . sotalol (BETAPACE) tablet 40 mg  40 mg Oral BID Lance Coon, MD   40 mg at 07/15/2015 9476   Facility-Administered Medications Ordered in Other Encounters  Medication Dose Route Frequency Provider Last Rate Last Dose  . 0.9 %  sodium chloride infusion   Intravenous Continuous Lloyd Huger, MD 999 mL/hr at 10/29/14 1550    . heparin flush 10 UNIT/ML injection 10 Units  10 Units Intravenous Once Lloyd Huger, MD      . sodium chloride 0.9 % injection 10 mL  10 mL Intracatheter PRN Lloyd Huger, MD   10 mL at 06/04/15 1609  . sodium chloride 0.9 % injection 10 mL  10 mL Intracatheter PRN Lloyd Huger, MD        OBJECTIVE: Filed Vitals:   07/17/2015 1345 07/25/2015 1400  BP: 140/41 138/51  Pulse: 59 58  Temp: 97.5 F (36.4 C) 98.1 F (36.7 C)  Resp: 18 20     Body mass index is 20.55 kg/(m^2).    ECOG FS:3 - Symptomatic, >50% confined to bed  General: thin, no acute distress. Eyes: Pink conjunctiva, anicteric sclera. HEENT: Normocephalic, moist mucous membranes, clear oropharnyx. Lungs: Clear to auscultation bilaterally. Heart: Regular rate and rhythm. No rubs, murmurs, or gallops. Abdomen: Soft, nontender, nondistended. No organomegaly noted, normoactive bowel sounds. Musculoskeletal: No edema, cyanosis, or  clubbing. Neuro: Alert, answering all questions appropriately. Cranial nerves grossly intact. Skin: Areas of erythema and warmth on right lower extremity are resolved.  Psych: Normal affect. Lymphatics: No cervical, calvicular, axillary or inguinal LAD.   LAB RESULTS:  Lab Results  Component Value Date   NA 139 07/18/2015   K 4.9 08/02/2015   CL 112* 07/18/2015   CO2 24 07/14/2015   GLUCOSE 191* 07/21/2015   BUN 65* 07/12/2015   CREATININE 1.33* 07/25/2015   CALCIUM 8.8* 07/24/2015   PROT 6.7 07/06/2015   ALBUMIN 3.2* 07/06/2015  AST 15 07/06/2015   ALT 8* 07/06/2015   ALKPHOS 98 07/06/2015   BILITOT 1.1 07/06/2015   GFRNONAA 37* 07/10/2015   GFRAA 43* 08/04/2015    Lab Results  Component Value Date   WBC 0.6* 07/14/2015   NEUTROABS 0.0* 07/08/2015   HGB 4.1* 07/15/2015   HCT 12.2* 07/13/2015   MCV 92.5 07/22/2015   PLT 15* 07/17/2015     STUDIES: US Venous Img Lower Unilateral Right  07/06/2015  CLINICAL DATA:  Right leg swelling EXAM: RIGHT LOWER EXTREMITY VENOUS DOPPLER ULTRASOUND TECHNIQUE: Gray-scale sonography with graded compression, as well as color Doppler and duplex ultrasound were performed to evaluate the lower extremity deep venous systems from the level of the common femoral vein and including the common femoral, femoral, profunda femoral, popliteal and calf veins including the posterior tibial, peroneal and gastrocnemius veins when visible. The superficial great saphenous vein was also interrogated. Spectral Doppler was utilized to evaluate flow at rest and with distal augmentation maneuvers in the common femoral, femoral and popliteal veins. COMPARISON:  None. FINDINGS: Contralateral Common Femoral Vein: Respiratory phasicity is normal and symmetric with the symptomatic side. No evidence of thrombus. Normal compressibility. Common Femoral Vein: No evidence of thrombus. Normal compressibility, respiratory phasicity and response to augmentation. Saphenofemoral  Junction: No evidence of thrombus. Normal compressibility and flow on color Doppler imaging. Profunda Femoral Vein: No evidence of thrombus. Normal compressibility and flow on color Doppler imaging. Femoral Vein: No evidence of thrombus. Normal compressibility, respiratory phasicity and response to augmentation. Popliteal Vein: No evidence of thrombus. Normal compressibility, respiratory phasicity and response to augmentation. Calf Veins: No evidence of thrombus. Normal compressibility and flow on color Doppler imaging. Superficial Great Saphenous Vein: No evidence of thrombus. Normal compressibility and flow on color Doppler imaging. Venous Reflux:  None. Other Findings:  None. IMPRESSION: No evidence of deep venous thrombosis. Electronically Signed   By: Rolm Baptise M.D.   On: 07/06/2015 15:04    ASSESSMENT: AML, pancytopenia, cellulitis.  PLAN:    1. AML: Given her worsening pancytopenia, there is suspicion of progression of disease. Patient continues to decline additional treatment at this time. She is not ready to enroll in hospice either. No intervention or treatment is planned at this time. Of note, her most recent bone marrow biopsy was improved, but still contained AML at approximately 19% of her sample. Patient does not wish to have another bone marrow biopsy at this time. She completed 4 cycles of azacitidine 75 mg/m IV in June 2016. If she reinitiates treatment, she will receive this treatment on days 1 through 5 as well as on day 8 and 9.  2. Thrombocytopenia: Platelet count is decreased, but stable. Likely secondary to progressive disease. Continue to hold Lovenox. 3. Anemia: This is patient's second admission in less than a month with a hemoglobin of approximately 4.0. Likely secondary to progressive disease, but patient does not wish treatment nor a bone marrow biopsy to confirm. Continue transfusion support as needed to maintain hemoglobin greater than 7.0. 4. Neutropenia: Patient's  white blood cell count and neutrophil count is decreased, but relatively unchanged.  Likely secondary to progressive disease, monitor. 5. Poor vascular access: PICC line in place with home health as needed. 6. Cellulitis: Resolved.  7. Disposition: Patient should follow-up in the [facility] approximately one week after discharge for further evaluation.  Appreciate consult, will follow.   Lloyd Huger, MD   07/18/2015 4:59 PM

## 2015-07-16 NOTE — Progress Notes (Addendum)
[facility] [place] NAME: [name]    MR#:  [id number]  DATE OF BIRTH:  Oct 12, 1935  SUBJECTIVE:  CHIEF COMPLAINT:   Chief Complaint  Patient presents with  . Weakness   Still weak but is better. REVIEW OF SYSTEMS:  CONSTITUTIONAL: No fever, has generalized weakness.  EYES: No blurred or double vision.  EARS, NOSE, AND THROAT: No tinnitus or ear pain.  RESPIRATORY: No cough, shortness of breath, wheezing or hemoptysis.  CARDIOVASCULAR: No chest pain, orthopnea, edema.  GASTROINTESTINAL: No nausea, vomiting, diarrhea or abdominal pain.  GENITOURINARY: No dysuria, hematuria.  ENDOCRINE: No polyuria, nocturia,  HEMATOLOGY: No anemia, easy bruising or bleeding SKIN: No rash or lesion. MUSCULOSKELETAL: No joint pain or arthritis.   NEUROLOGIC: No tingling, numbness, weakness.  PSYCHIATRY: No anxiety or depression.   DRUG ALLERGIES:   Allergies  Allergen Reactions  . Hydrochlorothiazide Other (See Comments)    Reaction:  Worsening hypercalcaemia  . Lisinopril Other (See Comments) and Cough    Reaction:  Worsening renal insufficiency at 40mg  dose.       VITALS:  Blood pressure 138/51, pulse 58, temperature 98.1 F (36.7 C), temperature source Oral, resp. rate 20, height 5\' 3"  (1.6 m), weight 52.617 kg (116 lb), SpO2 99 %.  PHYSICAL EXAMINATION:  GENERAL:  80 y.o.-year-old patient lying in the bed with no acute distress.  EYES: Pupils equal, round, reactive to light and accommodation. No scleral icterus. Extraocular muscles intact. Pale conjunctiva. HEENT: Head atraumatic, normocephalic. Oropharynx and nasopharynx clear.  NECK:  Supple, no jugular venous distention. No thyroid enlargement, no tenderness.  LUNGS: Normal breath sounds bilaterally, no wheezing, rales,rhonchi or crepitation. No use of accessory muscles of respiration.  CARDIOVASCULAR: S1, S2 normal. Systolic murmurs 2/6, no rubs, or gallops.  ABDOMEN: Soft,  nontender, nondistended. Bowel sounds present. No organomegaly or mass.  EXTREMITIES: No pedal edema, cyanosis, or clubbing.  NEUROLOGIC: Cranial nerves II through XII are intact. Muscle strength 4/5 in all extremities. Sensation intact. Gait not checked.  PSYCHIATRIC: The patient is alert and oriented x 3.  SKIN: No obvious rash, lesion, or ulcer.    LABORATORY PANEL:   CBC  Recent Labs Lab 07/20/2015 0044  WBC 0.6*  HGB 4.1*  HCT 12.2*  PLT 15*   ------------------------------------------------------------------------------------------------------------------  Chemistries   Recent Labs Lab 07/27/2015 0044  NA 139  K 4.9  CL 112*  CO2 24  GLUCOSE 191*  BUN 65*  CREATININE 1.33*  CALCIUM 8.8*   ------------------------------------------------------------------------------------------------------------------  Cardiac Enzymes  Recent Labs Lab 08/03/2015 0044  TROPONINI 0.03   ------------------------------------------------------------------------------------------------------------------  RADIOLOGY:  No results found.  EKG:   Orders placed or performed during the hospital encounter of 07/31/2015  . ED EKG  . EKG 12-Lead  . EKG 12-Lead  . ED EKG    ASSESSMENT AND PLAN:   Severe symptomatic anemia - secondary to her cancer in all likelihood. She does not report any acute episodes of bleeding. Hemoglobin down to 4. On PRBC transfusion. Follow-up hemoglobin in a.m.   Acute myeloid leukemia not having achieved remission ([place]) - refusing further chemotherapy treatments. Follow-up oncology for any recommendations.  Pancytopenia ([place]) - secondary to her cancer. on neutropenic precautions.   Atrial fibrillation ([place]) - currently stable, continue home rate controlling medicines   Coronary artery disease. Not on ASA or coagulation.  Essential hypertension -controlled, continue home meds  Chronic diastolic congestive heart failure ([facility]) - stable, continue home  meds  Type  2 diabetes mellitus ([place]) -controlled, on sliding scale insulin with corresponding glucose checks before meals at bedtime hold glipizide.  * Acute renal failure with dehydration. Normal saline IV and follow-up BMP. Hold the Lasix and cozaar. * Recent right leg Cellulitis. Improved, discontinue Augmentin. * History of A. fib. On sotalol.  All the records are reviewed and case discussed with Care Management/Social Workerr. Management plans discussed with the patient, family and they are in agreement.  CODE STATUS: DO NOT RESUSCITATE  TOTAL TIME TAKING CARE OF THIS PATIENT: 37 minutes.  Greater than 50% time was spent on coordination of care and face-to-face counseling.  POSSIBLE D/C IN 2 DAYS, DEPENDING ON CLINICAL CONDITION.   Demetrios Loll M.D on 08/06/2015 at 4:05 PM  Between 7am to 6pm - Pager - [phone]  After 6pm go to [email or web address] - password [email or web address] [facility]  [facility]  Office  [phone]  CC: Primary care physician; Adin Hector, MD

## 2015-07-16 NOTE — ED Notes (Signed)
Pt reports that she is still nauseous, Dr Owens Shark notified.

## 2015-07-16 NOTE — H&P (Signed)
[facility] NAME: [name]    MR#:  [id number]  DATE OF BIRTH:  1935-10-17  DATE OF ADMISSION:  07/31/2015  PRIMARY CARE PHYSICIAN: Adin Hector, MD   REQUESTING/REFERRING PHYSICIAN: Owens Shark, MD  CHIEF COMPLAINT:   Chief Complaint  Patient presents with  . Weakness    HISTORY OF PRESENT ILLNESS:  [name]  is a 80 y.o. female who presents with symptomatic anemia.  Patient has known CLL. She follows with oncology here. She has refused further chemotherapy treatments. She has chronic pancytopenia. However, today she comes in with progressive weakness. She is found to have a significant hemoglobin drop, with a hemoglobin of 4. She and her husband state that this is happened before, and she has required sometimes up to 4 units of packed red blood cells. Patient also states she is having some chest pain which radiates through to her back. She does have a history of CAD. Hospitals were called for admission.  PAST MEDICAL HISTORY:   Past Medical History  Diagnosis Date  . Atrial fibrillation ([place])   . Parkinson's disease ([place])   . Glaucoma   . Hypertension   . Hypothyroidism   . GERD (gastroesophageal reflux disease)   . High cholesterol   . Diabetes mellitus without complication ([place])   . CAD (coronary artery disease) of bypass graft   . History of appendectomy   . H/O: hysterectomy   . Leukemia ([place])     AML diagnosed 2016- refused treatment  . Rheumatic heart disease     s/p mitral stenosis  . Chronic leukopenia   . Thrombocytopenia ([place])   . Anemia     PAST SURGICAL HISTORY:   Past Surgical History  Procedure Laterality Date  . Cardiac valve replacement      mitral valve replacement with mechanical valve  . Pacemaker insertion    . Peripheral vascular catheterization N/A 05/10/2015    Procedure: PICC Line Insertion;  Surgeon: Katha Cabal, MD;  Location: [facility];  Service:  Cardiovascular;  Laterality: N/A;  . Abdominal hysterectomy    . Appendectomy      SOCIAL HISTORY:   Social History  Substance Use Topics  . Smoking status: Former Smoker -- 1.00 packs/day for 10 years    Types: Cigarettes  . Smokeless tobacco: Not on file     Comment: quit about 5 years ago  . Alcohol Use: No    FAMILY HISTORY:   Family History  Problem Relation Age of Onset  . CAD Other   . Heart disease Father     DRUG ALLERGIES:   Allergies  Allergen Reactions  . Hydrochlorothiazide Other (See Comments)    Reaction:  Worsening hypercalcaemia  . Lisinopril Other (See Comments) and Cough    Reaction:  Worsening renal insufficiency at 40mg  dose.       MEDICATIONS AT HOME:   Prior to Admission medications   Medication Sig Start Date End Date Taking? Authorizing Provider  acetaminophen (TYLENOL) 325 MG tablet Take 650 mg by mouth every 4 (four) hours as needed for mild pain or headache.    Historical Provider, MD  amoxicillin-clavulanate (AUGMENTIN) 875-125 MG tablet Take 1 tablet by mouth 2 (two) times daily. 07/08/15   Srikar Sudini, MD  brimonidine (ALPHAGAN) 0.15 % ophthalmic solution Place 1 drop into both eyes 2 (two) times daily.    Historical Provider, MD  carbidopa-levodopa (SINEMET IR) 25-100 MG per tablet  Take 1 tablet by mouth 4 (four) times daily. Patient taking differently: Take 1 tablet by mouth 3 (three) times daily.  12/26/14   Tama High III, MD  cholecalciferol (VITAMIN D) 1000 UNITS tablet Take 1,000 Units by mouth daily.    Historical Provider, MD  ferrous sulfate 325 (65 FE) MG tablet Take 325 mg by mouth daily.     Historical Provider, MD  furosemide (LASIX) 40 MG tablet Take 20 mg by mouth daily.    Historical Provider, MD  glipiZIDE (GLUCOTROL XL) 5 MG 24 hr tablet Take 5 mg by mouth daily with breakfast.    Historical Provider, MD  hydrALAZINE (APRESOLINE) 50 MG tablet Take 50 mg by mouth 2 (two) times daily.    Historical Provider, MD   isosorbide dinitrate (ISORDIL) 30 MG tablet Take 30 mg by mouth 2 (two) times daily.    Historical Provider, MD  latanoprost (XALATAN) 0.005 % ophthalmic solution Place 1 drop into both eyes at bedtime.    Historical Provider, MD  levothyroxine (SYNTHROID, LEVOTHROID) 100 MCG tablet Take 100 mcg by mouth daily before breakfast.    Historical Provider, MD  losartan (COZAAR) 100 MG tablet Take 100 mg by mouth daily.    Historical Provider, MD  omeprazole (PRILOSEC) 40 MG capsule Take 40 mg by mouth 2 (two) times daily.    Historical Provider, MD  posaconazole (NOXAFIL) 100 MG TBEC delayed-release tablet Take 300 mg by mouth daily. Reported on 07/02/2015    Historical Provider, MD  potassium chloride (K-DUR) 10 MEQ tablet Take 10 mEq by mouth 2 (two) times daily.    Historical Provider, MD  simvastatin (ZOCOR) 20 MG tablet TAKE 1 TABLET AT BEDTIME 04/20/14   Historical Provider, MD  sotalol (BETAPACE) 80 MG tablet Take 40 mg by mouth 2 (two) times daily.  01/28/15 01/28/16  Historical Provider, MD  vitamin B-12 (CYANOCOBALAMIN) 250 MCG tablet Take 250 mcg by mouth daily.    Historical Provider, MD    REVIEW OF SYSTEMS:  Review of Systems  Constitutional: Positive for malaise/fatigue. Negative for fever, chills and weight loss.  HENT: Negative for ear pain, hearing loss and tinnitus.   Eyes: Negative for blurred vision, double vision, pain and redness.  Respiratory: Negative for cough, hemoptysis and shortness of breath.   Cardiovascular: Positive for chest pain. Negative for palpitations, orthopnea and leg swelling.  Gastrointestinal: Negative for nausea, vomiting, abdominal pain, diarrhea and constipation.  Genitourinary: Negative for dysuria, frequency and hematuria.  Musculoskeletal: Negative for back pain, joint pain and neck pain.  Skin:       No acne, rash, or lesions  Neurological: Positive for weakness. Negative for dizziness, tremors and focal weakness.  Endo/Heme/Allergies: Negative  for polydipsia. Does not bruise/bleed easily.  Psychiatric/Behavioral: Negative for depression. The patient is not nervous/anxious and does not have insomnia.      VITAL SIGNS:   Filed Vitals:   07/15/2015 0039 08/05/2015 0130 08/02/2015 0200 07/11/2015 0330  BP: 141/43 124/38 107/46 124/42  Pulse: 60 60 60 60  Temp: 97.7 F (36.5 C)   97.4 F (36.3 C)  TempSrc: Oral   Oral  Resp: 20 17 20 16   Height: 5\' 3"  (1.6 m)     Weight: 52.617 kg (116 lb)     SpO2: 100% 99% 98% 100%   Wt Readings from Last 3 Encounters:  07/27/2015 52.617 kg (116 lb)  07/06/15 53.071 kg (117 lb)  07/02/15 53.213 kg (117 lb 5 oz)    PHYSICAL  EXAMINATION:  Physical Exam  Vitals reviewed. Constitutional: She is oriented to person, place, and time. She appears well-developed. She appears distressed (appears uncomfortable).  Cachectic  HENT:  Head: Normocephalic and atraumatic.  Mouth/Throat: Oropharynx is clear and moist.  Eyes: Conjunctivae and EOM are normal. Pupils are equal, round, and reactive to light. No scleral icterus.  Neck: Normal range of motion. Neck supple. No JVD present. No thyromegaly present.  Cardiovascular: Normal rate, regular rhythm and intact distal pulses.  Exam reveals no gallop and no friction rub.   Murmur (3/6 systolic murmur) heard. Respiratory: Effort normal and breath sounds normal. No respiratory distress. She has no wheezes. She has no rales.  GI: Soft. Bowel sounds are normal. She exhibits no distension. There is no tenderness.  Musculoskeletal: Normal range of motion. She exhibits no edema.  No arthritis, no gout  Lymphadenopathy:    She has no cervical adenopathy.  Neurological: She is alert and oriented to person, place, and time. No cranial nerve deficit.  No dysarthria, no aphasia  Skin: Skin is warm and dry. No rash noted. No erythema.  Psychiatric: She has a normal mood and affect. Her behavior is normal. Judgment and thought content normal.    LABORATORY PANEL:    CBC  Recent Labs Lab 08/06/2015 0044  WBC 0.6*  HGB 4.1*  HCT 12.2*  PLT 15*   ------------------------------------------------------------------------------------------------------------------  Chemistries   Recent Labs Lab 07/17/2015 0044  NA 139  K 4.9  CL 112*  CO2 24  GLUCOSE 191*  BUN 65*  CREATININE 1.33*  CALCIUM 8.8*   ------------------------------------------------------------------------------------------------------------------  Cardiac Enzymes No results for input(s): TROPONINI in the last 168 hours. ------------------------------------------------------------------------------------------------------------------  RADIOLOGY:  No results found.  EKG:   Orders placed or performed during the hospital encounter of 07/24/2015  . ED EKG  . EKG 12-Lead  . EKG 12-Lead  . ED EKG    IMPRESSION AND PLAN:  Principal Problem:   Symptomatic anemia - secondary to her cancer in all likelihood. She does not report any acute episodes of bleeding. Hemoglobin down to 4. transfusion ordered in the ED. Active Problems:   Acute myeloid leukemia not having achieved remission ([place]) - refusing further chemotherapy treatments. We will consult oncology for any recommendations.   Pancytopenia ([place]) - secondary to her cancer. She is neutropenic, we'll initiate neutropenic precautions.   Atrial fibrillation ([place]) - currently stable, continue home rate controlling medicines   Coronary artery disease - check a troponin with chest pain she is having, which is likely due to demand ischemia from her significant anemia. Continue other home meds   Essential hypertension - continue home meds   Chronic diastolic congestive heart failure ([place]) - continue home meds   Type 2 diabetes mellitus (HCC) - sliding scale insulin with corresponding glucose checks before meals at bedtime  All the records are reviewed and case discussed with ED provider. Management plans discussed with the patient  and/or family.  DVT PROPHYLAXIS: Mechanical only  GI PROPHYLAXIS: PPI  ADMISSION STATUS: Inpatient  CODE STATUS: DNR Code Status History    Date Active Date Inactive Code Status Order ID Comments User Context   07/06/2015  8:08 PM 07/08/2015  7:12 PM DNR [id number]  Gladstone Lighter, MD Inpatient   07/02/2015  1:00 PM 07/04/2015 10:34 PM Full Code [id number]  Lloyd Huger, MD Inpatient   12/30/2014  4:57 PM 01/03/2015  7:16 PM DNR [id number]  Baxter Hire, MD Inpatient   12/24/2014  8:03 AM 12/26/2014  6:53 PM DNR [id number]  Adin Hector, MD Inpatient   12/21/2014 10:56 AM 12/24/2014  8:03 AM Full Code [id number]  Fritzi Mandes, MD Inpatient   11/01/2014  3:39 AM 11/02/2014  3:34 PM DNR [id number]  Prince Solian, RN Inpatient   10/31/2014 12:36 PM 11/01/2014  3:39 AM DNR [id number]  [place] Jack Phifer, MD Inpatient   10/30/2014  9:17 PM 10/31/2014 12:36 PM Full Code [id number]  Henreitta Leber, MD Inpatient    Questions for Most Recent Historical Code Status (Order [id number])    Question Answer Comment   In the event of cardiac or respiratory ARREST Do not call a "code blue"    In the event of cardiac or respiratory ARREST Do not perform Intubation, CPR, defibrillation or ACLS    In the event of cardiac or respiratory ARREST Use medication by any route, position, wound care, and other measures to relive pain and suffering. May use oxygen, suction and manual treatment of airway obstruction as needed for comfort.     Advance Directive Documentation        Most Recent Value   Type of Advance Directive  Living will   Pre-existing out of facility DNR order (yellow form or pink MOST form)     "MOST" Form in Place?        TOTAL TIME TAKING CARE OF THIS PATIENT: 45 minutes.    Zandria Woldt [place] 08/02/2015, 3:34 AM  [facility]  Office  [phone]  CC: Primary care physician; Adin Hector, MD

## 2015-07-16 NOTE — Plan of Care (Signed)
Problem: Fluid Volume: Goal: Ability to maintain a balanced intake and output will improve Outcome: Not Progressing Pt to get a total of four units of packed red blood cells for hgb of 4.1 upon admission. First unit of blood initiated at 0526. Pt very pale and lethargic. Denies pain. Educational materials provided prior to blood transfusion to patient and spouse. Will continue to monitor.   Problem: Nutrition: Goal: Adequate nutrition will be maintained Outcome: Not Progressing Pt did not eat anything on this shift. C/o nausea. No emesis noted. Zofran given in the ED prior to admission.

## 2015-07-16 NOTE — ED Provider Notes (Signed)
[facility] Emergency Department Provider Note  ____________________________________________  Time seen: 12:40 AM  I have reviewed the triage vital signs and the nursing notes.   HISTORY  Chief Complaint Weakness      HPI [name] is a 80 y.o. female with history of leukemia status post chemotherapy and radiation presents with generalized weakness 2 days. Patient states inability to perform ADLs secondary to weakness. Patient denies any cough no fever no diarrhea or vomiting. Of note patient was recently admitted to the hospital foranemia requiring blood transfusion. Patient's husband at bedside states that the patient received 4 units of blood at that time.    Past Medical History  Diagnosis Date  . Atrial fibrillation ([place])   . Parkinson's disease ([place])   . Glaucoma   . Hypertension   . Hypothyroidism   . GERD (gastroesophageal reflux disease)   . High cholesterol   . Diabetes mellitus without complication ([place])   . CAD (coronary artery disease) of bypass graft   . History of appendectomy   . H/O: hysterectomy   . Leukemia ([place])     AML diagnosed 2016- refused treatment  . Rheumatic heart disease     s/p mitral stenosis  . Chronic leukopenia   . Thrombocytopenia ([place])   . Anemia     Patient Active Problem List   Diagnosis Date Noted  . Cellulitis 07/06/2015  . Acute myeloid leukemia not having achieved remission ([place])   . Anemia 07/02/2015  . Pulmonary edema 12/30/2014  . Chronic diastolic congestive heart failure ([place]) 12/26/2014  . Malnutrition of moderate degree ([place]) 12/23/2014  . Weakness 12/21/2014  . AML (acute myeloblastic leukemia) ([place])   . Atrial fibrillation ([place]) 10/31/2014  . Mechanical heart valve present 10/31/2014  . Coronary artery disease 10/31/2014  . Essential hypertension 10/31/2014  . Antineoplastic chemotherapy induced pancytopenia ([place]) 10/31/2014    Past Surgical History  Procedure Laterality  Date  . Cardiac valve replacement      mitral valve replacement with mechanical valve  . Pacemaker insertion    . Peripheral vascular catheterization N/A 05/10/2015    Procedure: PICC Line Insertion;  Surgeon: Katha Cabal, MD;  Location: [facility];  Service: Cardiovascular;  Laterality: N/A;  . Abdominal hysterectomy    . Appendectomy      Current Outpatient Rx  Name  Route  Sig  Dispense  Refill  . acetaminophen (TYLENOL) 325 MG tablet   Oral   Take 650 mg by mouth every 4 (four) hours as needed for mild pain or headache.         Marland Kitchen amoxicillin-clavulanate (AUGMENTIN) 875-125 MG tablet   Oral   Take 1 tablet by mouth 2 (two) times daily.   14 tablet   0   . brimonidine (ALPHAGAN) 0.15 % ophthalmic solution   Both Eyes   Place 1 drop into both eyes 2 (two) times daily.         . carbidopa-levodopa (SINEMET IR) 25-100 MG per tablet   Oral   Take 1 tablet by mouth 4 (four) times daily. Patient taking differently: Take 1 tablet by mouth 3 (three) times daily.    120 tablet   11   . cholecalciferol (VITAMIN D) 1000 UNITS tablet   Oral   Take 1,000 Units by mouth daily.         . ferrous sulfate 325 (65 FE) MG tablet   Oral   Take 325 mg by mouth daily.          Marland Kitchen  furosemide (LASIX) 40 MG tablet   Oral   Take 20 mg by mouth daily.         Marland Kitchen glipiZIDE (GLUCOTROL XL) 5 MG 24 hr tablet   Oral   Take 5 mg by mouth daily with breakfast.         . hydrALAZINE (APRESOLINE) 50 MG tablet   Oral   Take 50 mg by mouth 2 (two) times daily.         . isosorbide dinitrate (ISORDIL) 30 MG tablet   Oral   Take 30 mg by mouth 2 (two) times daily.         Marland Kitchen latanoprost (XALATAN) 0.005 % ophthalmic solution   Both Eyes   Place 1 drop into both eyes at bedtime.         Marland Kitchen levothyroxine (SYNTHROID, LEVOTHROID) 100 MCG tablet   Oral   Take 100 mcg by mouth daily before breakfast.         . losartan (COZAAR) 100 MG tablet   Oral   Take 100 mg  by mouth daily.         Marland Kitchen omeprazole (PRILOSEC) 40 MG capsule   Oral   Take 40 mg by mouth 2 (two) times daily.         . posaconazole (NOXAFIL) 100 MG TBEC delayed-release tablet   Oral   Take 300 mg by mouth daily. Reported on 07/02/2015         . potassium chloride (K-DUR) 10 MEQ tablet   Oral   Take 10 mEq by mouth 2 (two) times daily.         . simvastatin (ZOCOR) 20 MG tablet      TAKE 1 TABLET AT BEDTIME         . sotalol (BETAPACE) 80 MG tablet   Oral   Take 40 mg by mouth 2 (two) times daily.          . vitamin B-12 (CYANOCOBALAMIN) 250 MCG tablet   Oral   Take 250 mcg by mouth daily.           Allergies Hydrochlorothiazide and Lisinopril  Family History  Problem Relation Age of Onset  . CAD Other   . Heart disease Father     Social History Social History  Substance Use Topics  . Smoking status: Former Smoker -- 1.00 packs/day for 10 years    Types: Cigarettes  . Smokeless tobacco: None     Comment: quit about 5 years ago  . Alcohol Use: No    Review of Systems  Constitutional: Negative for fever. Eyes: Negative for visual changes. ENT: Negative for sore throat. Cardiovascular: Negative for chest pain. Respiratory: Negative for shortness of breath. Gastrointestinal: Negative for abdominal pain, vomiting and diarrhea. Genitourinary: Negative for dysuria. Musculoskeletal: Negative for back pain. Skin: Negative for rash. Neurological: Negative for headaches, focal weakness or numbness. Positive for generalized weakness   10-point ROS otherwise negative.  ____________________________________________   PHYSICAL EXAM:  VITAL SIGNS: ED Triage Vitals  Enc Vitals Group     BP 07/30/2015 0039 141/43 mmHg     Pulse Rate 08/02/2015 0039 60     Resp 07/23/2015 0039 20     Temp 07/31/2015 0039 97.7 F (36.5 C)     Temp Source 07/26/2015 0039 Oral     SpO2 07/28/2015 0036 100 %     Weight 07/30/2015 0039 116 lb (52.617 kg)     Height 07/24/2015  0039 5\' 3"  (1.6 m)  Head Cir --      Peak Flow --      Pain Score 07/14/2015 0040 0     Pain Loc --      Pain Edu? --      Excl. in [place]? --      Constitutional: Alert and oriented. Well appearing and in no distress. Eyes: Conjunctivae are normal. PERRL. Normal extraocular movements. ENT   Head: Normocephalic and atraumatic.   Nose: No congestion/rhinnorhea.   Mouth/Throat: Mucous membranes are moist.   Neck: No stridor. Hematological/Lymphatic/Immunilogical: No cervical lymphadenopathy. Cardiovascular: Normal rate, regular rhythm. Normal and symmetric distal pulses are present in all extremities. No murmurs, rubs, or gallops. Respiratory: Normal respiratory effort without tachypnea nor retractions. Breath sounds are clear and equal bilaterally. No wheezes/rales/rhonchi. Gastrointestinal: Soft and nontender. No distention. There is no CVA tenderness. Genitourinary: deferred Musculoskeletal: Nontender with normal range of motion in all extremities. No joint effusions.  No lower extremity tenderness nor edema. Neurologic:  Normal speech and language. No gross focal neurologic deficits are appreciated. Speech is normal.  Skin:  Skin is warm, dry and intact. Radiation burn noted on the patient's chest bilaterally Psychiatric: Mood and affect are normal. Speech and behavior are normal. Patient exhibits appropriate insight and judgment.  ____________________________________________    LABS (pertinent positives/negatives)  Labs Reviewed  BASIC METABOLIC PANEL - Abnormal; Notable for the following:    Chloride 112 (*)    Glucose, Bld 191 (*)    BUN 65 (*)    Creatinine, Ser 1.33 (*)    Calcium 8.8 (*)    GFR calc non Af Amer 37 (*)    GFR calc Af Amer 43 (*)    Anion gap 3 (*)    All other components within normal limits  CBC - Abnormal; Notable for the following:    WBC 0.6 (*)    RBC 1.32 (*)    Hemoglobin 4.1 (*)    HCT 12.2 (*)    RDW 17.0 (*)    Platelets 15  (*)    All other components within normal limits  URINALYSIS COMPLETEWITH MICROSCOPIC ([facility] ONLY) - Abnormal; Notable for the following:    Color, Urine STRAW (*)    APPearance CLEAR (*)    Squamous Epithelial / LPF 0-5 (*)    All other components within normal limits  CBG MONITORING, ED  PREPARE RBC (CROSSMATCH)     ____________________________________________   EKG ED ECG REPORT I, Fonda Rochon,  N, the attending physician, personally viewed and interpreted this ECG.   Date: 07/30/2015  EKG Time: 12:41 AM  Rate: 60  Rhythm: Normal sinus rhythm  Axis: none  Intervals: Normal  ST&T Change: None     Critical Care performed:CRITICAL CARE Performed by: Marjean Donna N   Total critical care time: 30 minutes  Critical care time was exclusive of separately billable procedures and treating other patients.  Critical care was necessary to treat or prevent imminent or life-threatening deterioration.  Critical care was time spent personally by me on the following activities: development of treatment plan with patient and/or surrogate as well as nursing, discussions with consultants, evaluation of patient's response to treatment, examination of patient, obtaining history from patient or surrogate, ordering and performing treatments and interventions, ordering and review of laboratory studies, ordering and review of radiographic studies, pulse oximetry and re-evaluation of patient's condition.  ____________________________________________   INITIAL IMPRESSION / ASSESSMENT AND PLAN / ED COURSE  Pertinent labs & imaging results that were available during my  care of the patient were reviewed by me and considered in my medical decision making (see chart for details).  Patient with symptomatic anemia with a hemoglobin of 4.4 as well as platelet counts of 15. 4 units packed red blood cells ordered patient discussed with Dr. Jannifer Franklin for hospital admission further evaluation and  management  ____________________________________________   FINAL CLINICAL IMPRESSION(S) / ED DIAGNOSES  Final diagnoses:  Symptomatic anemia  Thrombocytopenia (HCC)      Gregor Hams, MD 07/23/2015 [phone]

## 2015-07-16 NOTE — ED Notes (Signed)
Pt arrived to the ED for complaints of generalized weakness. Pt states that she was having trouble getting around because she is so weak. Pt states that she is a cancer Pt (leukemia) and is not receiving treatment at this time. Pt is AOx4 in no apparent distress.

## 2015-07-17 LAB — BASIC METABOLIC PANEL
Anion gap: 3 — ABNORMAL LOW (ref 5–15)
BUN: 59 mg/dL — ABNORMAL HIGH (ref 6–20)
CALCIUM: 8.3 mg/dL — AB (ref 8.9–10.3)
CO2: 23 mmol/L (ref 22–32)
CREATININE: 1.17 mg/dL — AB (ref 0.44–1.00)
Chloride: 112 mmol/L — ABNORMAL HIGH (ref 101–111)
GFR, EST AFRICAN AMERICAN: 51 mL/min — AB (ref >60)
GFR, EST NON AFRICAN AMERICAN: 44 mL/min — AB (ref >60)
Glucose, Bld: 114 mg/dL — ABNORMAL HIGH (ref 65–99)
Potassium: 4.2 mmol/L (ref 3.5–5.1)
SODIUM: 138 mmol/L (ref 135–145)

## 2015-07-17 LAB — CBC
HCT: 20.8 % — ABNORMAL LOW (ref 35.0–47.0)
Hemoglobin: 7.1 g/dL — ABNORMAL LOW (ref 12.0–16.0)
MCH: 30 pg (ref 26.0–34.0)
MCHC: 34 g/dL (ref 32.0–36.0)
MCV: 88.1 fL (ref 80.0–100.0)
PLATELETS: 10 10*3/uL — AB (ref 150–440)
RBC: 2.36 MIL/uL — AB (ref 3.80–5.20)
RDW: 14.5 % (ref 11.5–14.5)
WBC: 0.4 10*3/uL — AB (ref 3.6–11.0)

## 2015-07-17 LAB — GLUCOSE, CAPILLARY
GLUCOSE-CAPILLARY: 100 mg/dL — AB (ref 65–99)
GLUCOSE-CAPILLARY: 86 mg/dL (ref 65–99)
Glucose-Capillary: 146 mg/dL — ABNORMAL HIGH (ref 65–99)
Glucose-Capillary: 147 mg/dL — ABNORMAL HIGH (ref 65–99)

## 2015-07-17 LAB — HEMOGLOBIN A1C: HEMOGLOBIN A1C: 5 % (ref 4.0–6.0)

## 2015-07-17 MED ORDER — ACETAMINOPHEN 325 MG PO TABS
650.0000 mg | ORAL_TABLET | Freq: Once | ORAL | Status: AC
  Administered 2015-07-17: 650 mg via ORAL
  Filled 2015-07-17: qty 2

## 2015-07-17 NOTE — Progress Notes (Signed)
[place] at [place] NAME: [name]    MR#:  [id number]  DATE OF BIRTH:  30-Aug-1935  SUBJECTIVE:  CHIEF COMPLAINT:   Chief Complaint  Patient presents with  . Weakness   Generalized weakness but no active bleeding. REVIEW OF SYSTEMS:  CONSTITUTIONAL: No fever, has generalized weakness.  EYES: No blurred or double vision.  EARS, NOSE, AND THROAT: No tinnitus or ear pain.  RESPIRATORY: No cough, shortness of breath, wheezing or hemoptysis.  CARDIOVASCULAR: No chest pain, orthopnea, edema.  GASTROINTESTINAL: No nausea, vomiting, diarrhea or abdominal pain.  GENITOURINARY: No dysuria, hematuria.  ENDOCRINE: No polyuria, nocturia,  HEMATOLOGY: No anemia, easy bruising or bleeding SKIN: No rash or lesion. MUSCULOSKELETAL: No joint pain or arthritis.   NEUROLOGIC: No tingling, numbness, weakness.  PSYCHIATRY: No anxiety or depression.   DRUG ALLERGIES:   Allergies  Allergen Reactions  . Hydrochlorothiazide Other (See Comments)    Reaction:  Worsening hypercalcaemia  . Lisinopril Other (See Comments) and Cough    Reaction:  Worsening renal insufficiency at 40mg  dose.       VITALS:  Blood pressure 139/36, pulse 59, temperature 97.5 F (36.4 C), temperature source Oral, resp. rate 19, height 5\' 3"  (1.6 m), weight 52.617 kg (116 lb), SpO2 100 %.  PHYSICAL EXAMINATION:  GENERAL:  80 y.o.-year-old patient lying in the bed with no acute distress.  EYES: Pupils equal, round, reactive to light and accommodation. No scleral icterus. Extraocular muscles intact. Pale conjunctiva. HEENT: Head atraumatic, normocephalic. Oropharynx and nasopharynx clear.  NECK:  Supple, no jugular venous distention. No thyroid enlargement, no tenderness.  LUNGS: Normal breath sounds bilaterally, no wheezing, rales,rhonchi or crepitation. No use of accessory muscles of respiration.  CARDIOVASCULAR: S1, S2 normal. Systolic murmurs 2/6, no rubs, or  gallops.  ABDOMEN: Soft, nontender, nondistended. Bowel sounds present. No organomegaly or mass.  EXTREMITIES: No pedal edema, cyanosis, or clubbing.  NEUROLOGIC: Cranial nerves II through XII are intact. Muscle strength 4/5 in all extremities. Sensation intact. Gait not checked.  PSYCHIATRIC: The patient is alert and oriented x 3.  SKIN: No obvious rash, lesion, or ulcer.    LABORATORY PANEL:   CBC  Recent Labs Lab 07/17/15 0506  WBC 0.4*  HGB 7.1*  HCT 20.8*  PLT 10*   ------------------------------------------------------------------------------------------------------------------  Chemistries   Recent Labs Lab 07/17/15 0506  NA 138  K 4.2  CL 112*  CO2 23  GLUCOSE 114*  BUN 59*  CREATININE 1.17*  CALCIUM 8.3*   ------------------------------------------------------------------------------------------------------------------  Cardiac Enzymes  Recent Labs Lab 08/06/2015 0044  TROPONINI 0.03   ------------------------------------------------------------------------------------------------------------------  RADIOLOGY:  No results found.  EKG:   Orders placed or performed during the hospital encounter of 08/04/2015  . ED EKG  . EKG 12-Lead  . EKG 12-Lead  . ED EKG    ASSESSMENT AND PLAN:   Severe symptomatic anemia - Likely secondary to progressive disease AML. She does not report any acute episodes of bleeding. Hemoglobin down to 4. S/p PRBC transfusion. Hb is 7.1 today.  Follow-up hemoglobin in a.m.   Acute myeloid leukemia not having achieved remission ([place]) - refusing further chemotherapy treatments. Follow-up oncology for any recommendations.   Pancytopenia ([place]) - secondary to her cancer. on neutropenic precautions. Hold lovenox.   Atrial fibrillation ([place]) - currently stable, continue home rate controlling medicines   Coronary artery disease. Not on ASA or coagulation.  Essential hypertension -controlled, continue home meds  Chronic  diastolic congestive heart failure (  [place]) - stable, continue home meds  Type 2 diabetes mellitus ([place]) -controlled, on sliding scale insulin with corresponding glucose checks before meals at bedtime hold glipizide.  * Acute renal failure with dehydration. Normal saline IV and follow-up BMP. Hold the Lasix and cozaar. * Recent right leg Cellulitis. Improved, discontinue Augmentin. * History of A. fib. Rate controlled. On sotalol.  All the records are reviewed and case discussed with Care Management/Social Workerr. Management plans discussed with the patient, her husband and they are in agreement.  CODE STATUS: DO NOT RESUSCITATE  TOTAL TIME TAKING CARE OF THIS PATIENT: 37 minutes.  Greater than 50% time was spent on coordination of care and face-to-face counseling.  POSSIBLE D/C IN 2 DAYS, DEPENDING ON CLINICAL CONDITION.   Demetrios Loll M.D on 07/17/2015 at 4:01 PM  Between 7am to 6pm - Pager - [phone]  After 6pm go to [email or web address] - [email or web address] [facility]  [place] [facility]  Office  [phone]  CC: Primary care physician; Adin Hector, MD

## 2015-07-18 ENCOUNTER — Inpatient Hospital Stay: Payer: Medicare Other

## 2015-07-18 DIAGNOSIS — Z952 Presence of prosthetic heart valve: Secondary | ICD-10-CM

## 2015-07-18 DIAGNOSIS — G620 Drug-induced polyneuropathy: Secondary | ICD-10-CM

## 2015-07-18 DIAGNOSIS — K922 Gastrointestinal hemorrhage, unspecified: Secondary | ICD-10-CM

## 2015-07-18 DIAGNOSIS — I129 Hypertensive chronic kidney disease with stage 1 through stage 4 chronic kidney disease, or unspecified chronic kidney disease: Secondary | ICD-10-CM

## 2015-07-18 DIAGNOSIS — Z515 Encounter for palliative care: Secondary | ICD-10-CM

## 2015-07-18 DIAGNOSIS — N189 Chronic kidney disease, unspecified: Secondary | ICD-10-CM

## 2015-07-18 DIAGNOSIS — N179 Acute kidney failure, unspecified: Secondary | ICD-10-CM

## 2015-07-18 DIAGNOSIS — A047 Enterocolitis due to Clostridium difficile: Secondary | ICD-10-CM

## 2015-07-18 DIAGNOSIS — E1122 Type 2 diabetes mellitus with diabetic chronic kidney disease: Secondary | ICD-10-CM

## 2015-07-18 LAB — BLOOD CULTURE ID PANEL (REFLEXED)
ACINETOBACTER BAUMANNII: NOT DETECTED
CANDIDA KRUSEI: NOT DETECTED
CANDIDA TROPICALIS: NOT DETECTED
CARBAPENEM RESISTANCE: NOT DETECTED
Candida albicans: NOT DETECTED
Candida glabrata: NOT DETECTED
Candida parapsilosis: NOT DETECTED
ESCHERICHIA COLI: DETECTED — AB
Enterobacter cloacae complex: NOT DETECTED
Enterobacteriaceae species: DETECTED — AB
Enterococcus species: NOT DETECTED
Haemophilus influenzae: NOT DETECTED
Klebsiella oxytoca: NOT DETECTED
Klebsiella pneumoniae: NOT DETECTED
Listeria monocytogenes: NOT DETECTED
METHICILLIN RESISTANCE: NOT DETECTED
Neisseria meningitidis: NOT DETECTED
PROTEUS SPECIES: NOT DETECTED
PSEUDOMONAS AERUGINOSA: NOT DETECTED
SERRATIA MARCESCENS: NOT DETECTED
STAPHYLOCOCCUS AUREUS BCID: NOT DETECTED
STREPTOCOCCUS PYOGENES: NOT DETECTED
Staphylococcus species: NOT DETECTED
Streptococcus agalactiae: NOT DETECTED
Streptococcus pneumoniae: NOT DETECTED
Streptococcus species: NOT DETECTED
Vancomycin resistance: NOT DETECTED

## 2015-07-18 LAB — BASIC METABOLIC PANEL
Anion gap: 5 (ref 5–15)
BUN: 68 mg/dL — ABNORMAL HIGH (ref 6–20)
CO2: 22 mmol/L (ref 22–32)
Calcium: 8.2 mg/dL — ABNORMAL LOW (ref 8.9–10.3)
Chloride: 117 mmol/L — ABNORMAL HIGH (ref 101–111)
Creatinine, Ser: 1.4 mg/dL — ABNORMAL HIGH (ref 0.44–1.00)
GFR calc Af Amer: 41 mL/min — ABNORMAL LOW (ref >60)
GFR calc non Af Amer: 35 mL/min — ABNORMAL LOW (ref >60)
Glucose, Bld: 125 mg/dL — ABNORMAL HIGH (ref 65–99)
Potassium: 3.9 mmol/L (ref 3.5–5.1)
Sodium: 144 mmol/L (ref 135–145)

## 2015-07-18 LAB — MAGNESIUM: MAGNESIUM: 1.8 mg/dL (ref 1.7–2.4)

## 2015-07-18 LAB — OCCULT BLOOD X 1 CARD TO LAB, STOOL: FECAL OCCULT BLD: POSITIVE — AB

## 2015-07-18 LAB — CBC
HCT: 14.4 % — CL (ref 35.0–47.0)
HEMOGLOBIN: 4.9 g/dL — AB (ref 12.0–16.0)
MCH: 30.6 pg (ref 26.0–34.0)
MCHC: 34.3 g/dL (ref 32.0–36.0)
MCV: 89.3 fL (ref 80.0–100.0)
PLATELETS: 8 10*3/uL — AB (ref 150–440)
RBC: 1.61 MIL/uL — AB (ref 3.80–5.20)
RDW: 14.3 % (ref 11.5–14.5)
WBC: 0.3 10*3/uL — AB (ref 3.6–11.0)

## 2015-07-18 LAB — C DIFFICILE QUICK SCREEN W PCR REFLEX
C Diff antigen: POSITIVE — AB
C Diff toxin: NEGATIVE

## 2015-07-18 LAB — GLUCOSE, CAPILLARY
GLUCOSE-CAPILLARY: 122 mg/dL — AB (ref 65–99)
Glucose-Capillary: 102 mg/dL — ABNORMAL HIGH (ref 65–99)

## 2015-07-18 LAB — PREPARE RBC (CROSSMATCH)

## 2015-07-18 LAB — CLOSTRIDIUM DIFFICILE BY PCR: Toxigenic C. Difficile by PCR: POSITIVE — AB

## 2015-07-18 MED ORDER — SODIUM CHLORIDE 0.9 % IV SOLN
Freq: Once | INTRAVENOUS | Status: AC
  Administered 2015-07-18: 11:00:00 via INTRAVENOUS

## 2015-07-18 MED ORDER — DEXTROSE 5 % IV SOLN
1.0000 g | INTRAVENOUS | Status: DC
  Filled 2015-07-18: qty 10

## 2015-07-18 MED ORDER — METRONIDAZOLE IN NACL 5-0.79 MG/ML-% IV SOLN
500.0000 mg | Freq: Four times a day (QID) | INTRAVENOUS | Status: DC
  Administered 2015-07-18 – 2015-07-19 (×3): 500 mg via INTRAVENOUS
  Filled 2015-07-18 (×7): qty 100

## 2015-07-18 MED ORDER — SODIUM CHLORIDE 0.9 % IV BOLUS (SEPSIS)
1000.0000 mL | Freq: Once | INTRAVENOUS | Status: AC
  Administered 2015-07-18: 1000 mL via INTRAVENOUS

## 2015-07-18 MED ORDER — SODIUM CHLORIDE 0.9 % IV SOLN
INTRAVENOUS | Status: DC
  Administered 2015-07-18: 08:00:00 via INTRAVENOUS

## 2015-07-18 MED ORDER — DEXTROSE 5 % IV SOLN
2.0000 g | INTRAVENOUS | Status: DC
  Administered 2015-07-18: 2 g via INTRAVENOUS
  Filled 2015-07-18: qty 2

## 2015-07-18 MED ORDER — SODIUM CHLORIDE 0.9 % IV SOLN
2.0000 g | Freq: Two times a day (BID) | INTRAVENOUS | Status: DC
  Administered 2015-07-19: 2 g via INTRAVENOUS
  Filled 2015-07-18 (×3): qty 2

## 2015-07-18 MED ORDER — IBUPROFEN 400 MG PO TABS
400.0000 mg | ORAL_TABLET | Freq: Once | ORAL | Status: AC
  Administered 2015-07-18: 01:00:00 400 mg via ORAL
  Filled 2015-07-18: qty 1

## 2015-07-18 NOTE — Progress Notes (Signed)
[place] at [place] NAME: [name]    MR#:  [id number]  DATE OF BIRTH:  [birth year]  SUBJECTIVE:  CHIEF COMPLAINT:   Chief Complaint  Patient presents with  . Weakness    Fever 103 last night. Patient is lethargic and has generalized weakness.  Hb down to 4.9 this am. She had large dark BM 3 times this am. REVIEW OF SYSTEMS:  CONSTITUTIONAL: No fever, has generalized weakness.  EYES: No blurred or double vision.  EARS, NOSE, AND THROAT: No tinnitus or ear pain.  RESPIRATORY: No cough, shortness of breath, wheezing or hemoptysis.  CARDIOVASCULAR: No chest pain, orthopnea, edema.  GASTROINTESTINAL: No nausea or vomiting,  but has abdominal pain and diarrhea.  GENITOURINARY: No dysuria, hematuria.  ENDOCRINE: No polyuria, nocturia,  HEMATOLOGY: No anemia, easy bruising or bleeding SKIN: No rash or lesion. MUSCULOSKELETAL: No joint pain or arthritis.   NEUROLOGIC: No tingling, numbness, weakness.  PSYCHIATRY: No anxiety or depression.   DRUG ALLERGIES:   Allergies  Allergen Reactions  . Hydrochlorothiazide Other (See Comments)    Reaction:  Worsening hypercalcaemia  . Lisinopril Other (See Comments) and Cough    Reaction:  Worsening renal insufficiency at 40mg  dose.       VITALS:  Blood pressure 90/50, pulse 63, temperature 98.7 F (37.1 C), temperature source Oral, resp. rate 18, height 5\' 3"  (1.6 m), weight 52.617 kg (116 lb), SpO2 98 %.  PHYSICAL EXAMINATION:  GENERAL:  80 y.o.-year-old patient lying in the bed with no acute distress.  EYES: Pupils equal, round, reactive to light and accommodation. No scleral icterus. Extraocular muscles intact. Pale conjunctiva. HEENT: Head atraumatic, normocephalic. Marland Kitchen  NECK:  Supple, no jugular venous distention. No thyroid enlargement, no tenderness.  LUNGS: Normal breath sounds bilaterally, no wheezing, rales,rhonchi or crepitation. No use of accessory muscles of respiration.   CARDIOVASCULAR: S1, S2 normal. Systolic murmurs 2/6, no rubs, or gallops.  ABDOMEN: Soft,  Has diffuse tenderness, nondistended. Bowel sounds present. No organomegaly or mass.  EXTREMITIES: No pedal edema, cyanosis, or clubbing.  NEUROLOGIC: unable to exam. PSYCHIATRIC: The patient is awake but lethargic.  SKIN: No obvious rash, lesion, or ulcer.    LABORATORY PANEL:   CBC  Recent Labs Lab 07/18/15 0500  WBC 0.3*  HGB 4.9*  HCT 14.4*  PLT 8*   ------------------------------------------------------------------------------------------------------------------  Chemistries   Recent Labs Lab 07/18/15 0500 07/18/15 0540  NA 144  --   K 3.9  --   CL 117*  --   CO2 22  --   GLUCOSE 125*  --   BUN 68*  --   CREATININE 1.40*  --   CALCIUM 8.2*  --   MG  --  1.8   ------------------------------------------------------------------------------------------------------------------  Cardiac Enzymes  Recent Labs Lab 07/10/2015 0044  TROPONINI 0.03   ------------------------------------------------------------------------------------------------------------------  RADIOLOGY:  Dg Chest 2 View  07/18/2015  CLINICAL DATA:  Fever weakness, history of renal failure, valvular heart disease, CHF, acute myeloid leukemia not in remission. EXAM: CHEST  2 VIEW COMPARISON:  Portable chest x-ray of December 30, 2014 FINDINGS: The lungs are well-expanded. The interstitial markings are increased diffusely. There is no alveolar infiltrate. Areas of patchy confluence are noted in the right lower lung and in both upper lobes. The cardiac silhouette remains enlarged. The pulmonary vascularity is engorged and indistinct. There are CABG changes which are stable. The permanent pacemaker is in stable position. The bony thorax exhibits no acute abnormality. IMPRESSION: Bilateral  pulmonary interstitial edema or pneumonia. Underlying CHF. Electronically Signed   By: David  Martinique M.D.   On: 07/18/2015 12:35     EKG:   Orders placed or performed during the hospital encounter of 07/10/2015  . ED EKG  . EKG 12-Lead  . EKG 12-Lead  . ED EKG    ASSESSMENT AND PLAN:   Severe symptomatic anemia - Likely secondary to progressive disease AML. She does not report any acute episodes of bleeding. Hemoglobin down to 4. S/p PRBC transfusion. Hb was up 7.1, but down to 4.9 today.  Ordered 4 units PRBC transfusion. But per Dr. Grayland Ormond, AMS is worsening, recommend comfort care.   Acute myeloid leukemia not having achieved remission ([place]) - refusing further chemotherapy treatments.    Pancytopenia ([place]) - secondary to her cancer. on neutropenic precautions. Hold lovenox.   Atrial fibrillation ([place]) - currently stable, discontinue home rate controlling medicines   Coronary artery disease. Not on ASA or coagulation.  Essential hypertension -controlled, discontinue home meds  Chronic diastolic congestive heart failure ([facility]) - stable, continue home meds  Type 2 diabetes mellitus ([place]) -controlled, on sliding scale insulin with corresponding glucose checks before meals at bedtime hold glipizide.  * Acute renal failure with dehydration. On Normal saline IV. Hold the Lasix and cozaar. * Recent right leg Cellulitis. Improved, discontinued Augmentin. * History of A. fib. Rate controlled. On sotalol.  * Neutropenic fever. Blood culture and urinalysis was sent.  * C. difficile colitis. Start Flagyl every 8 hours. Contact isolation.  The patient has very poor prognosis. Palliative care consult was done. I discussed with Dr. Megan Salon. The patient will be put on comfort care and discharged to hospice home tomorrow. All the records are reviewed and case discussed with Care Management/Social Workerr. Management plans discussed with the patient, her husband and they are in agreement.  CODE STATUS: DO NOT RESUSCITATE  TOTAL TIME TAKING CARE OF THIS PATIENT: 42 minutes.  Greater than 50% time was spent on  coordination of care and face-to-face counseling.  POSSIBLE D/C IN 1-2 DAYS, DEPENDING ON CLINICAL CONDITION.   Demetrios Loll M.D on 07/18/2015 at 2:38 PM  Between 7am to 6pm - Pager - [phone]  After 6pm go to [email or web address] - password [email or web address] [facility]  [facility]  Office  [phone]  CC: Primary care physician; Adin Hector, MD

## 2015-07-18 NOTE — Progress Notes (Signed)
New hospice home referral received from [facility]. Mrs. Kozlov is a 80 year old woman with a  Known history of CLL, not receiving any treatment. She was admitted to [facility] on 2/7 for evaluation and treatment of increased weakness and symptomatic anemia. Her admission hemoglobin was 7.9 for which she received 4 units of PRBC's. Her hemoglobin today was 4.9. She has  received 1 unit of PRBC's. She is also now positive for C-diff and will receive IV Flagyl.  Patient and her husband have spoken with oncologist Dr. Grayland Ormond, attending physician Dr. Manuella Ghazi and Palliative Medicine physician Dr. Megan Salon and have come to the realiztion that her illness is now at the end stages and can no longer be improved with further blood transfusions. Mr. Laubach has chosen to pursue comfort at the hospice home. Patient is now comfort care. Patient seen lying in bed, eyes closed, roused slightly to name. Several family members present in the room. Writer met with Mr. Cullens in the family room to initiate information regarding hospice services, philosophy and team approach to care with good understanding voiced. Questions answered, consents signed. Information faxed to referral intake. Plan is for patient to transfer to the hospice home tomorrow via EMS with signed portable DNR in place if she remains stable. Writer to follow up with the hospital team in the morning. Thank you for the opportunity to be involved in the care of this patient and her family. Flo Shanks, RN, BSN, [place] and [facility] liaison [phone] c

## 2015-07-18 NOTE — Plan of Care (Signed)
Dr. Curly Rim to inform of CDIFF positive. Pt being placed on enteric precautions.  Dr. Christoper Allegra he and palliative care would discuss if needs treatment before discharging to Hospice.

## 2015-07-18 NOTE — Consult Note (Signed)
Palliative Medicine Inpatient Consult Note   Name: [name] Date: 07/18/2015 MRN: [id number]  DOB: Oct 31, 1935  Referring Physician: Demetrios Loll, MD  Palliative Care consult requested for this 80 y.o. female for goals of medical therapy in patient with Acute Myelogenous Leukemia and C Diff.    BRIEF HISTORY: Pt is medically complex but was recently found to have AML in addition to her chronic medical problems.  She has a declining functional status and is not a candidate for chemo for her leukemia. She has a severe pancytopenia and GI bleeding as well as C Diff colitis.  She cannot be anticoagulated for AFib or Valve Dz, and transfusing her is likely to be entirely futile given the status of her leukemia.   TODAY'S DISCUSSIONS AND PLANS: I have examined pt and spoken with pts husband, [name], his sister, Romie Minus, as well as pt's nurse, and also the Hospice Liaison Nurse.   Pt is on waiting list for Hospice Home now.  She is also now positive for C diff AND she has significant rectal bleeding. I would favor IV Flagyl while she is here and then we can adjust to po or else no treatment for CDiff when she goes to the [place] (once a bed is available).  I will assist with comfort care orders here and will do Med Rec orders soon so she will be ready to go to [facility] when a bed is available. If she continues to bleed, she may not be stable for transfer and then we would have to manage comfort terminal care here.  Husband agrees with these measures.     IMPRESSION: ACUTE C DIFF COLITIS  ---has had before Acute Myelogenous Leukemia ---under no treatment and progressing per Dr Grayland Ormond, oncology ---with worsening hypercalcemia ---Dr Shari Heritage feels there is no option for cure or treatment -----he talked with pts husband and husband agrees with recommendation for comfort care only HTN AFIB Parkinson's Dz Glaucoma Hypothyroidism GERD Dyslipidemia CAD --CABG Valvular Dz   ---mitral valve replaced (mechanical valve) Arrythmias ---pacemaker inserted Pancytopenia related to CLL at end stage Former smoker ---quit about 5 yrs ago Acute on chronic kidney failure Hyperglycemia Rectal bleeding Fever      REVIEW OF SYSTEMS:  Patient is not able to provide ROS due to illness  SPIRITUAL SUPPORT SYSTEM: Yes.  SOCIAL HISTORY:  reports that she has quit smoking. Her smoking use included Cigarettes. She has a 10 pack-year smoking history. She does not have any smokeless tobacco history on file. She reports that she does not drink alcohol or use illicit drugs.  LEGAL DOCUMENTS:  none  CODE STATUS: DNR  PAST MEDICAL HISTORY: Past Medical History  Diagnosis Date  . Atrial fibrillation ([place])   . Parkinson's disease ([place])   . Glaucoma   . Hypertension   . Hypothyroidism   . GERD (gastroesophageal reflux disease)   . High cholesterol   . Diabetes mellitus without complication ([place])   . CAD (coronary artery disease) of bypass graft   . History of appendectomy   . H/O: hysterectomy   . Leukemia ([place])     AML diagnosed 2016- refused treatment  . Rheumatic heart disease     s/p mitral stenosis  . Chronic leukopenia   . Thrombocytopenia ([place])   . Anemia     PAST SURGICAL HISTORY:  Past Surgical History  Procedure Laterality Date  . Cardiac valve replacement      mitral valve replacement with mechanical valve  . Pacemaker insertion    .  Peripheral vascular catheterization N/A 05/10/2015    Procedure: PICC Line Insertion;  Surgeon: Katha Cabal, MD;  Location: [place] [facility];  Service: Cardiovascular;  Laterality: N/A;  . Abdominal hysterectomy    . Appendectomy      ALLERGIES:  is allergic to hydrochlorothiazide and lisinopril.  MEDICATIONS:  Current Facility-Administered Medications  Medication Dose Route Frequency Provider Last Rate Last Dose  . acetaminophen (TYLENOL) tablet 650 mg  650 mg Oral Q6H PRN Lance Coon, MD   650 mg  at 07/17/15 2113   Or  . acetaminophen (TYLENOL) suppository 650 mg  650 mg Rectal Q6H PRN Lance Coon, MD      . metroNIDAZOLE (FLAGYL) IVPB 500 mg  500 mg Intravenous Q6H Demetrios Loll, MD   500 mg at 07/18/15 1610  . ondansetron (ZOFRAN) tablet 4 mg  4 mg Oral Q6H PRN Lance Coon, MD       Or  . ondansetron [facility]) injection 4 mg  4 mg Intravenous Q6H PRN Lance Coon, MD       Facility-Administered Medications Ordered in Other Encounters  Medication Dose Route Frequency Provider Last Rate Last Dose  . 0.9 %  sodium chloride infusion   Intravenous Continuous Lloyd Huger, MD 999 mL/hr at 10/29/14 1550    . heparin flush 10 UNIT/ML injection 10 Units  10 Units Intravenous Once Lloyd Huger, MD      . sodium chloride 0.9 % injection 10 mL  10 mL Intracatheter PRN Lloyd Huger, MD   10 mL at 06/04/15 1609  . sodium chloride 0.9 % injection 10 mL  10 mL Intracatheter PRN Lloyd Huger, MD        Vital Signs: BP 102/60 mmHg  Pulse 68  Temp(Src) 100.6 F (38.1 C) (Oral)  Resp 18  Ht 5\' 3"  (1.6 m)  Wt 52.617 kg (116 lb)  BMI 20.55 kg/m2  SpO2 98% Filed Weights   07/26/2015 0039  Weight: 52.617 kg (116 lb)    Estimated body mass index is 20.55 kg/(m^2) as calculated from the following:   Height as of this encounter: 5\' 3"  (1.6 m).   Weight as of this encounter: 52.617 kg (116 lb).  PERFORMANCE STATUS (ECOG) : 4 - Bedbound  PHYSICAL EXAM: NAD Lying on her side --eyes open rarely OP clear Neck w/o JVD or TM Hrt rrr no m Lungs cta Abd with increased BS Ext no mottling or cyanosis noted  Foul odor noted.  LABS: CBC:    Component Value Date/Time   WBC 0.3* 07/18/2015 0500   WBC 3.9 06/28/2014 1242   HGB 4.9* 07/18/2015 0500   HGB 9.1* 06/28/2014 1242   HCT 14.4* 07/18/2015 0500   HCT 28.3* 06/28/2014 1242   PLT 8* 07/18/2015 0500   PLT 212 06/28/2014 1242   MCV 89.3 07/18/2015 0500   MCV 89 06/28/2014 1242   NEUTROABS 0.0* 07/08/2015 0545    NEUTROABS 2.3 06/28/2014 1242   LYMPHSABS 0.4* 07/08/2015 0545   LYMPHSABS 1.0 06/28/2014 1242   MONOABS 0.0* 07/08/2015 0545   MONOABS 0.1* 06/28/2014 1242   EOSABS 0.0 07/08/2015 0545   EOSABS 0.4 06/28/2014 1242   BASOSABS 0.0 07/08/2015 0545   BASOSABS 0.1 06/28/2014 1242   Comprehensive Metabolic Panel:    Component Value Date/Time   NA 144 07/18/2015 0500   NA 140 06/18/2014 0515   K 3.9 07/18/2015 0500   K 4.1 06/18/2014 0515   CL 117* 07/18/2015 0500   CL 107  06/18/2014 0515   CO2 22 07/18/2015 0500   CO2 25 06/18/2014 0515   BUN 68* 07/18/2015 0500   BUN 13 06/18/2014 0515   CREATININE 1.40* 07/18/2015 0500   CREATININE 1.06 06/18/2014 0515   GLUCOSE 125* 07/18/2015 0500   GLUCOSE 93 06/18/2014 0515   CALCIUM 8.2* 07/18/2015 0500   CALCIUM 9.1 06/18/2014 0515   AST 15 07/06/2015 1316   AST 23 09/19/2013 0533   ALT 8* 07/06/2015 1316   ALT 33 09/19/2013 0533   ALKPHOS 98 07/06/2015 1316   ALKPHOS 81 09/19/2013 0533   BILITOT 1.1 07/06/2015 1316   BILITOT 0.4 09/19/2013 0533   PROT 6.7 07/06/2015 1316   PROT 6.3* 09/19/2013 0533   ALBUMIN 3.2* 07/06/2015 1316   ALBUMIN 2.6* 09/19/2013 0533    More than 50% of the visit was spent in counseling/coordination of care: Yes  Time Spent: 55 minutes

## 2015-07-18 NOTE — Progress Notes (Signed)
Pharmacy Antibiotic Follow-up Note  [name] is a 80 y.o. year-old female admitted on 08/06/2015.  The patient is currently on day  of  for .  Assessment/Plan: Blood cultures growing Gram Negative Rods (E Coli).  KPC not detected.  Pt is currently under palliative care with plans to be transferred to hospice due to advanced AML.  Pt has Cdiff and is currently treated with metronidazole.  CrCl = 27.8 ml/min. BCID results were discussed with Dr. Darvin Neighbours and the patient's antibiotics will be changed to Meropenem 2 gm IV Q12H. Marland Kitchen Further narrowing will be determined based on finalized susceptibilities.  Temp (24hrs), Avg:100 F (37.8 C), Min:98.6 F (37 C), Max:102.3 F (39.1 C)   Recent Labs Lab 07/11/2015 0044 07/13/2015 2211 07/17/15 0506 07/18/15 0500  WBC 0.6* 0.5* 0.4* 0.3*    Recent Labs Lab 07/15/2015 0044 07/13/2015 2211 07/17/15 0506 07/18/15 0500  CREATININE 1.33* 1.12* 1.17* 1.40*   Estimated Creatinine Clearance: 27.8 mL/min (by C-G formula based on Cr of 1.4).    Allergies  Allergen Reactions  . Hydrochlorothiazide Other (See Comments)    Reaction:  Worsening hypercalcaemia  . Lisinopril Other (See Comments) and Cough    Reaction:  Worsening renal insufficiency at 40mg  dose.       Antimicrobials this admission: Flagyl 500 mg IV Q6H. Meropenem 2 gm IV Q12H.   Levels/dose changes this admission:   Microbiology results: 2/09  BCx:  Gram Negative Rods (E Coli).      Thank you for allowing pharmacy to be a part of this patient's care.  Marisela Line D PharmD 07/18/2015 10:22 PM

## 2015-07-18 NOTE — Progress Notes (Signed)
Palliative Care Update  I have examined pt and spoken with pts husband, [name], his sister, Romie Minus, as well as pt's nurse, and also the Hospice Liaison Nurse.    Pt is on waiting list for Hospice Home now.  She is also now positive for C diff  AND she has significant rectal bleeding. I would favor IV Flagyl while she is here and then we can adjust to po or else no treatment for CDiff when she goes to the [facility] (once a bed is available).  I will assist with comfort care orders here and will do Med Rec orders soon so she will be ready to go to [facility] when a bed is available.  If she continues to bleed, she may not be stable for transfer and then we would have to manage comfort terminal care here.     See full note to follow.  Colleen Can, MD Palliative Care

## 2015-07-18 NOTE — Care Management Important Message (Signed)
Important Message  Patient Details  Name: [name] MRN: [id number] Date of Birth: 1935-10-16   Medicare Important Message Given:  Yes    Shelbie Ammons, RN 07/18/2015, 11:59 AM

## 2015-07-18 NOTE — Progress Notes (Signed)
Bcx PCR with E coli. Started on ceftriaxone IV as patient is getting Falgyl. Will need to discucss with patient regarding IV/PO abx prior to DC depending on home vs hospice home.

## 2015-07-18 NOTE — Progress Notes (Signed)
Pt febrile during the night. Tylenol given for temp of 103.2. No improvement. MD notified. Order given to give another 650 mg of Tylenol. Temp still elevated at 102. MD notified again. Order given for Ibuprofen 400 mg. Temp decreased to 99.8.

## 2015-07-18 NOTE — Progress Notes (Signed)
[place]  [phone] [phone]  ID: KERINGTON STEURER OB: 06/27/1935  MR#: [id number]  [id number]  Patient Care Team: Adin Hector, MD as PCP - General (Internal Medicine)  CHIEF COMPLAINT:  Chief Complaint  Patient presents with  . Weakness    INTERVAL HISTORY: Patient's performance status declining and now has evidence of GI bleed. After lengthy discussion with her husband, he is agreed to comfort care measures only.  REVIEW OF SYSTEMS:   Review of Systems  Unable to perform ROS: medical condition    As per HPI. Otherwise, a complete review of systems is negatve.  PAST MEDICAL HISTORY: Past Medical History  Diagnosis Date  . Atrial fibrillation ([place])   . Parkinson's disease ([place])   . Glaucoma   . Hypertension   . Hypothyroidism   . GERD (gastroesophageal reflux disease)   . High cholesterol   . Diabetes mellitus without complication ([place])   . CAD (coronary artery disease) of bypass graft   . History of appendectomy   . H/O: hysterectomy   . Leukemia ([place])     AML diagnosed 2016- refused treatment  . Rheumatic heart disease     s/p mitral stenosis  . Chronic leukopenia   . Thrombocytopenia ([place])   . Anemia     PAST SURGICAL HISTORY: Past Surgical History  Procedure Laterality Date  . Cardiac valve replacement      mitral valve replacement with mechanical valve  . Pacemaker insertion    . Peripheral vascular catheterization N/A 05/10/2015    Procedure: PICC Line Insertion;  Surgeon: Katha Cabal, MD;  Location: [facility];  Service: Cardiovascular;  Laterality: N/A;  . Abdominal hysterectomy    . Appendectomy      FAMILY HISTORY Family History  Problem Relation Age of Onset  . CAD Other   . Heart disease Father        ADVANCED DIRECTIVES:    HEALTH MAINTENANCE: Social History  Substance Use Topics  . Smoking status: Former Smoker -- 1.00 packs/day for 10 years    Types:  Cigarettes  . Smokeless tobacco: None     Comment: quit about 5 years ago  . Alcohol Use: No     Colonoscopy:  PAP:  Bone density:  Lipid panel:  Allergies  Allergen Reactions  . Hydrochlorothiazide Other (See Comments)    Reaction:  Worsening hypercalcaemia  . Lisinopril Other (See Comments) and Cough    Reaction:  Worsening renal insufficiency at 40mg  dose.       Current Facility-Administered Medications  Medication Dose Route Frequency Provider Last Rate Last Dose  . acetaminophen (TYLENOL) tablet 650 mg  650 mg Oral Q6H PRN Lance Coon, MD   650 mg at 07/17/15 2113   Or  . acetaminophen (TYLENOL) suppository 650 mg  650 mg Rectal Q6H PRN Lance Coon, MD      . metroNIDAZOLE (FLAGYL) IVPB 500 mg  500 mg Intravenous Q6H Demetrios Loll, MD   500 mg at 07/18/15 1610  . ondansetron (ZOFRAN) tablet 4 mg  4 mg Oral Q6H PRN Lance Coon, MD       Or  . ondansetron [facility]) injection 4 mg  4 mg Intravenous Q6H PRN Lance Coon, MD       Facility-Administered Medications Ordered in Other Encounters  Medication Dose Route Frequency Provider Last Rate Last Dose  . 0.9 %  sodium chloride infusion   Intravenous Continuous Lloyd Huger, MD 999 mL/hr at 10/29/14  1550    . heparin flush 10 UNIT/ML injection 10 Units  10 Units Intravenous Once Lloyd Huger, MD      . sodium chloride 0.9 % injection 10 mL  10 mL Intracatheter PRN Lloyd Huger, MD   10 mL at 06/04/15 1609  . sodium chloride 0.9 % injection 10 mL  10 mL Intracatheter PRN Lloyd Huger, MD        OBJECTIVE: Filed Vitals:   07/18/15 1140 07/18/15 1441  BP: 90/50 102/60  Pulse: 63 68  Temp: 98.7 F (37.1 C) 100.6 F (38.1 C)  Resp: 18 18     Body mass index is 20.55 kg/(m^2).    ECOG FS:4 - Bedbound  General: Ill-appearing, no acute distress. Lungs: Clear to auscultation bilaterally. Heart: Regular rate and rhythm. No rubs, murmurs, or gallops. Abdomen: Soft, nontender, nondistended. No  organomegaly noted, normoactive bowel sounds. Musculoskeletal: No edema, cyanosis, or clubbing. Neuro: Lethargic. Cranial nerves grossly intact. Skin: No rashes or petechiae noted. Psych: Normal affect.   LAB RESULTS:  Lab Results  Component Value Date   NA 144 07/18/2015   K 3.9 07/18/2015   CL 117* 07/18/2015   CO2 22 07/18/2015   GLUCOSE 125* 07/18/2015   BUN 68* 07/18/2015   CREATININE 1.40* 07/18/2015   CALCIUM 8.2* 07/18/2015   PROT 6.7 07/06/2015   ALBUMIN 3.2* 07/06/2015   AST 15 07/06/2015   ALT 8* 07/06/2015   ALKPHOS 98 07/06/2015   BILITOT 1.1 07/06/2015   GFRNONAA 35* 07/18/2015   GFRAA 41* 07/18/2015    Lab Results  Component Value Date   WBC 0.3* 07/18/2015   NEUTROABS 0.0* 07/08/2015   HGB 4.9* 07/18/2015   HCT 14.4* 07/18/2015   MCV 89.3 07/18/2015   PLT 8* 07/18/2015     STUDIES: Dg Chest 2 View  07/18/2015  CLINICAL DATA:  Fever weakness, history of renal failure, valvular heart disease, CHF, acute myeloid leukemia not in remission. EXAM: CHEST  2 VIEW COMPARISON:  Portable chest x-ray of December 30, 2014 FINDINGS: The lungs are well-expanded. The interstitial markings are increased diffusely. There is no alveolar infiltrate. Areas of patchy confluence are noted in the right lower lung and in both upper lobes. The cardiac silhouette remains enlarged. The pulmonary vascularity is engorged and indistinct. There are CABG changes which are stable. The permanent pacemaker is in stable position. The bony thorax exhibits no acute abnormality. IMPRESSION: Bilateral pulmonary interstitial edema or pneumonia. Underlying CHF. Electronically Signed   By: David  Martinique M.D.   On: 07/18/2015 12:35   US Venous Img Lower Unilateral Right  07/06/2015  CLINICAL DATA:  Right leg swelling EXAM: RIGHT LOWER EXTREMITY VENOUS DOPPLER ULTRASOUND TECHNIQUE: Gray-scale sonography with graded compression, as well as color Doppler and duplex ultrasound were performed to evaluate the  lower extremity deep venous systems from the level of the common femoral vein and including the common femoral, femoral, profunda femoral, popliteal and calf veins including the posterior tibial, peroneal and gastrocnemius veins when visible. The superficial great saphenous vein was also interrogated. Spectral Doppler was utilized to evaluate flow at rest and with distal augmentation maneuvers in the common femoral, femoral and popliteal veins. COMPARISON:  None. FINDINGS: Contralateral Common Femoral Vein: Respiratory phasicity is normal and symmetric with the symptomatic side. No evidence of thrombus. Normal compressibility. Common Femoral Vein: No evidence of thrombus. Normal compressibility, respiratory phasicity and response to augmentation. Saphenofemoral Junction: No evidence of thrombus. Normal compressibility and flow on color Doppler  imaging. Profunda Femoral Vein: No evidence of thrombus. Normal compressibility and flow on color Doppler imaging. Femoral Vein: No evidence of thrombus. Normal compressibility, respiratory phasicity and response to augmentation. Popliteal Vein: No evidence of thrombus. Normal compressibility, respiratory phasicity and response to augmentation. Calf Veins: No evidence of thrombus. Normal compressibility and flow on color Doppler imaging. Superficial Great Saphenous Vein: No evidence of thrombus. Normal compressibility and flow on color Doppler imaging. Venous Reflux:  None. Other Findings:  None. IMPRESSION: No evidence of deep venous thrombosis. Electronically Signed   By: Rolm Baptise M.D.   On: 07/06/2015 15:04    ASSESSMENT: Progressive AML, pancytopenia.  PLAN:    1. AML: Given her worsening pancytopenia, there is suspicion of progression of disease. After lengthy discussion with the husband, he is agreed to comfort care measures only and possibly being discharged with hospice. We will cancel remaining blood transfusion orders. No further intervention is needed.    2. Pancytopenia: Likely secondary to progressive disease. Comfort care as above. No further lab draws. 3. GI bleed: Multifactorial. 4. Disposition: Discharge home with hospice or to hospice home.  DO NOT RESUSCITATE/DO NOT INTUBATE.   Lloyd Huger, MD   07/18/2015 4:20 PM

## 2015-07-18 NOTE — Care Management (Signed)
Spoke with Destiny. [name] concerning Hospice services in the home. States that he is the only person to care for his wife in the home. Requested Marylee Floras, RN representative for [facility] to speak with [name] about what services and long long these services would be in the home if he decides to take her home. [name] would like his wife to be Comfort measures here at [facility]. Would like [name] transferred to the [facility] when there is a bed available. Will update Dr. Haydee Monica RN MSN CCM Care Management   [phone]

## 2015-07-19 LAB — TYPE AND SCREEN
ABO/RH(D): O POS
Antibody Screen: NEGATIVE
UNIT DIVISION: 0
UNIT DIVISION: 0
UNIT DIVISION: 0
UNIT DIVISION: 0
UNIT DIVISION: 0
UNIT DIVISION: 0
Unit division: 0
Unit division: 0
Unit division: 0
Unit division: 0

## 2015-07-20 LAB — CULTURE, BLOOD (ROUTINE X 2)

## 2015-08-07 NOTE — Progress Notes (Signed)
Dr. Bridgett Larsson notified r/t pts death at [phone]

## 2015-08-07 NOTE — Discharge Summary (Signed)
[place] at [place] NAME: [name]    MR#:  [id number]  DATE OF BIRTH:  [birth year]  DATE OF ADMISSION:  07/15/2015 ADMITTING PHYSICIAN: Lance Coon, MD  DATE OF DEATH: 07/21/2015 PRIMARY CARE PHYSICIAN: Tama High III, MD    ADMISSION DIAGNOSIS:  weakness   DISCHARGE DIAGNOSIS:  Severe symptomatic anemia - Likely secondary to progressive disease AML.  Acute myeloid leukemia Pancytopenia   Acute renal failure with dehydration.  Sepsis with bacteremia.  C. difficile colitis.   SECONDARY DIAGNOSIS:   Past Medical History  Diagnosis Date  . Atrial fibrillation ([place])   . Parkinson's disease ([place])   . Glaucoma   . Hypertension   . Hypothyroidism   . GERD (gastroesophageal reflux disease)   . High cholesterol   . Diabetes mellitus without complication ([place])   . CAD (coronary artery disease) of bypass graft   . History of appendectomy   . H/O: hysterectomy   . Leukemia ([place])     AML diagnosed 2016- refused treatment  . Rheumatic heart disease     s/p mitral stenosis  . Chronic leukopenia   . Thrombocytopenia ([place])   . Anemia     HOSPITAL COURSE:   Severe symptomatic anemia - Likely secondary to progressive disease AML. She does not report any acute episodes of bleeding. Hemoglobin down to 4. S/p PRBC transfusion. Hb was up 7.1, but down to 4.9. Ordered 4 units PRBC transfusion. But per Dr. Grayland Ormond, since AMS was worsening, recommended comfort care.   Acute myeloid leukemia not having achieved remission ([place]) - refusing further chemotherapy treatments.    Pancytopenia ([place]) - secondary to her cancer. Was on neutropenic precautions. Hold lovenox.   Atrial fibrillation (HCC) - stable, discontinued home rate controlling medicines   Coronary artery disease. Not on ASA or coagulation.  Essential hypertension -controlled, discontinue home meds  Chronic diastolic congestive heart failure (HCC) - stable,  continue home meds  Type 2 diabetes mellitus ([place]) -controlled, on sliding scale insulin with corresponding glucose checks before meals at bedtime hold glipizide.  * Acute renal failure with dehydration. Was onn Normal saline IV. Hold the Lasix and cozaar. * Recent right leg Cellulitis. Improved, discontinued Augmentin. * History of A. fib. Rate controlled. On sotalol.  * Sepsis with bacteremia. Neutropenic fever. Blood culture: Escherichia coli. She was treated with ceftriaxone IV.  * C. difficile colitis. Started Flagyl every 8 hours and Contact isolation.  The patient had very poor prognosis. Palliative care consult was done. I discussed with Dr. Megan Salon. The patient was put on comfort care. The patient expired at 7:35 this morning.  CODE STATUS: DO NOT RESUSCITATE  DISCHARGE CONDITIONS:   The patient expired at 7:35 am on July 21, 2015.  CONSULTS OBTAINED:  Treatment Team:  Lloyd Huger, MD   Demetrios Loll M.D on July 21, 2015 at 5:57 PM  Between 7am to 6pm - Pager - [phone]  After 6pm go to [email or web address] - password [email or web address] [facility]  [facility]  Office  [phone]  CC: Primary care physician; Adin Hector, MD

## 2015-08-07 NOTE — Progress Notes (Signed)
Patient pronounced deceased at 19 by Isaiah Serge RN ad Mallie Snooks RN.  MD notified, nursing supervisor notified. Family at bedside.

## 2015-08-07 DEATH — deceased

## 2015-11-27 ENCOUNTER — Other Ambulatory Visit: Payer: Self-pay | Admitting: Nurse Practitioner

## 2016-05-24 IMAGING — CR DG CHEST 1V PORT
1 series · 1 of 1 positions shown · non-contrast
Comparison: 06/18/2014

CLINICAL DATA: Weakness and lightheaded since yesterday. Mild
shortness of breath.

EXAM:
PORTABLE CHEST - 1 VIEW

[portable]
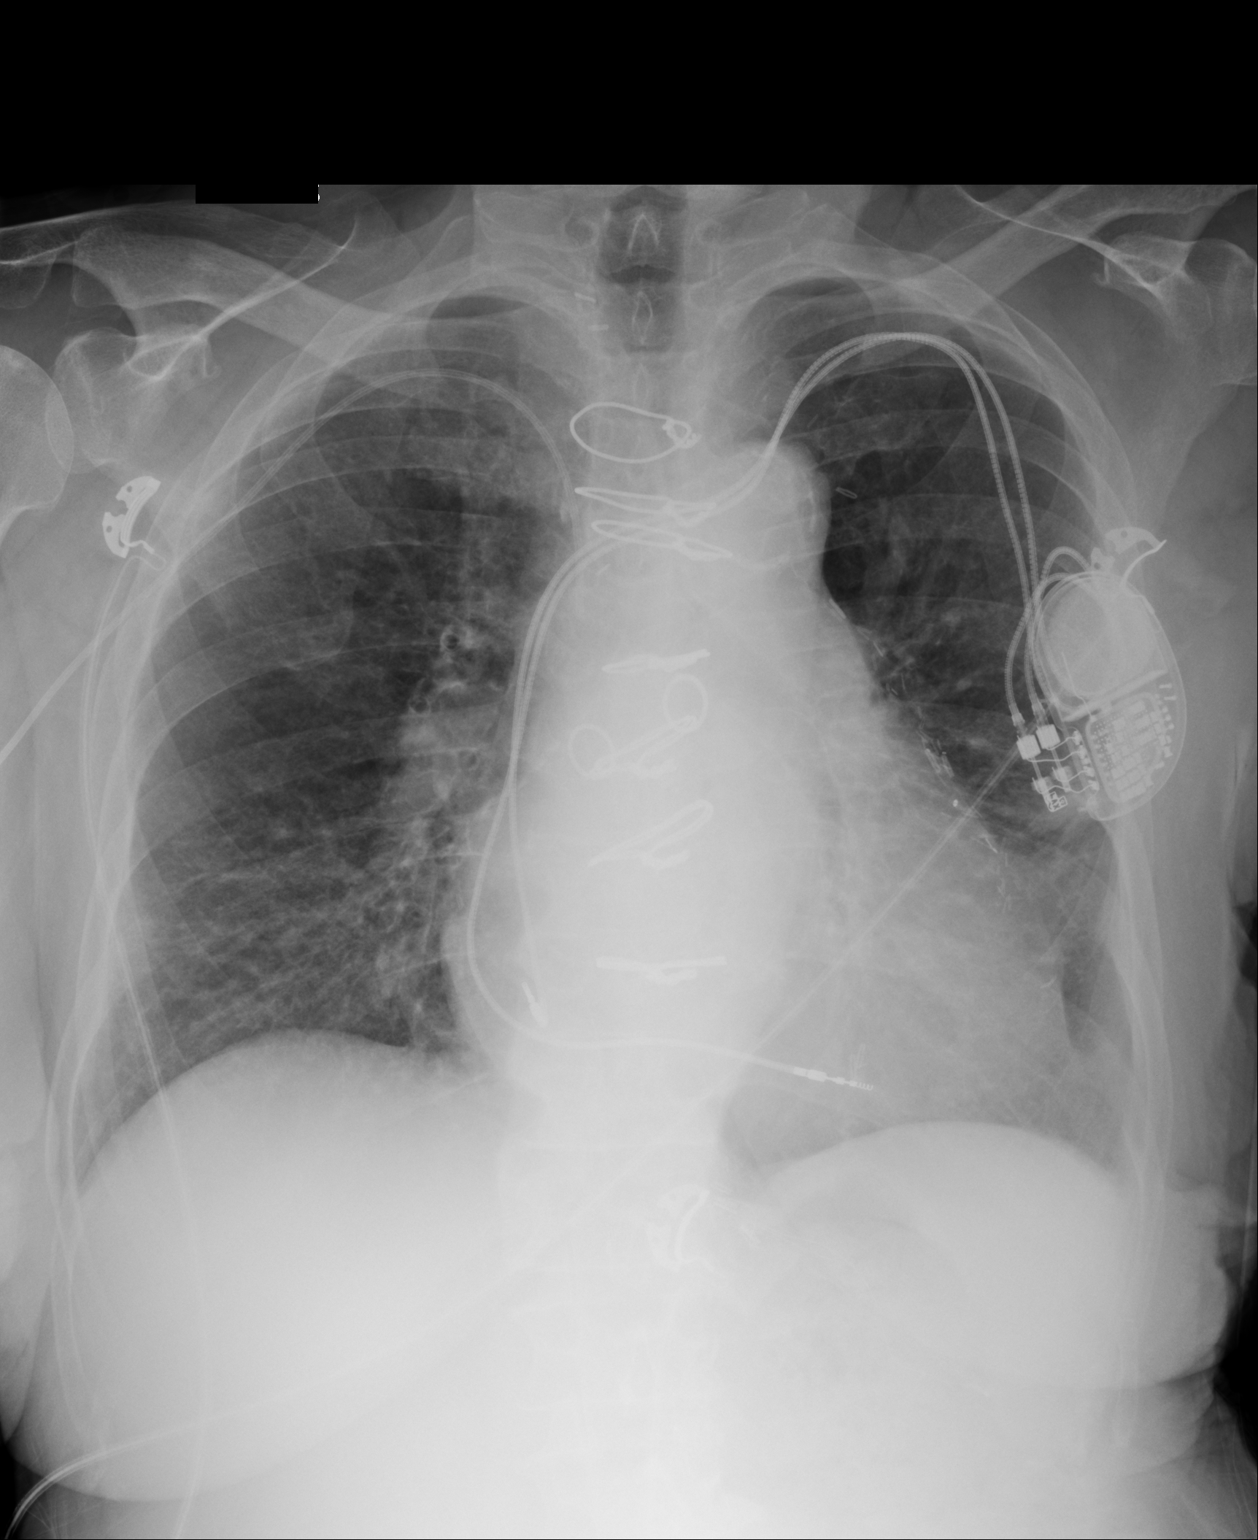

[1 of 1 positions shown; findings below may reference images not displayed]

FINDINGS: Sternotomy wires and left-sided pacemaker unchanged. There is a
right-sided PICC line with tip overlying the expected region of the
SVC. Lungs are adequately inflated with mild stable prominence of
the bronchovascular markings over the right base and infrahilar
regions. No focal consolidation or effusion. Mild stable
cardiomegaly. There is calcified plaque over the aortic arch.
Remainder of the exam is unchanged.
IMPRESSION: No acute cardiopulmonary disease.

Chronic stable prominence of the bronchovascular markings in the
right base and infrahilar regions.

Stable cardiomegaly.

## 2016-05-25 IMAGING — CT CT ABD-PELV W/O CM
1 of 2 series · 15 of 32 positions shown, 19 images · non-contrast
Comparison: 02/27/2011

CLINICAL DATA: Anemia, weakness. History of acute myelogenous
leukemia diagnosed July 2014

EXAM:
CT ABDOMEN AND PELVIS WITHOUT CONTRAST
TECHNIQUE: Multidetector CT imaging of the abdomen and pelvis was performed
following the standard protocol without IV contrast.

[Series 2: routine abd pel without · axial · non-contrast · 0.63mm/px · z∈[-980,-590]mm · 15 of 86 slices shown, 19 images]
[im 4/86  soft-tissue]
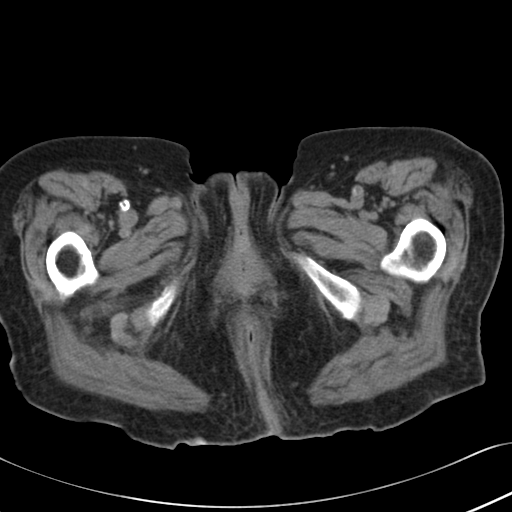
[im 4/86  bone]
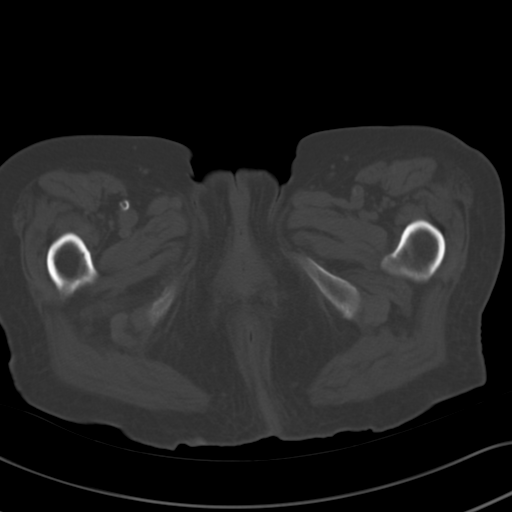
[im 11/86  soft-tissue]
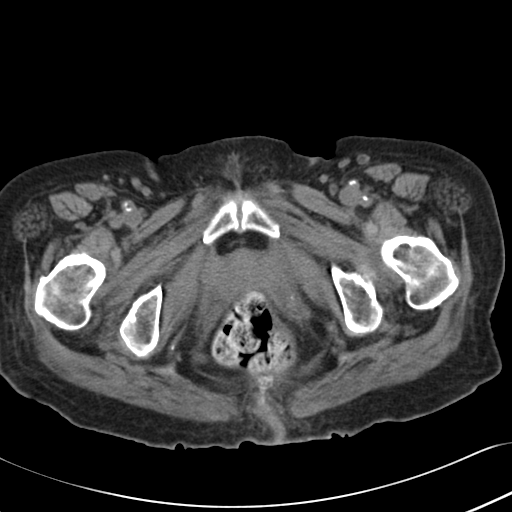
[im 18/86  soft-tissue]
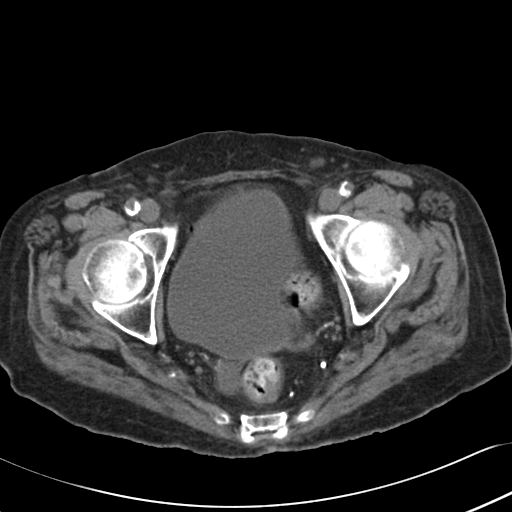
[im 25/86  soft-tissue]
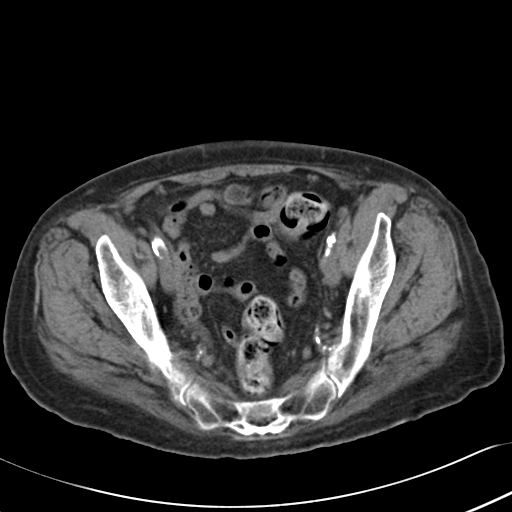
[im 29/86  soft-tissue]
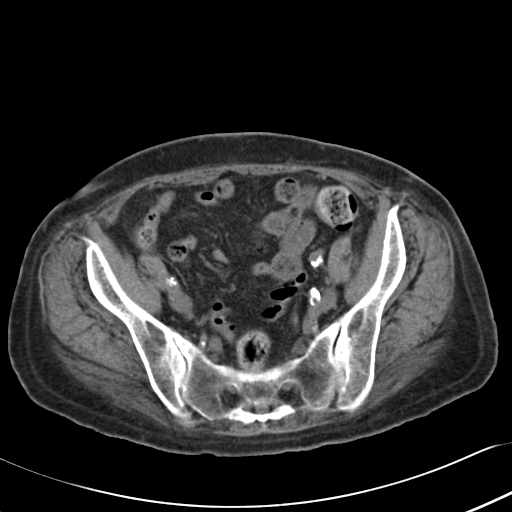
[im 36/86  soft-tissue]
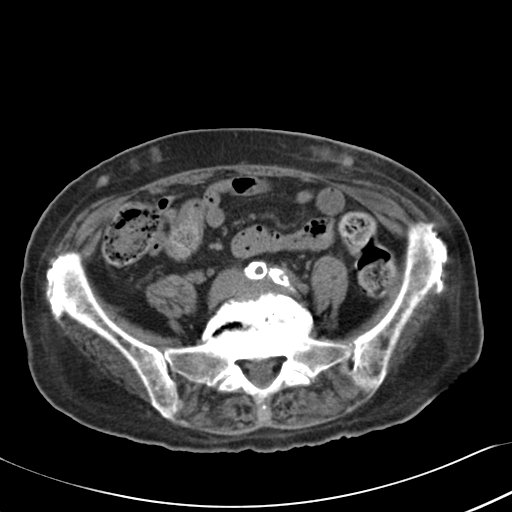
[im 43/86  soft-tissue]
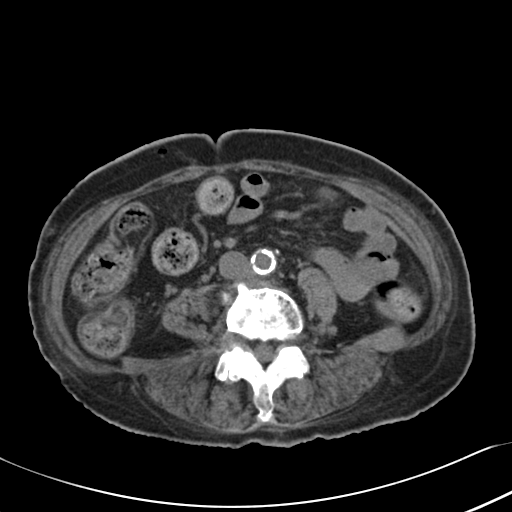
[im 50/86  soft-tissue]
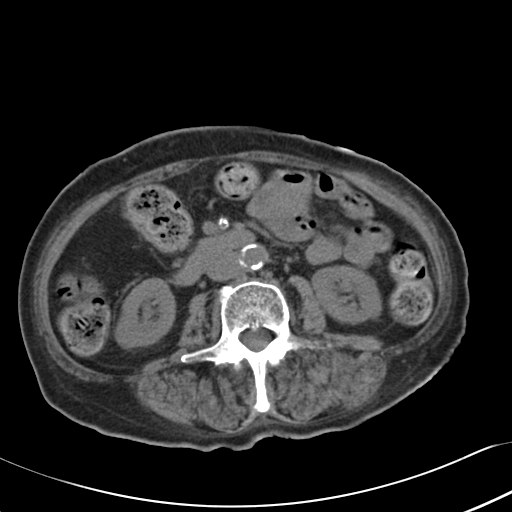
[im 57/86  soft-tissue]
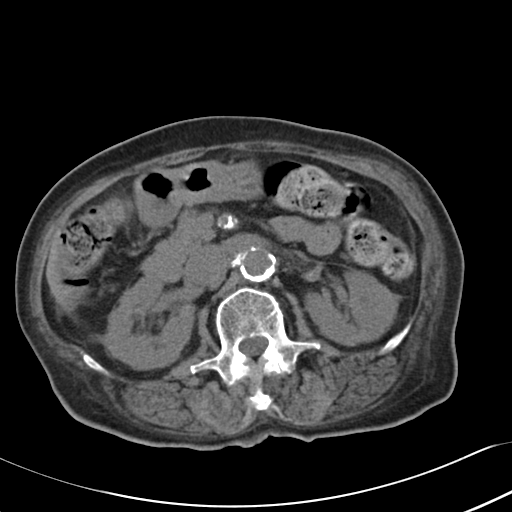
[im 57/86  bone]
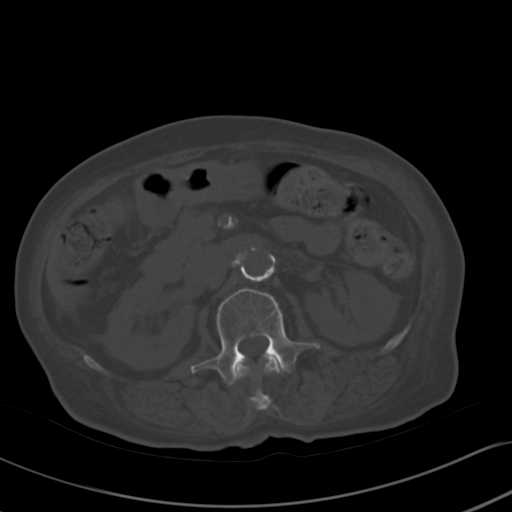
[im 61/86  soft-tissue]
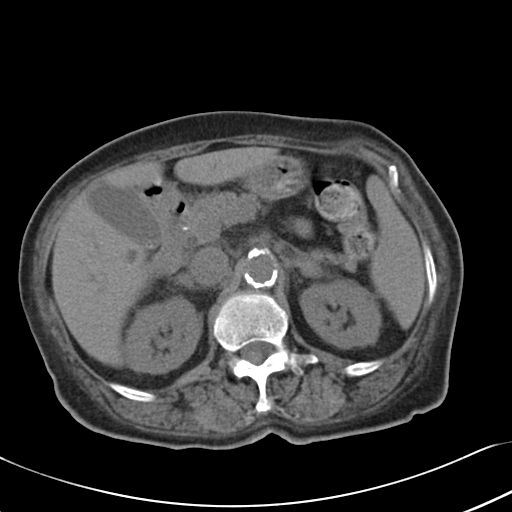
[im 68/86  soft-tissue]
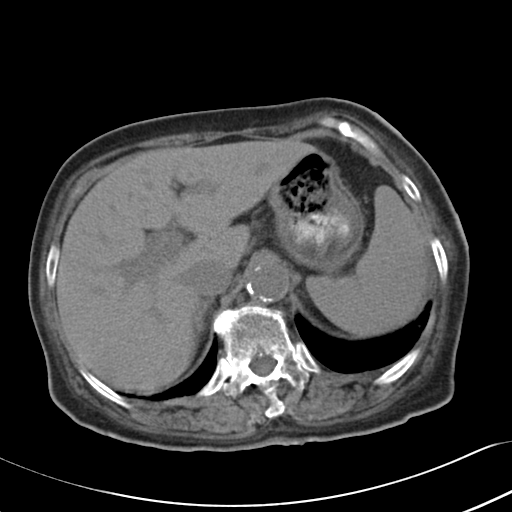
[im 71/86  lung]
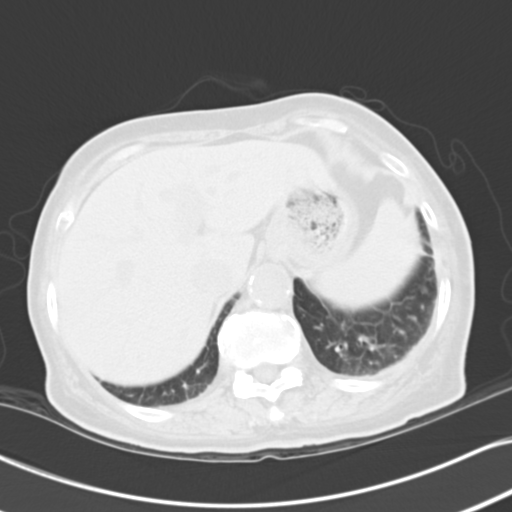
[im 75/86  soft-tissue]
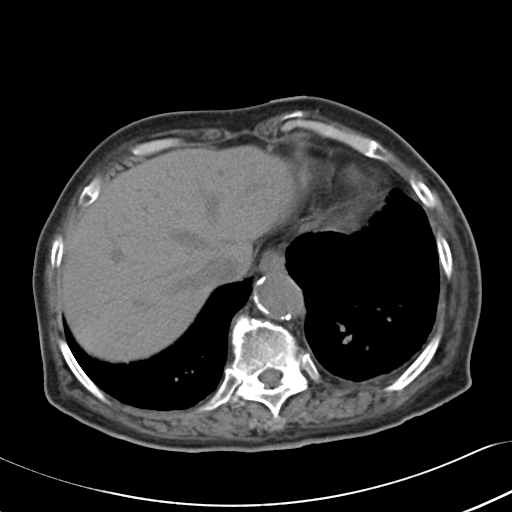
[im 75/86  lung]
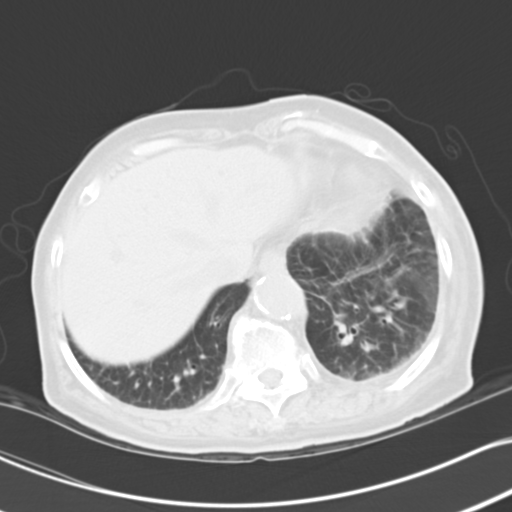
[im 78/86  lung]
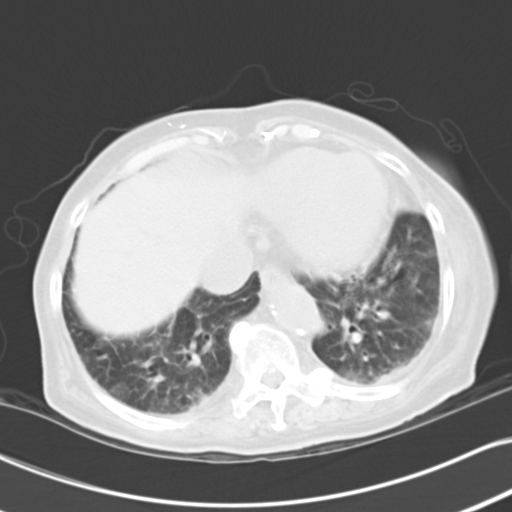
[im 82/86  soft-tissue]
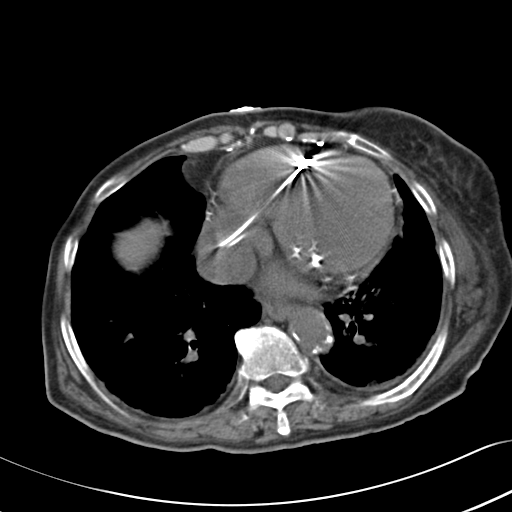
[im 82/86  lung]
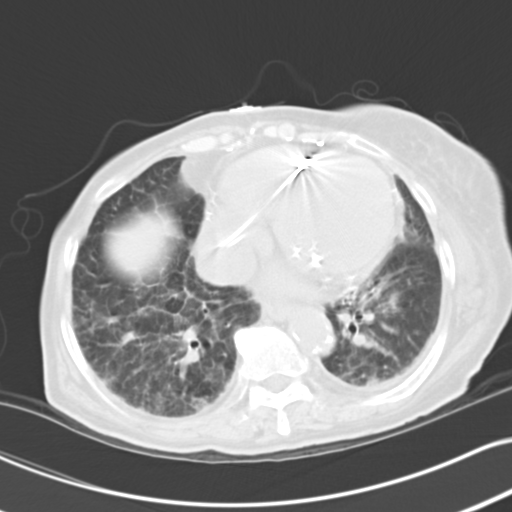

[15 of 32 positions shown; findings below may reference images not displayed]

FINDINGS: Lower chest: Pacer leads partly visualized. Mild cardiomegaly.
Evidence of mitral valvuloplasty. Descending thoracic aortic ectasia
is noted measuring 2.8 cm maximally at the diaphragmatic hiatus
image 21.

Hepatobiliary: 3 mm too small to characterize hypodensity at the
dome of the lateral segment left hepatic lobe identified image 12.
Portal venous distention is noted subjectively. No intrahepatic
ductal dilatation. Dependent gallstones are noted within the
otherwise normal-appearing gallbladder, image 26.

Pancreas: Normal

Spleen: Normal

Adrenals/Urinary Tract: Mild fullness of the adrenal glands noted
bilaterally without measurable mass. Mild nonspecific perinephric
stranding is identified. No hydroureteronephrosis. No radiopaque
renal or ureteral calculi. The bladder is physiologically distended
but otherwise unremarkable.

Stomach/Bowel: Moderate stool burden. Stomach is grossly normal. No
bowel wall thickening or focal segmental dilatation is identified.
The appendix is normal, image 65.

Vascular/Lymphatic: Severe atheromatous aortic calcification without
abdominal aortic aneurysm. No lymphadenopathy.

Reproductive: Uterus and ovaries not visualized.

Other: Small pelvic free fluid is noted. Presumed injection sites
noted within the anterior abdominal wall.

Musculoskeletal: Extensive lumbar spine disc degenerative change is
noted, most prominent at L4-S1.
IMPRESSION: No acute intra-abdominal pathology.
